# Patient Record
Sex: Male | Born: 1960
Health system: Southern US, Community
[De-identification: ages and names within clinical notes are randomized; demographics above are authoritative.]

## PROBLEM LIST (undated history)

## (undated) DIAGNOSIS — K219 Gastro-esophageal reflux disease without esophagitis: Secondary | ICD-10-CM

## (undated) DIAGNOSIS — M25512 Pain in left shoulder: Secondary | ICD-10-CM

## (undated) DIAGNOSIS — M1990 Unspecified osteoarthritis, unspecified site: Secondary | ICD-10-CM

## (undated) DIAGNOSIS — M79606 Pain in leg, unspecified: Secondary | ICD-10-CM

## (undated) DIAGNOSIS — M5416 Radiculopathy, lumbar region: Secondary | ICD-10-CM

## (undated) DIAGNOSIS — M549 Dorsalgia, unspecified: Secondary | ICD-10-CM

## (undated) DIAGNOSIS — Z789 Other specified health status: Secondary | ICD-10-CM

## (undated) DIAGNOSIS — M48061 Spinal stenosis, lumbar region without neurogenic claudication: Secondary | ICD-10-CM

## (undated) DIAGNOSIS — Z87442 Personal history of urinary calculi: Secondary | ICD-10-CM

## (undated) HISTORY — PX: TONSILLECTOMY: SUR1361

## (undated) HISTORY — PX: KNEE SURGERY: SHX244

## (undated) HISTORY — DX: Radiculopathy, lumbar region: M54.16

## (undated) HISTORY — PX: JOINT REPLACEMENT: SHX530

## (undated) HISTORY — DX: Spinal stenosis, lumbar region without neurogenic claudication: M48.061

## (undated) HISTORY — PX: OTHER SURGICAL HISTORY: SHX169

## (undated) HISTORY — DX: Pain in leg, unspecified: M79.606

## (undated) HISTORY — DX: Pain in left shoulder: M25.512

## (undated) HISTORY — PX: EYE SURGERY: SHX253

## (undated) HISTORY — DX: Dorsalgia, unspecified: M54.9

---

## 2002-07-22 ENCOUNTER — Emergency Department (HOSPITAL_COMMUNITY): Admission: EM | Admit: 2002-07-22 | Discharge: 2002-07-22 | Payer: Self-pay | Admitting: Emergency Medicine

## 2002-07-22 ENCOUNTER — Encounter: Payer: Self-pay | Admitting: Emergency Medicine

## 2004-06-19 ENCOUNTER — Ambulatory Visit (HOSPITAL_COMMUNITY): Admission: RE | Admit: 2004-06-19 | Discharge: 2004-06-19 | Payer: Self-pay | Admitting: Orthopaedic Surgery

## 2004-07-03 ENCOUNTER — Ambulatory Visit (HOSPITAL_COMMUNITY): Admission: RE | Admit: 2004-07-03 | Discharge: 2004-07-04 | Payer: Self-pay | Admitting: Orthopaedic Surgery

## 2004-11-16 ENCOUNTER — Emergency Department (HOSPITAL_COMMUNITY): Admission: AD | Admit: 2004-11-16 | Discharge: 2004-11-16 | Payer: Self-pay | Admitting: Family Medicine

## 2004-12-11 ENCOUNTER — Encounter: Admission: RE | Admit: 2004-12-11 | Discharge: 2004-12-11 | Payer: Self-pay | Admitting: Internal Medicine

## 2005-02-25 ENCOUNTER — Emergency Department (HOSPITAL_COMMUNITY): Admission: EM | Admit: 2005-02-25 | Discharge: 2005-02-25 | Payer: Self-pay | Admitting: Emergency Medicine

## 2007-05-01 ENCOUNTER — Ambulatory Visit (HOSPITAL_BASED_OUTPATIENT_CLINIC_OR_DEPARTMENT_OTHER): Admission: RE | Admit: 2007-05-01 | Discharge: 2007-05-01 | Payer: Self-pay | Admitting: Orthopedic Surgery

## 2007-06-22 HISTORY — PX: BACK SURGERY: SHX140

## 2007-09-11 ENCOUNTER — Ambulatory Visit (HOSPITAL_COMMUNITY): Admission: RE | Admit: 2007-09-11 | Discharge: 2007-09-12 | Payer: Self-pay | Admitting: Orthopedic Surgery

## 2007-09-14 ENCOUNTER — Emergency Department (HOSPITAL_COMMUNITY): Admission: EM | Admit: 2007-09-14 | Discharge: 2007-09-15 | Payer: Self-pay | Admitting: Emergency Medicine

## 2009-06-21 HISTORY — PX: BICEPS TENDON REPAIR: SHX566

## 2009-09-10 ENCOUNTER — Encounter: Admission: RE | Admit: 2009-09-10 | Discharge: 2009-09-10 | Payer: Self-pay | Admitting: Orthopaedic Surgery

## 2010-01-19 ENCOUNTER — Observation Stay (HOSPITAL_COMMUNITY): Admission: RE | Admit: 2010-01-19 | Discharge: 2010-01-20 | Payer: Self-pay | Admitting: Orthopedic Surgery

## 2010-09-04 LAB — TYPE AND SCREEN
ABO/RH(D): O NEG
Antibody Screen: NEGATIVE

## 2010-09-04 LAB — BASIC METABOLIC PANEL
GFR calc Af Amer: >60 mL/min (ref >60)
GFR calc non Af Amer: >60 mL/min (ref >60)
Glucose, Bld: 128 mg/dL — ABNORMAL HIGH (ref 70–99)
Sodium: 138 mEq/L (ref 135–145)

## 2010-09-05 LAB — BASIC METABOLIC PANEL
BUN: 16 mg/dL (ref 6–23)
Calcium: 9 mg/dL (ref 8.4–10.5)
Chloride: 108 mEq/L (ref 96–112)
Creatinine, Ser: 0.96 mg/dL (ref 0.4–1.5)
Glucose, Bld: 87 mg/dL (ref 70–99)

## 2010-09-05 LAB — DIFFERENTIAL
Basophils Relative: 0 % (ref 0–1)
Lymphocytes Relative: 24 % (ref 12–46)
Lymphs Abs: 1.5 10*3/uL (ref 0.7–4.0)
Neutro Abs: 4.2 10*3/uL (ref 1.7–7.7)
Neutrophils Relative %: 66 % (ref 43–77)

## 2010-09-05 LAB — CBC
Hemoglobin: 14.7 g/dL (ref 13.0–17.0)
Platelets: 168 10*3/uL (ref 150–400)
RBC: 4.62 MIL/uL (ref 4.22–5.81)
WBC: 6.3 10*3/uL (ref 4.0–10.5)

## 2010-09-05 LAB — SURGICAL PCR SCREEN: Staphylococcus aureus: POSITIVE — AB

## 2010-09-05 LAB — APTT: aPTT: 28 seconds (ref 24–37)

## 2010-09-05 LAB — URINALYSIS, ROUTINE W REFLEX MICROSCOPIC
Bilirubin Urine: NEGATIVE
Glucose, UA: NEGATIVE mg/dL
Hgb urine dipstick: NEGATIVE
Ketones, ur: NEGATIVE mg/dL
Protein, ur: NEGATIVE mg/dL
pH: 5.5 (ref 5.0–8.0)

## 2010-09-05 LAB — PROTIME-INR: INR: 1.07 (ref 0.00–1.49)

## 2010-11-03 NOTE — Consult Note (Signed)
NAMEKESHAWN, FIORITO NO.:  [id number]   MEDICAL RECORD NO.:  [id number]          PATIENT TYPE:  EMS   LOCATION:  ED                           FACILITY:  [facility]   PHYSICIAN:  Alvy Beal, MD    DATE OF BIRTH:  02-14-1961   DATE OF CONSULTATION:  DATE OF DISCHARGE:  09/15/2007                                 CONSULTATION   ORTHOPEDIC SURGEON OF RECORD:  Dr. Lajoyce Corners.   HISTORY:  This is a 50 year old gentleman who contacted me since I was  on-call for the group because of redness and pain in his unilateral  compartmental left knee replacement.   The patient states that 3 days ago he underwent a partial knee  replacement.  He was discharged yesterday and started doing PT today.  He states that later on the course of the evening, he started noticing  some redness in the anterior aspect of the calf and increased pain.  There is no drainage, fevers, or chills.  He contacted me by phone and I  indicated, unfortunately, since I was on call, the only thing I could  recommend if he was truly concerned, was to be seen at the emergency  room or he could contact Dr. Charlann Boxer in the morning.  Because of  progression of his symptoms, he elected to go to the emergency room.  The patient was evaluated in the [facility] ER.   PAST MEDICAL SURGICAL FAMILY SOCIAL HISTORY:  He is a nonsmoker,  nondrinker.  No known drug allergies.  Taking Lovenox, Celebrex,  Hydrocodone, Robaxin, oral iron.  He is otherwise healthy.  No  significant medical problems.   CLINICAL EXAM:  Pleasant gentleman appears stated age in no acute  distress.  He is alert and oriented x3 neurovascularly intact.  No focal  motor or sensory deficits in the lower extremity.  No shortness of breath, chest pain.  ABDOMEN:  Soft and nontender.  He has no hip or ankle pain with isolated joint range of motion.  The  knee wound is clean, dry, and intact.  There is no drainage.  No foul  odor and no significant  effusion.  There is no erythema.  He does have  some tenderness which is to be expected 3 days out from a knee surgery,  but it is not severe debilitating pain with passive or active range of  motion.   At this point in time I do not see any active orthopedic issues.  There  is no evidence of any wound infection or superficial cellulitis.  I will  allow him to return home and he will call Dr. Charlann Boxer in the morning to  discuss further plans.      Alvy Beal, MD  Electronically Signed    DDB/MEDQ  D:  09/15/2007  T:  09/15/2007  Job:  [id number]   cc:   Madlyn Frankel Charlann Boxer, M.D.  Fax: [phone]

## 2010-11-03 NOTE — Op Note (Signed)
NAMEJAMESMICHAEL, SHADD                 ACCOUNT NO.:  [id number]   MEDICAL RECORD NO.:  [id number]          PATIENT TYPE:  AMB   LOCATION:  [facility]                         FACILITY:  [facility]   PHYSICIAN:  Ollen Gross, M.D.    DATE OF BIRTH:  01-Jan-1961   DATE OF PROCEDURE:  05/01/2007  DATE OF DISCHARGE:                               OPERATIVE REPORT   PREOPERATIVE DIAGNOSIS:  Left knee medial meniscal tear and chondral  defect.   POSTOPERATIVE DIAGNOSIS.:  Left knee medial meniscal tear and chondral  defect.   PROCEDURE:  Left knee arthroscopy with meniscal debridement and  chondroplasty.   SURGEON:  Ollen Gross, M.D., no assistant.   ANESTHESIA:  General.   BLOOD LOSS:  Minimal.   DRAIN:  None.   COMPLICATIONS:  None.   CONDITION:  Stable to recovery.   CLINICAL NOTE:  Harry Cobb is a 50 year old male who has had multiple problems  with regards to his left knee.  He had had arthroscopies in the past,  the most recent being in January 2008 with Dr. Ophelia Charter.  He required  injections postoperatively and now has had progressively worsening pain  and dysfunction.  We got an MRI showing recurrent meniscal tear and  chondral irregularity in the medial compartment.  He presents now for  arthroscopy and debridement.   PROCEDURE IN DETAIL:  After successful administration of general  anesthetic, a tourniquet was placed high on the left thigh and left  lower extremity prepped and draped in the usual sterile fashion.  Standard superomedial and inferolateral incisions were made and a flow  cannula passed superomedial and camera passed inferolateral.  Arthroscopic visualization proceeds.  Undersurface of the patella looks  fairly normal.  The trochlea had some grade 2 change centrally, 1 focal  area of grade 3.  There was no unstable cartilage on the trochlea.  Medial and lateral gutters were visualized, and there appears to be a  loose body present tethered to soft tissue in the lateral  gutter.  Flexion and valgus forces then applied to the knee and the medial  compartment centered.  He has evidence of a tear in the body and  posterior horn of the medial meniscus as well as the area of exposed  bone at the medial most margin on the tibia to about a 1 x 1-cm area and  an active chondral delamination from the medial femoral condyle.  A  spinal needle is used to localize the inferomedial portal, small  incision made, dilator placed and then we debride the meniscus back to a  stable base with baskets and a 4.2-mm shaver and sealed it off with the  ArthroCare.  On the tibial surface, there was no evidence of any  unstable cartilage, just that area of exposed bone, most medial at the  joint.  The femoral side, he had about a 1 x 2-cm area where the  cartilage was coming off of bone.  I debrided this back to stable bony  base with stable cartilaginous edges and abraded the bone for hopes of  getting some fibrocartilage to form.  The compartment was again  inspected.  No other tears or loose bodies noted.  Intercondylar notch  was visualized.  The ACL looks normal.  Lateral compartment centered,  and it looks normal.  Went back up into the lateral gutter and then  identified that area again.  We then switched the camera into the medial  compartment and made the working portal lateral.  I found that loose  piece, and we were able to debride the tissue around it, and the suction  removed that piece.  ArthroCare was then used to seal off the area.  Again  inspected the undersurface of the patella and trochlear.  There  was no unstable cartilage.  The joint was again inspected, and there  were no loose bodies.  Arthroscopic equipment was then removed from the  inferior portals which were closed with interrupted 4-0 nylon.  Twenty  mL of 0.25% Marcaine with epinephrine were injected through the inflow  cannula, and then that was removed and that portal closed with nylon.  A  bulky  sterile dressing was applied, and he was awakened and transported  to recovery in stable condition.      Ollen Gross, M.D.  Electronically Signed     FA/MEDQ  D:  05/01/2007  T:  05/02/2007  Job:  [id number]

## 2010-11-03 NOTE — H&P (Signed)
NAME:  PRECILIANO, CASTELL NO.:  [id number]   MEDICAL RECORD NO.:  [id number]          PATIENT TYPE:  INP   LOCATION:                               FACILITY:  [facility]   PHYSICIAN:  Madlyn Frankel. Charlann Boxer, M.D.  DATE OF BIRTH:  Aug 06, 1960   DATE OF ADMISSION:  09/11/2007  DATE OF DISCHARGE:                              HISTORY & PHYSICAL   PROCEDURE:  Left unicondylar knee replacement.   CHIEF COMPLAINT:  Left knee pain.   HISTORY OF PRESENT ILLNESS:  A 50 year old male with a history of left  medial knee pain secondary to medial compartment osteoarthritis.  It has  been refractory to conservative treatment with a history of multiple  arthroscopic surgeries, oral anti-inflammatories, cortisone injections,  and supplementation.  He has been pre-surgically cleared for partial  knee replacement.   PAST MEDICAL HISTORY:  Significant for osteoarthritis.   PAST SURGICAL HISTORIES:  Multiple left knee scopes.  Back surgery and  sinus surgery.  ACL reconstruction right knee.   FAMILY HISTORY:  Noncontributory.   SOCIAL HISTORY:  Married.  Primary caregiver will be his spouse after  surgery.   DRUG ALLERGIES:  No known drug allergies.   MEDICATIONS:  Celebrex 200 mg 1 p.o. b.i.d. for 2 weeks after surgery.   REVIEW OF SYSTEMS:  None other than HPI.   PHYSICAL EXAMINATION:  Pulse 72, respirations 18, blood pressure 130/80.  GENERAL:  Awake, alert and oriented, well-developed, well-nourished in  no acute distress.  NECK:  Supple.  No carotid bruits.  CHEST/LUNGS:  Clear to auscultation bilaterally.  BREASTS:  Deferred.  HEART:  Regular rate and rhythm.  S1-S2 distinct.  ABDOMEN:  Soft, nontender, nondistended.  Bowel sounds present.  GENITOURINARY:  Deferred.  EXTREMITIES:  Dorsalis pedis pulse positive.  Left lower extremity with  medial sided tenderness.  SKIN:  No cellulitis.  NEUROLOGIC:  Intact distal sensibilities.   LABS:  EKG, chest x-ray all pending presurgical  testing.   IMPRESSION:  Left medial knee osteoarthritis.   PLAN OF ACTION:  Left unicondylar knee replacement by surgeon Dr.  Durene Romans at [facility] September 11, 2007.  Risks and  complications were discussed.   Postoperative medications including Lovenox Robaxin iron, aspirin,  Colace, MiraLax provided.  Celebrex presurgical treatment provided as  well as continued use postoperatively.  Pain medicines be provided at  the time of surgery.     ______________________________  Yetta Glassman [address], [place]      Madlyn Frankel. Charlann Boxer, M.D.  Electronically Signed    BLM/MEDQ  D:  09/04/2007  T:  09/04/2007  Job:  [id number]

## 2010-11-03 NOTE — Op Note (Signed)
Harry Cobb, Harry Cobb                 ACCOUNT NO.:  [id number]   MEDICAL RECORD NO.:  [id number]          PATIENT TYPE:  INP   LOCATION:  0003                         FACILITY:  [facility]   PHYSICIAN:  Madlyn Frankel. Charlann Boxer, M.D.  DATE OF BIRTH:  06/06/1961   DATE OF PROCEDURE:  09/11/2007  DATE OF DISCHARGE:                               OPERATIVE REPORT   PREOPERATIVE DIAGNOSIS:  Left knee medial compartment osteoarthritis.   POSTOPERATIVE DIAGNOSIS:  Left knee medial compartment osteoarthritis.  There was noted to be some grade II to III changes in the trochlear  surface.  Patella appeared to be intact.   PROCEDURE:  Left knee partial knee replacement, Biomet Oxford knee size  medium femur, B medial left tibia and a size 4 insert.   SURGEON:  Madlyn Frankel. Charlann Boxer, M.D.   ASSISTANT:  Yetta Glassman. Mann, PA.   ANESTHESIA:  Duramorph spinal.   BLOOD LOSS:  Minimal.   TOURNIQUET TIME:  60 minutes at 250 mmHg.   DRAINS:  One.   COMPLICATIONS:  None.   INDICATIONS FOR PROCEDURE:  Tayson is a 50 year old male who had a history  of left knee problems including previous two arthroscopies with medial  meniscectomy, persistent problems with findings consistent with  subchondral edematous reaction with collapse and progressive arthritic  problems.  Radiographs revealed that he was a candidate for partial knee  replacement with predominant medial compartment arthritis, his age,  activity level and desired to have a normal feeling knee.  We discussed  the risks and benefits of knee replacement surgery including potential  progression of arthritis, the need for revision to a total knee  replacement some time down the road.  Given the desired activity level,  partial knee replacement with maintenance of his ligaments offered him  the best opportunity to remain active, but eliminating the primary  source of the discomfort.  Risks and benefits were discussed and consent  obtained.   PROCEDURE IN DETAIL:   The patient was brought to the operative theater.  Once adequate anesthesia was established and Foley placed, the left leg  was placed into a proximal thigh tourniquet.  The leg holder was then  positioned and landmarks identified.  The left lower extremity was pre  scrubbed, prepped and draped in a sterile fashion.  Leg was  exsanguinated and tourniquet elevated 250 mmHg.  Para midline incision  was made from the patella to the tubercle.  Sharp dissection was carried  down to the extensor mechanism and skin flaps created.  Partial  arthrotomy was created and debridement carried out including partial  medial meniscectomy.  Retractors were placed at this point and I was  able to debride osteophytes off the medial femur as well as in the  notch.  Also evaluated the knee cap area, debrided a little bit of  medial osteophytes off the patella, recognized some grade II to III  changes probably trochlear and patella was relatively intact.   At this point the extramedullary guide was then placed and pinned into  position.  Using the reciprocating saw into the notch, aimed  towards the  hip center.  Then used an oscillating saw to remove the bone cut.  The  bone wear pattern was noted to be consistent with anterior medial wear  pattern consistent with that with an intact ACL.  There was exposed  bone.   I then checked with a size B tibial tray which fit nicely on the cut  surface and the size 4 insert passed with the appropriate amount of  tension with the knee in 90 degrees of flexion without retractors in  place.  At this point I placed the intramedullary rod using the starting  awl to create a hole a centimeter above the PCL insertion medially.  I  then passed the intramedullary rod.  I then placed the B tray with the 3  insert and cut in the guide for the posterior cutting jig.  Once this  was parallel in all planes, I drilled it into position.  The posterior  cutting block was then placed  and the posterior cut was made off the  posterior aspect of the femur.   At this point we put the 0 spigot on and milled down the femur.  Trial  was carried out with a size medium femur, B tibia and a 4 insert.  The  knee felt good at 90 degrees of flexion without retractors.  Had a hard  time getting the 1 into place with the knee at 20 degrees of extension.  Based on this, I chose to use a 4 spigot.  The trials were removed and  the 4 spigot placed and I milled the femur.  After removing osteophytes  and remaining bone off the medial and lateral aspects of the medial  femoral condyle, I placed the trial medium femur, B tibia and the 4  insert.  At this point the ligaments appeared to be very balanced from  extension to flexion.   At this point I removed the bone off the anterior aspect of the medial  femoral condyle just above the milling area to allow for the movement of  the polyethylene.   At this point the final preparation of the tibia was carried out holding  the tibial tray into position.  I pinned it in place.  I then used the  reciprocating saw to create the trough.  I then used the gouge to clean  out the trough and inflate the keeled trial component.  With the trial  that mimicked the final component, there was a couple millimeters of  play at 90 degrees of flexion and 20 degrees of extension indicating  good balance of the knee.  At this point all trial components were  removed.  I debrided remaining bone as necessary, drilled the distal  femur for sclerotic bone present.  Cement was mixed in two batches.  The  tibial component was cemented in first followed by the femur in two  separate technique.  After each technique, I was able to remove the  remaining cement off the posterior aspect of the knee with good  visualization.  Once the cementing technique was carried out again with  two cement batches, I retrialed with the 4 and was happy with it.  The  final 4 insert was  then placed.  I opened it in place.  It was a 4  medial left insert.  It was placed with good pressure.   I reirrigated the knee out at this point.  I did inject the synovial  capsule layer  with 50 mL of 0.25% Marcaine with epinephrine and 1 mL of  Toradol.  A medium Hemovac drain was placed deep.  I irrigated the knee  out and further debridement was carried out as necessary.  At this point  the extensor mechanism was reapproximated with #1 Vicryl.  The remaining  wound was closed with 2-0 Vicryl and running 4-0 Monocryl.  The knee was  cleaned, dried, and dressed sterilely with Steri-Strips and a sterile  bulky wrap.  He was brought to the recovery room in stable condition.  He tolerated the procedure well.      Madlyn Frankel Charlann Boxer, M.D.  Electronically Signed    MDO/MEDQ  D:  09/11/2007  T:  09/11/2007  Job:  [id number]

## 2010-11-06 NOTE — Op Note (Signed)
NAMEASCENCION, COYE                 ACCOUNT NO.:  [id number]   MEDICAL RECORD NO.:  [id number]          PATIENT TYPE:  OIB   LOCATION:  NA                           FACILITY:  [facility]   PHYSICIAN:  Mark C. Ophelia Charter, M.D.    DATE OF BIRTH:  07/01/1960   DATE OF PROCEDURE:  07/03/2004  DATE OF DISCHARGE:                                 OPERATIVE REPORT   PREOPERATIVE DIAGNOSIS:  Left L5-S1 herniated nucleus pulposus.   POSTOPERATIVE DIAGNOSIS:  Left L5-S1 herniated nucleus pulposus.   PROCEDURE:  Left L5-S1 microdiskectomy.   SURGEON:  Mark C. Ophelia Charter, M.D.   ASSISTANT:  Worthy Flank, RNFA   ANESTHESIA:  GOT.   ESTIMATED BLOOD LOSS:  Less than 100 mL.   DRAINS:  None.   DESCRIPTION OF PROCEDURE:  After induction of general anesthesia the patient  was placed on the Andrews frame with preoperative Ancef, kneeling position,  careful padding, cross-table lateral x-ray after DuraPrep. Area was isolated  with towels, Betadine and Vi-Drape and laminectomy sheet was applied.  X-ray  showed that the needle was just barely below the space. Incision was started  at the needle on the left and extended proximally.   Subperiosteal dissection was performed.  A laminotomy was performed at L5.  There was some slight facet overhang, and a small amount of the lamina of S1  was removed.  There was some hypertrophic ligament in the gutter and nerve  root was immediately identified.  It was dorsally displaced and there was a  large disk herniation which was retained by the ligament with the large  bulge.  Annulus was incised with a scalpel on the posterior longitudinal  ligament and immediately giant chunks of herniated nucleus pulposus was seen  with the D'Errico used to retract the nerve root.  Big chunks of disk were  removed.  There was some endplate spurring, more prominent laterally, and  these ridges had to be removed with Kerrison as well as a straight-biting  pituitary.  An Gwyneth Sprout was used in  the midline and also laterally to help  push some degenerative material in.  The disk space had collapsed  considerably and only the micropituitary would fit in the disk space.  There  was mild ridging at the midline and significant ridging was on the left  lateral side which were removed until the spurs were gone and the disk space  and vertebral body above and below were flat for a floor of the head and no  ridging and spurring.  This gave good decompression of the nerve root.  Palpation out the foramina and in the midline was performed.  Other than  some mild ridging of the endplate at the midline, there were no remaining  areas of concern.  Some epidural veins were  coagulated.  The disk space was irrigated, and deep fascia closed with #0  Vicryl, 2-0 Vicryl in the subcutaneous tissue; subcuticular skin closure,  tincture of Benzoin, and Steri-Strips.  Postop dressing applied and the  patient was transferred to the recovery room.  Instrument count and needle  count were correct.       MCY/MEDQ  D:  07/03/2004  T:  07/03/2004  Job:  [id number]

## 2010-12-24 ENCOUNTER — Encounter (INDEPENDENT_AMBULATORY_CARE_PROVIDER_SITE_OTHER): Payer: Self-pay | Admitting: General Surgery

## 2010-12-24 ENCOUNTER — Ambulatory Visit (INDEPENDENT_AMBULATORY_CARE_PROVIDER_SITE_OTHER): Payer: PRIVATE HEALTH INSURANCE | Admitting: General Surgery

## 2010-12-24 VITALS — BP 114/62 | HR 79 | Temp 98.6°F | Ht 71.0 in | Wt 211.0 lb

## 2010-12-24 DIAGNOSIS — K429 Umbilical hernia without obstruction or gangrene: Secondary | ICD-10-CM

## 2010-12-24 NOTE — Progress Notes (Signed)
Addended byEmelia Loron on: 12/24/2010 12:19 PM   Modules accepted: Level of Service

## 2010-12-24 NOTE — Progress Notes (Signed)
Subjective:     Patient ID: Stylianos Stradling, male   DOB: 09/19/1960, 50 y.o.   MRN: [id number]    BP 114/62  Pulse 79  Temp(Src) 98.6 F (37 C) (Temporal)  Ht 5\' 11"  (1.803 m)  Wt 211 lb (95.709 kg)  BMI 29.43 kg/m2    HPI This is a 50 year old healthy male who's had numerous orthopedic surgeries. He presents with about a 4-5 year history of an umbilical hernia. This is remained the same size over that time period he has begun to bother him and cause some pain when he is doing activity. He is an avid golfer and this is when it mostly is affecting him. There's no other real relieving factors except for rest. He comes in today to have this evaluated due to the fact that he 7 increasing symptoms from this. He also has a right shoulder mass that has been there for several years. He says is somewhat more noticeable. There is no pain or any symptoms related to this. He has no history of any infection. No real relieving or aggravating factors exertional symptoms associated with it. He also asked about having this area removed as well.  History reviewed. No pertinent past medical history.  Past Surgical History  Procedure Date  . Knee surgery 2009/2011    Rt and Lt knee replacment  . Back surgery 2009  . Biceps tendon repair 2011    No current outpatient prescriptions on file.  No Known Allergies   Review of Systems  Constitutional: Negative.   HENT: Negative.   Respiratory: Negative.   Cardiovascular: Negative.   Gastrointestinal: Negative.   Genitourinary: Negative.   Musculoskeletal: Positive for arthralgias.  Neurological: Negative.   Hematological: Negative.   Psychiatric/Behavioral: Negative.        Objective:   Physical Exam  Constitutional: He appears well-developed and well-nourished.  Cardiovascular: Normal rate, regular rhythm and normal heart sounds.   Pulmonary/Chest: Effort normal. He has no wheezes. He has no rales.       Right posterior shoulder 1x1.5 cm  soft, mobile mass consistent with lipoma  Abdominal: Soft. Bowel sounds are normal. He exhibits no mass. There is no tenderness. A hernia (mildly tender nonreducible umbilical hernia) is present.       Assessment:     Umbilical hernia Right shoulder lipoma    Plan:        First we discussed the right shoulder lipoma. A series not bothering him. I told him that it would be fine just delete this and monitor this over time he was very happy with that.  We also discussed a symptomatic umbilical hernia. I discussed an open umbilical hernia repair likely with mesh. We discussed the risk being but not limited to bleeding, infection, recurrence and seroma. I discussed with him being 3 weeks without any golf or strenuous activity. He would like to do this sometime after he has a couple of gallstones to play later this summer or fall. I think that will be fine given the chronicity of this hernia. He did say he would call me sooner if he developed any more symptoms or any problems.

## 2010-12-24 NOTE — Patient Instructions (Signed)
Call to schedule surgery.  Call sooner if any problems or additional questions.

## 2011-02-25 ENCOUNTER — Telehealth (INDEPENDENT_AMBULATORY_CARE_PROVIDER_SITE_OTHER): Payer: Self-pay | Admitting: General Surgery

## 2011-02-26 ENCOUNTER — Telehealth (INDEPENDENT_AMBULATORY_CARE_PROVIDER_SITE_OTHER): Payer: Self-pay | Admitting: General Surgery

## 2011-02-26 NOTE — Telephone Encounter (Signed)
Patient's wife called re: recovery time after umbilical hernia repair and how early in the week to schedule surgery to be able to travel on Saturday to a football game. I advised the wife that everyone heals differently and there is no guarantee that he will feel like traveling right after surgery. I advised if he did, to schedule surgery for as early in the week as possible and to make sure and get out and walk around half way through the trip. She was grateful for the advise and she will call Amy to schedule surgery when they are ready.

## 2011-03-15 LAB — DIFFERENTIAL
Basophils Absolute: 0
Basophils Relative: 1
Basophils Relative: 1
Eosinophils Absolute: 0.2
Eosinophils Relative: 6 — ABNORMAL HIGH
Lymphocytes Relative: 37
Lymphocytes Relative: 38
Lymphs Abs: 2
Monocytes Relative: 7
Neutrophils Relative %: 50

## 2011-03-15 LAB — URINALYSIS, ROUTINE W REFLEX MICROSCOPIC
Glucose, UA: NEGATIVE
Hgb urine dipstick: NEGATIVE
Ketones, ur: NEGATIVE
Protein, ur: NEGATIVE
Specific Gravity, Urine: 1.024

## 2011-03-15 LAB — CBC
HCT: 38.6 — ABNORMAL LOW
MCHC: 35.4
MCV: 87.9
Platelets: 130 — ABNORMAL LOW
Platelets: 154
Platelets: 174
RDW: 12.4
RDW: 13
RDW: 13.2
WBC: 5
WBC: 5.3

## 2011-03-15 LAB — BASIC METABOLIC PANEL
BUN: 13
BUN: 15
BUN: 16
CO2: 28
Calcium: 7.8 — ABNORMAL LOW
Calcium: 8.9
Chloride: 104
Creatinine, Ser: 0.94
GFR calc Af Amer: >60
GFR calc Af Amer: >60
GFR calc non Af Amer: >60
GFR calc non Af Amer: >60
Glucose, Bld: 122 — ABNORMAL HIGH
Glucose, Bld: 137 — ABNORMAL HIGH
Potassium: 3.6
Potassium: 4
Sodium: 137

## 2011-03-15 LAB — TYPE AND SCREEN
ABO/RH(D): O NEG
Antibody Screen: NEGATIVE

## 2011-03-15 LAB — APTT: aPTT: 27

## 2011-03-30 LAB — POCT HEMOGLOBIN-HEMACUE: Operator id: 268271

## 2011-04-22 HISTORY — PX: KNEE ARTHROSCOPY: SUR90

## 2011-04-28 ENCOUNTER — Other Ambulatory Visit: Payer: Self-pay | Admitting: Rheumatology

## 2011-04-28 ENCOUNTER — Ambulatory Visit
Admission: RE | Admit: 2011-04-28 | Discharge: 2011-04-28 | Disposition: A | Discharge status: Home / Self Care | Payer: PRIVATE HEALTH INSURANCE | Source: Ambulatory Visit | Attending: Rheumatology | Admitting: Rheumatology

## 2011-04-28 DIAGNOSIS — M2550 Pain in unspecified joint: Secondary | ICD-10-CM

## 2011-06-01 ENCOUNTER — Encounter (HOSPITAL_BASED_OUTPATIENT_CLINIC_OR_DEPARTMENT_OTHER): Payer: Self-pay | Admitting: *Deleted

## 2011-06-01 ENCOUNTER — Other Ambulatory Visit (INDEPENDENT_AMBULATORY_CARE_PROVIDER_SITE_OTHER): Payer: Self-pay | Admitting: General Surgery

## 2011-06-01 NOTE — Progress Notes (Signed)
No labs per anesth needed-waiting to see if dr Dwain Sarna wants labs

## 2011-06-03 ENCOUNTER — Encounter (HOSPITAL_BASED_OUTPATIENT_CLINIC_OR_DEPARTMENT_OTHER)
Admission: RE | Admit: 2011-06-03 | Discharge: 2011-06-03 | Disposition: A | Discharge status: Home / Self Care | Payer: PRIVATE HEALTH INSURANCE | Source: Ambulatory Visit | Attending: General Surgery | Admitting: General Surgery

## 2011-06-03 LAB — BASIC METABOLIC PANEL
BUN: 25 mg/dL — ABNORMAL HIGH (ref 6–23)
Calcium: 9 mg/dL (ref 8.4–10.5)
Creatinine, Ser: 0.82 mg/dL (ref 0.50–1.35)
GFR calc Af Amer: >90 mL/min (ref >90)
GFR calc non Af Amer: >90 mL/min (ref >90)
Glucose, Bld: 96 mg/dL (ref 70–99)
Potassium: 4.4 mEq/L (ref 3.5–5.1)

## 2011-06-03 LAB — CBC
Hemoglobin: 14.3 g/dL (ref 13.0–17.0)
MCH: 31.2 pg (ref 26.0–34.0)
MCHC: 35.3 g/dL (ref 30.0–36.0)
Platelets: 140 10*3/uL — ABNORMAL LOW (ref 150–400)
RDW: 12.7 % (ref 11.5–15.5)

## 2011-06-04 ENCOUNTER — Ambulatory Visit (HOSPITAL_BASED_OUTPATIENT_CLINIC_OR_DEPARTMENT_OTHER)
Admission: RE | Admit: 2011-06-04 | Discharge: 2011-06-04 | Disposition: A | Discharge status: Home / Self Care | Payer: PRIVATE HEALTH INSURANCE | Source: Ambulatory Visit | Attending: General Surgery | Admitting: General Surgery

## 2011-06-04 ENCOUNTER — Encounter (HOSPITAL_BASED_OUTPATIENT_CLINIC_OR_DEPARTMENT_OTHER): Payer: Self-pay | Admitting: Certified Registered Nurse Anesthetist

## 2011-06-04 ENCOUNTER — Ambulatory Visit (HOSPITAL_BASED_OUTPATIENT_CLINIC_OR_DEPARTMENT_OTHER): Payer: PRIVATE HEALTH INSURANCE | Admitting: Certified Registered Nurse Anesthetist

## 2011-06-04 ENCOUNTER — Other Ambulatory Visit (INDEPENDENT_AMBULATORY_CARE_PROVIDER_SITE_OTHER): Payer: Self-pay | Admitting: General Surgery

## 2011-06-04 ENCOUNTER — Encounter (HOSPITAL_BASED_OUTPATIENT_CLINIC_OR_DEPARTMENT_OTHER)
Admission: RE | Disposition: A | Discharge status: Home / Self Care | Payer: Self-pay | Source: Ambulatory Visit | Attending: General Surgery

## 2011-06-04 ENCOUNTER — Other Ambulatory Visit: Payer: Self-pay

## 2011-06-04 ENCOUNTER — Encounter (HOSPITAL_BASED_OUTPATIENT_CLINIC_OR_DEPARTMENT_OTHER): Payer: Self-pay | Admitting: *Deleted

## 2011-06-04 DIAGNOSIS — D1739 Benign lipomatous neoplasm of skin and subcutaneous tissue of other sites: Secondary | ICD-10-CM | POA: Insufficient documentation

## 2011-06-04 DIAGNOSIS — Z01812 Encounter for preprocedural laboratory examination: Secondary | ICD-10-CM | POA: Insufficient documentation

## 2011-06-04 DIAGNOSIS — K420 Umbilical hernia with obstruction, without gangrene: Secondary | ICD-10-CM | POA: Insufficient documentation

## 2011-06-04 DIAGNOSIS — K429 Umbilical hernia without obstruction or gangrene: Secondary | ICD-10-CM

## 2011-06-04 HISTORY — DX: Other specified health status: Z78.9

## 2011-06-04 HISTORY — PX: OTHER SURGICAL HISTORY: SHX169

## 2011-06-04 HISTORY — PX: UMBILICAL HERNIA REPAIR: SHX196

## 2011-06-04 HISTORY — DX: Unspecified osteoarthritis, unspecified site: M19.90

## 2011-06-04 SURGERY — REPAIR, HERNIA, UMBILICAL, ADULT
Anesthesia: General | Site: Abdomen | Wound class: Clean

## 2011-06-04 MED ORDER — HYDROMORPHONE HCL PF 1 MG/ML IJ SOLN
0.2500 mg | INTRAMUSCULAR | Status: DC | PRN
  Administered 2011-06-04: 0.25 mg via INTRAVENOUS
  Administered 2011-06-04: 0.5 mg via INTRAVENOUS
  Administered 2011-06-04: 0.25 mg via INTRAVENOUS

## 2011-06-04 MED ORDER — LIDOCAINE HCL (CARDIAC) 20 MG/ML IV SOLN
INTRAVENOUS | Status: DC | PRN
  Administered 2011-06-04: 60 mg via INTRAVENOUS

## 2011-06-04 MED ORDER — CEFAZOLIN SODIUM-DEXTROSE 2-3 GM-% IV SOLR
2.0000 g | INTRAVENOUS | Status: AC
  Administered 2011-06-04: 2 g via INTRAVENOUS

## 2011-06-04 MED ORDER — OXYCODONE-ACETAMINOPHEN 5-325 MG PO TABS
2.0000 | ORAL_TABLET | Freq: Once | ORAL | Status: AC
  Administered 2011-06-04: 2 via ORAL

## 2011-06-04 MED ORDER — FENTANYL CITRATE 0.05 MG/ML IJ SOLN
INTRAMUSCULAR | Status: DC | PRN
  Administered 2011-06-04: 100 ug via INTRAVENOUS
  Administered 2011-06-04: 50 ug via INTRAVENOUS

## 2011-06-04 MED ORDER — MIDAZOLAM HCL 5 MG/5ML IJ SOLN
INTRAMUSCULAR | Status: DC | PRN
  Administered 2011-06-04: 2 mg via INTRAVENOUS

## 2011-06-04 MED ORDER — CEFAZOLIN SODIUM 1-5 GM-% IV SOLN: 1.0000 g | INTRAVENOUS | Status: DC

## 2011-06-04 MED ORDER — OXYCODONE-ACETAMINOPHEN 10-325 MG PO TABS: 1.0000 | ORAL_TABLET | Freq: Four times a day (QID) | ORAL | Status: AC | PRN

## 2011-06-04 MED ORDER — ONDANSETRON HCL 4 MG/2ML IJ SOLN: 4.0000 mg | Freq: Once | INTRAMUSCULAR | Status: DC | PRN

## 2011-06-04 MED ORDER — DEXAMETHASONE SODIUM PHOSPHATE 4 MG/ML IJ SOLN
INTRAMUSCULAR | Status: DC | PRN
  Administered 2011-06-04: 10 mg via INTRAVENOUS

## 2011-06-04 MED ORDER — LACTATED RINGERS IV SOLN
INTRAVENOUS | Status: DC
  Administered 2011-06-04 (×2): via INTRAVENOUS

## 2011-06-04 MED ORDER — MEPERIDINE HCL 25 MG/ML IJ SOLN: 6.2500 mg | INTRAMUSCULAR | Status: DC | PRN

## 2011-06-04 MED ORDER — ONDANSETRON HCL 4 MG/2ML IJ SOLN
INTRAMUSCULAR | Status: DC | PRN
  Administered 2011-06-04: 4 mg via INTRAVENOUS

## 2011-06-04 MED ORDER — BUPIVACAINE HCL (PF) 0.25 % IJ SOLN
INTRAMUSCULAR | Status: DC | PRN
  Administered 2011-06-04: 10 mL

## 2011-06-04 SURGICAL SUPPLY — 44 items
BENZOIN TINCTURE PRP APPL 2/3 (GAUZE/BANDAGES/DRESSINGS) ×2 IMPLANT
BLADE SURG 15 STRL LF DISP TIS (BLADE) ×1 IMPLANT
BLADE SURG 15 STRL SS (BLADE) ×1
BLADE SURG ROTATE 9660 (MISCELLANEOUS) ×2 IMPLANT
CHLORAPREP W/TINT 26ML (MISCELLANEOUS) ×2 IMPLANT
CLOTH BEACON ORANGE TIMEOUT ST (SAFETY) ×2 IMPLANT
COVER MAYO STAND STRL (DRAPES) ×2 IMPLANT
COVER TABLE BACK 60X90 (DRAPES) ×2 IMPLANT
DECANTER SPIKE VIAL GLASS SM (MISCELLANEOUS) IMPLANT
DERMABOND ADVANCED (GAUZE/BANDAGES/DRESSINGS) ×1
DERMABOND ADVANCED .7 DNX12 (GAUZE/BANDAGES/DRESSINGS) ×1 IMPLANT
DRAPE PED LAPAROTOMY (DRAPES) ×2 IMPLANT
DRSG TEGADERM 4X4.75 (GAUZE/BANDAGES/DRESSINGS) ×2 IMPLANT
ELECT COATED BLADE 2.86 ST (ELECTRODE) ×2 IMPLANT
ELECT REM PT RETURN 9FT ADLT (ELECTROSURGICAL) ×2
ELECTRODE REM PT RTRN 9FT ADLT (ELECTROSURGICAL) ×1 IMPLANT
GLOVE BIO SURGEON STRL SZ 6.5 (GLOVE) ×2 IMPLANT
GLOVE BIO SURGEON STRL SZ7 (GLOVE) ×2 IMPLANT
GLOVE BIOGEL PI IND STRL 7.0 (GLOVE) ×1 IMPLANT
GLOVE BIOGEL PI IND STRL 7.5 (GLOVE) ×1 IMPLANT
GLOVE BIOGEL PI INDICATOR 7.0 (GLOVE) ×1
GLOVE BIOGEL PI INDICATOR 7.5 (GLOVE) ×1
GOWN PREVENTION PLUS XLARGE (GOWN DISPOSABLE) ×4 IMPLANT
NEEDLE HYPO 22GX1.5 SAFETY (NEEDLE) ×2 IMPLANT
NS IRRIG 1000ML POUR BTL (IV SOLUTION) ×2 IMPLANT
PACK BASIN DAY SURGERY FS (CUSTOM PROCEDURE TRAY) ×2 IMPLANT
PATCH VENTRAL SMALL 4.3 (Mesh Specialty) ×2 IMPLANT
PENCIL BUTTON HOLSTER BLD 10FT (ELECTRODE) ×2 IMPLANT
SLEEVE SCD COMPRESS KNEE MED (MISCELLANEOUS) ×2 IMPLANT
SPONGE GAUZE 2X2 8PLY STRL LF (GAUZE/BANDAGES/DRESSINGS) ×2 IMPLANT
SPONGE LAP 4X18 X RAY DECT (DISPOSABLE) ×2 IMPLANT
STRIP CLOSURE SKIN 1/2X4 (GAUZE/BANDAGES/DRESSINGS) ×2 IMPLANT
SUT ETHIBOND 0 MO6 C/R (SUTURE) ×2 IMPLANT
SUT MNCRL AB 4-0 PS2 18 (SUTURE) ×2 IMPLANT
SUT VIC AB 0 SH 27 (SUTURE) IMPLANT
SUT VIC AB 2-0 SH 27 (SUTURE)
SUT VIC AB 2-0 SH 27XBRD (SUTURE) IMPLANT
SUT VIC AB 3-0 SH 27 (SUTURE) ×1
SUT VIC AB 3-0 SH 27X BRD (SUTURE) ×1 IMPLANT
SUT VICRYL AB 3 0 TIES (SUTURE) IMPLANT
SYR CONTROL 10ML LL (SYRINGE) ×2 IMPLANT
TOWEL OR 17X24 6PK STRL BLUE (TOWEL DISPOSABLE) ×2 IMPLANT
TOWEL OR NON WOVEN STRL DISP B (DISPOSABLE) ×2 IMPLANT
WATER STERILE IRR 1000ML POUR (IV SOLUTION) IMPLANT

## 2011-06-04 NOTE — Anesthesia Procedure Notes (Signed)
Procedure Name: LMA Insertion Date/Time: 06/04/2011 7:48 AM Performed by: Jonan Seufert D Pre-anesthesia Checklist: Patient identified, Emergency Drugs available, Suction available and Patient being monitored Patient Re-evaluated:Patient Re-evaluated prior to inductionOxygen Delivery Method: Circle System Utilized Preoxygenation: Pre-oxygenation with 100% oxygen Intubation Type: IV induction Ventilation: Mask ventilation without difficulty LMA: LMA with gastric port inserted LMA Size: 4.0 Number of attempts: 1 Placement Confirmation: positive ETCO2 Tube secured with: Tape Dental Injury: Teeth and Oropharynx as per pre-operative assessment

## 2011-06-04 NOTE — Transfer of Care (Signed)
Immediate Anesthesia Transfer of Care Note  Patient: Harry Cobb  Procedure(s) Performed:  HERNIA REPAIR UMBILICAL ADULT - umbilical hernia repiar and mesh and removal of lympoma on right shoulder  Patient Location: PACU  Anesthesia Type: General  Level of Consciousness: awake, alert , oriented and patient cooperative  Airway & Oxygen Therapy: Patient Spontanous Breathing and Patient connected to face mask oxygen  Post-op Assessment: Report given to PACU RN and Post -op Vital signs reviewed and stable  Post vital signs: Reviewed and stable  Complications: No apparent anesthesia complications

## 2011-06-04 NOTE — Anesthesia Preprocedure Evaluation (Addendum)
Anesthesia Evaluation  Patient identified by MRN, date of birth, ID band Patient awake    Reviewed: Allergy & Precautions, H&P , NPO status , Patient's Chart, lab work & pertinent test results  Airway Mallampati: II TM Distance: >3 FB Neck ROM: full    Dental   Pulmonary          Cardiovascular     Neuro/Psych    GI/Hepatic   Endo/Other    Renal/GU      Musculoskeletal   Abdominal   Peds  Hematology   Anesthesia Other Findings   Reproductive/Obstetrics                          Anesthesia Physical Anesthesia Plan  ASA: II  Anesthesia Plan: General LMA   Post-op Pain Management:    Induction:   Airway Management Planned:   Additional Equipment:   Intra-op Plan:   Post-operative Plan:   Informed Consent: I have reviewed the patients History and Physical, chart, labs and discussed the procedure including the risks, benefits and alternatives for the proposed anesthesia with the patient or authorized representative who has indicated his/her understanding and acceptance.     Plan Discussed with: CRNA and Surgeon  Anesthesia Plan Comments:         Anesthesia Quick Evaluation  

## 2011-06-04 NOTE — Op Note (Signed)
Preoperative diagnosis: #1 symptomatic umbilical hernia repair  #2 a 2 x 2 centimeters subcutaneous right shoulder lipoma Postoperative diagnosis: Same as above  Procedure: #1 umbilical hernia repair with proceed ventral patch, 4.4 cm #2 excision of right shoulder lipoma  Surgeon: Dr. Harden Mo Anesthesia: Gen. With LMA Estimated blood loss: Minimal Consultations: None Specimens: Right shoulder lipoma to pathology Position of patient to recovery room in stable condition Sponge needle counts were correct x2 at end of operation  Indications: This is a 50 year old male who is at a symptomatic enlarging umbilical hernia. We discussed repair with mesh. He also has we could excise the right shoulder lipoma at the same time. This area has been enlarging to plan on doing both at the same time.   procedure: After informed consent was obtained the patient was taken to the operating room.He had 2 g of intravenous cefazolin administered.sequential compression devices were placed on his legs prior to induction of anesthesia. He was administered general anesthesia with an LMA. His abdomen was then prepped and draped in standard sterile surgical fashion. A surgical timeout was performed.  I then made a curvilinear incision below his umbilicus and carried this down to the umbilical stalk. This does have some preperitoneal fat incarcerated in it. This was then divided with cautery. I then reduced the hernia in its entirety. This was about a 1.2 cm defect. Due to his body habitus I elected to place a piece of mesh for repair. I used the procedure ventral patch in place this into the preperitoneal space and laid the bottom portion of the mesh flat. This was then secured with 0 Ethibond suture. I then closed the defect overlying this with 0 Ethibond sutures well. This completely obliterated the defect. I then tacked his umbilicus now with undyed 2-0 Vicryl. I then closes with 3-0 Vicryl and 4 Monocryl.  Steri-Strips and sterile dressing were placed. I infiltrated a total of 10 cc of quarter percent Marcaine throughout this incision.  We then broke to sit down and reprepped and draped his right shoulder. I made a incision overlying the right shoulder lipoma. Cautery was then used to remove this mass in its entirety. This was then passed off the table as a specimen. Hemostasis was observed. I infiltrated again at 10 cc of quarter percent Marcaine. This was closed with 3-0 Vicryl 4 Monocryl and Dermabond. He was awakened in the operating room and transferred to recovery in stable condition.

## 2011-06-04 NOTE — H&P (Signed)
Harry Cobb is an 50 y.o. male.   Chief Complaint: Umbilical hernia, right shoulder lipoma HPI:  44 yom who has symptomatic umbilical bulge and an enlarging right shoulder lipoma.  Both of these are enlarging and beginning to bother him.  Past Medical History  Diagnosis Date  . No pertinent past medical history   . Arthritis     Past Surgical History  Procedure Date  . Knee surgery 2009/2011    Rt and Lt knee replacment  . Back surgery 2009  . Biceps tendon repair 2011  . Knee arthroscopy 11/12    lt  . Joint replacement     both partial knee replacements    History reviewed. No pertinent family history. Social History:  reports that he has never smoked. He has never used smokeless tobacco. He reports that he drinks alcohol. His drug history not on file.  Allergies: No Known Allergies  Medications Prior to Admission  Medication Dose Route Frequency Provider Last Rate Last Dose  . ceFAZolin (ANCEF) IVPB 2 g/50 mL premix  2 g Intravenous 60 min Pre-Op Emelia Loron, MD      . lactated ringers infusion   Intravenous Continuous Constance Goltz, MD 10 mL/hr at 06/04/11 [phone]    . DISCONTD: ceFAZolin (ANCEF) IVPB 1 g/50 mL premix  1 g Intravenous 60 min Pre-Op Emelia Loron, MD       No current outpatient prescriptions on file as of 06/04/2011.    Results for orders placed during the hospital encounter of 06/04/11 (from the past 48 hour(s))  BASIC METABOLIC PANEL     Status: Abnormal   Collection Time   06/03/11 11:00 AM      Component Value Range Comment   Sodium 139  135 - 145 (mEq/L)    Potassium 4.4  3.5 - 5.1 (mEq/L)    Chloride 106  96 - 112 (mEq/L)    CO2 25  19 - 32 (mEq/L)    Glucose, Bld 96  70 - 99 (mg/dL)    BUN 25 (*) 6 - 23 (mg/dL)    Creatinine, Ser 8.29  0.50 - 1.35 (mg/dL)    Calcium 9.0  8.4 - 10.5 (mg/dL)    GFR calc non Af Amer >90  >90 (mL/min)    GFR calc Af Amer >90  >90 (mL/min)   CBC     Status: Abnormal   Collection Time   06/03/11 11:00 AM      Component Value Range Comment   WBC 3.9 (*) 4.0 - 10.5 (K/uL)    RBC 4.59  4.22 - 5.81 (MIL/uL)    Hemoglobin 14.3  13.0 - 17.0 (g/dL)    HCT 56.2  13.0 - 86.5 (%)    MCV 88.2  78.0 - 100.0 (fL)    MCH 31.2  26.0 - 34.0 (pg)    MCHC 35.3  30.0 - 36.0 (g/dL)    RDW 78.4  69.6 - 29.5 (%)    Platelets 140 (*) 150 - 400 (K/uL)    No results found.  ROS  Blood pressure 122/84, pulse 57, temperature 97.7 F (36.5 C), temperature source Oral, resp. rate 16, height 6' (1.829 m), weight 215 lb (97.523 kg), SpO2 96.00%. Physical Exam  Constitutional: He appears well-developed and well-nourished.  Cardiovascular: Normal rate, regular rhythm and normal heart sounds.   Respiratory: Effort normal and breath sounds normal. He has no wheezes. He has no rales.  GI: Soft. A hernia (reducible umbilical) is present.  Musculoskeletal:  Back:     Assessment/Plan Umbilical hernia Right shoulder lipoma  Plan repair with mesh and excision of lipoma. Risks and benefits discussed.  Harry Cobb 06/04/2011, 7:34 AM

## 2011-06-04 NOTE — Anesthesia Postprocedure Evaluation (Signed)
  Anesthesia Post-op Note  Patient: Harry Cobb  Procedure(s) Performed:  HERNIA REPAIR UMBILICAL ADULT - umbilical hernia repiar and mesh and removal of lympoma on right shoulder  Patient Location: PACU  Anesthesia Type: General  Level of Consciousness: alert   Airway and Oxygen Therapy: Patient Spontanous Breathing  Post-op Pain: mild  Post-op Assessment: Post-op Vital signs reviewed  Post-op Vital Signs: stable  Complications: No apparent anesthesia complications

## 2011-06-08 ENCOUNTER — Telehealth (INDEPENDENT_AMBULATORY_CARE_PROVIDER_SITE_OTHER): Payer: Self-pay

## 2011-06-08 NOTE — Telephone Encounter (Signed)
Called pt notified him of his path report to be benign per Dr Dwain Sarna. Pt made f/u appt./ [facility]

## 2011-06-11 ENCOUNTER — Encounter (HOSPITAL_BASED_OUTPATIENT_CLINIC_OR_DEPARTMENT_OTHER): Payer: Self-pay | Admitting: General Surgery

## 2011-06-25 ENCOUNTER — Encounter (INDEPENDENT_AMBULATORY_CARE_PROVIDER_SITE_OTHER): Payer: Self-pay | Admitting: General Surgery

## 2011-06-25 ENCOUNTER — Ambulatory Visit (INDEPENDENT_AMBULATORY_CARE_PROVIDER_SITE_OTHER): Payer: PRIVATE HEALTH INSURANCE | Admitting: General Surgery

## 2011-06-25 VITALS — BP 130/84 | HR 51 | Temp 98.5°F | Ht 72.0 in | Wt 224.6 lb

## 2011-06-25 DIAGNOSIS — Z09 Encounter for follow-up examination after completed treatment for conditions other than malignant neoplasm: Secondary | ICD-10-CM

## 2011-06-25 NOTE — Progress Notes (Signed)
Subjective:     Patient ID: Harry Cobb, male   DOB: 1960-09-21, 51 y.o.   MRN: [id number]  HPI This is a 51 year old male who underwent an umbilical hernia repair with mesh as well as an excision of a right shoulder lipoma about 3 weeks ago now. He has done well and he reports no complaints. He does state that his umbilical incision opened superficially about couple weeks ago and that has been healing slowly increased has no problem with that now. He has no fevers.  Review of Systems     Objective:   Physical Exam    right shoulder wound clean without infection or seroma Umbilical wound with small area of superficial separation, no infection Assessment:     S/p UH repair S/p lipoma removal    Plan:     I released him to full activity. He is going to continue applying topical abx to wound and this should heal soon. I asked him to call if gets worse or does not heal in next 3-4 weeks.

## 2011-06-25 NOTE — Patient Instructions (Signed)
If any problems with umbilical wound in 3-4 weeks call me

## 2012-11-03 ENCOUNTER — Encounter (HOSPITAL_COMMUNITY): Payer: Self-pay | Admitting: Pharmacy Technician

## 2012-11-07 NOTE — Patient Instructions (Addendum)
20 Harry Cobb  11/07/2012   Your procedure is scheduled on:  11/20/12  MONDAY  Report to [facility] at  0730     AM.  Call this number if you have problems the morning of surgery: [phone]       Remember:   Do not eat food  Or drink :After Midnight. Sunday NIGHT   Take these medicines the morning of surgery with A SIP OF WATER: NONE   .  Contacts, dentures or partial plates can not be worn to surgery  Leave suitcase in the car. After surgery it may be brought to your room.  For patients admitted to the hospital, checkout time is 11:00 AM day of  discharge.             SPECIAL INSTRUCTIONS- SEE [place] PREPARING FOR SURGERY INSTRUCTION SHEET-     DO NOT WEAR JEWELRY, LOTIONS, POWDERS, OR PERFUMES.  WOMEN-- DO NOT SHAVE LEGS OR UNDERARMS FOR 12 HOURS BEFORE SHOWERS. MEN MAY SHAVE FACE.  Patients discharged the day of surgery will not be allowed to drive home. IF going home the day of surgery, you must have a driver and someone to stay with you for the first 24 hours  Name and phone number of your driver:   admission                                                                     Please read over the following fact sheets that you were given: MRSA Information, Incentive Spirometry Sheet, Blood Transfusion Sheet  Information                                                                                   Lonna Rabold  PST [phone]  [phone]                 FAILURE TO FOLLOW THESE INSTRUCTIONS MAY RESULT IN  CANCELLATION   OF YOUR SURGERY                                                  Patient Signature _____________________________

## 2012-11-08 ENCOUNTER — Encounter (HOSPITAL_COMMUNITY): Payer: Self-pay

## 2012-11-08 ENCOUNTER — Encounter (HOSPITAL_COMMUNITY)
Admission: RE | Admit: 2012-11-08 | Discharge: 2012-11-08 | Disposition: A | Discharge status: Home / Self Care | Payer: BC Managed Care – PPO | Source: Ambulatory Visit | Attending: Orthopedic Surgery | Admitting: Orthopedic Surgery

## 2012-11-08 DIAGNOSIS — Z01812 Encounter for preprocedural laboratory examination: Secondary | ICD-10-CM | POA: Insufficient documentation

## 2012-11-08 DIAGNOSIS — Z96659 Presence of unspecified artificial knee joint: Secondary | ICD-10-CM | POA: Insufficient documentation

## 2012-11-08 DIAGNOSIS — Y835 Amputation of limb(s) as the cause of abnormal reaction of the patient, or of later complication, without mention of misadventure at the time of the procedure: Secondary | ICD-10-CM | POA: Insufficient documentation

## 2012-11-08 DIAGNOSIS — T84099A Other mechanical complication of unspecified internal joint prosthesis, initial encounter: Secondary | ICD-10-CM | POA: Insufficient documentation

## 2012-11-08 LAB — BASIC METABOLIC PANEL
BUN: 16 mg/dL (ref 6–23)
Chloride: 102 mEq/L (ref 96–112)
GFR calc Af Amer: >90 mL/min (ref >90)
Glucose, Bld: 101 mg/dL — ABNORMAL HIGH (ref 70–99)
Potassium: 3.9 mEq/L (ref 3.5–5.1)

## 2012-11-08 LAB — URINALYSIS, ROUTINE W REFLEX MICROSCOPIC
Bilirubin Urine: NEGATIVE
Leukocytes, UA: NEGATIVE
Nitrite: NEGATIVE
Specific Gravity, Urine: 1.022 (ref 1.005–1.030)
pH: 5 (ref 5.0–8.0)

## 2012-11-08 LAB — SURGICAL PCR SCREEN
MRSA, PCR: NEGATIVE
Staphylococcus aureus: POSITIVE — AB

## 2012-11-08 LAB — CBC
HCT: 43.6 % (ref 39.0–52.0)
Hemoglobin: 15.4 g/dL (ref 13.0–17.0)
MCHC: 35.3 g/dL (ref 30.0–36.0)

## 2012-11-08 LAB — PROTIME-INR: INR: 1 (ref 0.00–1.49)

## 2012-11-19 NOTE — H&P (Signed)
TOTAL KNEE REVISION ADMISSION H&P  Patient is being admitted for left revision total knee arthroplasty.  Subjective:  Chief Complaint:    Left knee pain S/P unicompartmental knee arthroplasty.  HPI: Harry Cobb, 52 y.o. male, has a history of pain and functional disability in the left knee(s) due to failed previous arthroplasty and patient has failed non-surgical conservative treatments for greater than 12 weeks to include NSAID's and/or analgesics, corticosteriod injections and activity modification. The indications for the revision of the total knee arthroplasty are pain. Onset of symptoms was gradual starting 5+ years ago with gradually worsening course since that time.  Prior procedures on the left knee(s) include unicompartmental arthroplasty.  Patient currently rates pain in the left knee(s) at 10 out of 10 with activity. There is worsening of pain with activity and weight bearing, pain that interferes with activities of daily living, pain with passive range of motion and joint swelling.  Patient has evidence of previous UKR by imaging studies. This condition presents safety issues increasing the risk of falls. This patient has had failure of unicompartmental arthroplasty.  There is no current active infection.   Risks, benefits and expectations were discussed with the patient. Patient understand the risks, benefits and expectations and wishes to proceed with surgery.   D/C Plans:   Home with HHPT  Post-op Meds:   Rx given for ASA, Zanaflex, Celebrex, Iron, Colace and MiraLax  Tranexamic Acid:   To be given  Decadron:    To be given  FYI:    ASA post-op   Patient Active Problem List   Diagnosis Date Noted  . Umbilical hernia 12/24/2010   Past Medical History  Diagnosis Date  . No pertinent past medical history   . Arthritis     Past Surgical History  Procedure Laterality Date  . Knee surgery  2009/2011    Rt and Lt knee replacment  . Back surgery  2009  . Biceps tendon  repair  2011  . Knee arthroscopy  11/12    lt  . Joint replacement      both partial knee replacements  . Umbilical hernia repair  06/04/2011    Procedure: HERNIA REPAIR UMBILICAL ADULT;  Surgeon: Emelia Loron, MD;  Location: [facility];  Service: General;  Laterality: N/A;  umbilical hernia repiar and mesh and removal of lympoma on right shoulder  . Shoulder cyst  06/04/11    right cyst removed  . Tonsillectomy      No Known Allergies   History  Substance Use Topics  . Smoking status: Never Smoker   . Smokeless tobacco: Never Used  . Alcohol Use: Yes     Comment: occassionally    No family history on file.    Review of Systems  Constitutional: Negative.   HENT: Negative.   Eyes: Negative.   Respiratory: Negative.   Cardiovascular: Negative.   Gastrointestinal: Negative.   Genitourinary: Negative.   Musculoskeletal: Positive for back pain and joint pain.  Skin: Negative.   Neurological: Negative.   Endo/Heme/Allergies: Negative.   Psychiatric/Behavioral: Negative.      Objective:  Physical Exam  Constitutional: He is oriented to person, place, and time. He appears well-developed and well-nourished.  HENT:  Head: Normocephalic and atraumatic.  Mouth/Throat: Oropharynx is clear and moist.  Eyes: Pupils are equal, round, and reactive to light.  Neck: Neck supple. No JVD present. No tracheal deviation present. No thyromegaly present.  Cardiovascular: Normal rate, regular rhythm, normal heart sounds and intact  distal pulses.   Respiratory: Effort normal and breath sounds normal. No respiratory distress. He has no wheezes.  GI: Soft. There is no tenderness. There is no guarding.  Musculoskeletal:       Left knee: He exhibits decreased range of motion, swelling, effusion and laceration (healed from previous surgery). He exhibits no ecchymosis, no deformity and no erythema. Tenderness found.  Lymphadenopathy:    He has no cervical adenopathy.   Neurological: He is alert and oriented to person, place, and time.  Skin: Skin is warm and dry.  Psychiatric: He has a normal mood and affect.    Labs:  Estimated body mass index is 30.45 kg/(m^2) as calculated from the following:   Height as of 06/25/11: 6' (1.829 m).   Weight as of 06/25/11: 101.878 kg (224 lb 9.6 oz).  Imaging Review Plain radiographs demonstrate previous unicompartmental knee arthroplasty of the left knee(s). The overall alignment is neutral. The bone quality appears to be good for age and reported activity level.   Assessment/Plan:  Left knee(s) with failed previous arthroplasty.   The patient history, physical examination, clinical judgment of the provider and imaging studies are consistent with end stage degenerative joint disease of the left knee(s), previous total knee arthroplasty. Revision total knee arthroplasty is deemed medically necessary. The treatment options including medical management, injection therapy, arthroscopy and revision arthroplasty were discussed at length. The risks and benefits of revision total knee arthroplasty were presented and reviewed. The risks due to aseptic loosening, infection, stiffness, patella tracking problems, thromboembolic complications and other imponderables were discussed. The patient acknowledged the explanation, agreed to proceed with the plan and consent was signed. Patient is being admitted for inpatient treatment for surgery, pain control, PT, OT, prophylactic antibiotics, VTE prophylaxis, progressive ambulation and ADL's and discharge planning.The patient is planning to be discharged home with home health services.    Anastasio Auerbach Adley Mazurowski   PAC  11/19/2012, 5:27 PM

## 2012-11-20 ENCOUNTER — Encounter (HOSPITAL_COMMUNITY): Payer: Self-pay | Admitting: *Deleted

## 2012-11-20 ENCOUNTER — Encounter (HOSPITAL_COMMUNITY)
Admission: RE | Disposition: A | Discharge status: Home Health | Payer: Self-pay | Source: Ambulatory Visit | Attending: Orthopedic Surgery

## 2012-11-20 ENCOUNTER — Inpatient Hospital Stay (HOSPITAL_COMMUNITY): Payer: BC Managed Care – PPO | Admitting: Anesthesiology

## 2012-11-20 ENCOUNTER — Encounter (HOSPITAL_COMMUNITY): Payer: Self-pay | Admitting: Anesthesiology

## 2012-11-20 DIAGNOSIS — D62 Acute posthemorrhagic anemia: Secondary | ICD-10-CM | POA: Diagnosis not present

## 2012-11-20 DIAGNOSIS — Z96659 Presence of unspecified artificial knee joint: Secondary | ICD-10-CM

## 2012-11-20 DIAGNOSIS — T84099A Other mechanical complication of unspecified internal joint prosthesis, initial encounter: Principal | ICD-10-CM | POA: Diagnosis present

## 2012-11-20 DIAGNOSIS — E663 Overweight: Secondary | ICD-10-CM

## 2012-11-20 DIAGNOSIS — Y831 Surgical operation with implant of artificial internal device as the cause of abnormal reaction of the patient, or of later complication, without mention of misadventure at the time of the procedure: Secondary | ICD-10-CM | POA: Diagnosis present

## 2012-11-20 DIAGNOSIS — Z6829 Body mass index (BMI) 29.0-29.9, adult: Secondary | ICD-10-CM

## 2012-11-20 DIAGNOSIS — IMO0002 Reserved for concepts with insufficient information to code with codable children: Secondary | ICD-10-CM

## 2012-11-20 DIAGNOSIS — Z96652 Presence of left artificial knee joint: Secondary | ICD-10-CM

## 2012-11-20 HISTORY — PX: TOTAL KNEE ARTHROPLASTY: SHX125

## 2012-11-20 LAB — TYPE AND SCREEN

## 2012-11-20 SURGERY — ARTHROPLASTY, KNEE, TOTAL
Anesthesia: Spinal | Site: Knee | Laterality: Left | Wound class: Clean

## 2012-11-20 MED ORDER — LACTATED RINGERS IV SOLN
INTRAVENOUS | Status: DC
  Administered 2012-11-20: 11:00:00 via INTRAVENOUS
  Administered 2012-11-20: 1000 mL via INTRAVENOUS

## 2012-11-20 MED ORDER — DEXAMETHASONE SODIUM PHOSPHATE 10 MG/ML IJ SOLN
10.0000 mg | Freq: Once | INTRAMUSCULAR | Status: AC
  Administered 2012-11-20: 10 mg via INTRAVENOUS

## 2012-11-20 MED ORDER — ZOLPIDEM TARTRATE 5 MG PO TABS: 5.0000 mg | ORAL_TABLET | Freq: Every evening | ORAL | Status: DC | PRN

## 2012-11-20 MED ORDER — METHOCARBAMOL 100 MG/ML IJ SOLN: 500.0000 mg | Freq: Four times a day (QID) | INTRAVENOUS | Status: DC | PRN

## 2012-11-20 MED ORDER — KETOROLAC TROMETHAMINE 30 MG/ML IJ SOLN
INTRAMUSCULAR | Status: DC | PRN
  Administered 2012-11-20: 30 mg

## 2012-11-20 MED ORDER — ONDANSETRON HCL 4 MG/2ML IJ SOLN
INTRAMUSCULAR | Status: DC | PRN
  Administered 2012-11-20: 4 mg via INTRAVENOUS

## 2012-11-20 MED ORDER — DOCUSATE SODIUM 100 MG PO CAPS
100.0000 mg | ORAL_CAPSULE | Freq: Two times a day (BID) | ORAL | Status: DC
  Administered 2012-11-20 – 2012-11-21 (×2): 100 mg via ORAL

## 2012-11-20 MED ORDER — ONDANSETRON HCL 4 MG/2ML IJ SOLN: 4.0000 mg | Freq: Four times a day (QID) | INTRAMUSCULAR | Status: DC | PRN

## 2012-11-20 MED ORDER — DEXAMETHASONE SODIUM PHOSPHATE 10 MG/ML IJ SOLN
10.0000 mg | Freq: Once | INTRAMUSCULAR | Status: AC
  Administered 2012-11-21: 10 mg via INTRAVENOUS
  Filled 2012-11-20: qty 1

## 2012-11-20 MED ORDER — TRANEXAMIC ACID 100 MG/ML IV SOLN
1000.0000 mg | Freq: Once | INTRAVENOUS | Status: AC
  Administered 2012-11-20: 1000 mg via INTRAVENOUS
  Filled 2012-11-20: qty 10

## 2012-11-20 MED ORDER — FERROUS SULFATE 325 (65 FE) MG PO TABS
325.0000 mg | ORAL_TABLET | Freq: Three times a day (TID) | ORAL | Status: DC
  Administered 2012-11-20 – 2012-11-21 (×2): 325 mg via ORAL
  Filled 2012-11-20 (×5): qty 1

## 2012-11-20 MED ORDER — PROPOFOL INFUSION 10 MG/ML OPTIME
INTRAVENOUS | Status: DC | PRN
  Administered 2012-11-20: 140 ug/kg/min via INTRAVENOUS

## 2012-11-20 MED ORDER — MENTHOL 3 MG MT LOZG: 1.0000 | LOZENGE | OROMUCOSAL | Status: DC | PRN

## 2012-11-20 MED ORDER — METHOCARBAMOL 500 MG PO TABS
500.0000 mg | ORAL_TABLET | Freq: Four times a day (QID) | ORAL | Status: DC | PRN
  Administered 2012-11-20 (×2): 500 mg via ORAL
  Filled 2012-11-20 (×2): qty 1

## 2012-11-20 MED ORDER — BUPIVACAINE HCL (PF) 0.75 % IJ SOLN
INTRAMUSCULAR | Status: DC | PRN
  Administered 2012-11-20: 15 mg via INTRATHECAL

## 2012-11-20 MED ORDER — PROPOFOL 10 MG/ML IV BOLUS
INTRAVENOUS | Status: DC | PRN
  Administered 2012-11-20: 10 mg via INTRAVENOUS
  Administered 2012-11-20: 30 mg via INTRAVENOUS
  Administered 2012-11-20: 20 mg via INTRAVENOUS

## 2012-11-20 MED ORDER — MIDAZOLAM HCL 5 MG/5ML IJ SOLN
INTRAMUSCULAR | Status: DC | PRN
  Administered 2012-11-20: 2 mg via INTRAVENOUS

## 2012-11-20 MED ORDER — SODIUM CHLORIDE 0.9 % IV SOLN
INTRAVENOUS | Status: DC
  Administered 2012-11-20: 14:00:00 via INTRAVENOUS
  Filled 2012-11-20 (×4): qty 1000

## 2012-11-20 MED ORDER — DIPHENHYDRAMINE HCL 25 MG PO CAPS: 25.0000 mg | ORAL_CAPSULE | Freq: Four times a day (QID) | ORAL | Status: DC | PRN

## 2012-11-20 MED ORDER — BISACODYL 10 MG RE SUPP: 10.0000 mg | Freq: Every day | RECTAL | Status: DC | PRN

## 2012-11-20 MED ORDER — FENTANYL CITRATE 0.05 MG/ML IJ SOLN
INTRAMUSCULAR | Status: DC | PRN
  Administered 2012-11-20 (×2): 50 ug via INTRAVENOUS

## 2012-11-20 MED ORDER — LACTATED RINGERS IV SOLN: INTRAVENOUS | Status: DC

## 2012-11-20 MED ORDER — ONDANSETRON HCL 4 MG PO TABS: 4.0000 mg | ORAL_TABLET | Freq: Four times a day (QID) | ORAL | Status: DC | PRN

## 2012-11-20 MED ORDER — SODIUM CHLORIDE 0.9 % IJ SOLN
INTRAMUSCULAR | Status: DC | PRN
  Administered 2012-11-20: 11:00:00

## 2012-11-20 MED ORDER — METOCLOPRAMIDE HCL 5 MG/ML IJ SOLN: 5.0000 mg | Freq: Three times a day (TID) | INTRAMUSCULAR | Status: DC | PRN

## 2012-11-20 MED ORDER — METOCLOPRAMIDE HCL 10 MG PO TABS: 5.0000 mg | ORAL_TABLET | Freq: Three times a day (TID) | ORAL | Status: DC | PRN

## 2012-11-20 MED ORDER — CEFAZOLIN SODIUM-DEXTROSE 2-3 GM-% IV SOLR
2.0000 g | INTRAVENOUS | Status: AC
  Administered 2012-11-20: 2 g via INTRAVENOUS

## 2012-11-20 MED ORDER — CEFAZOLIN SODIUM-DEXTROSE 2-3 GM-% IV SOLR
2.0000 g | Freq: Four times a day (QID) | INTRAVENOUS | Status: AC
  Administered 2012-11-20 (×2): 2 g via INTRAVENOUS
  Filled 2012-11-20 (×2): qty 50

## 2012-11-20 MED ORDER — ALUM & MAG HYDROXIDE-SIMETH 200-200-20 MG/5ML PO SUSP: 30.0000 mL | ORAL | Status: DC | PRN

## 2012-11-20 MED ORDER — HYDROMORPHONE HCL PF 1 MG/ML IJ SOLN: 0.2500 mg | INTRAMUSCULAR | Status: DC | PRN

## 2012-11-20 MED ORDER — HYDROCODONE-ACETAMINOPHEN 7.5-325 MG PO TABS
1.0000 | ORAL_TABLET | ORAL | Status: DC
  Administered 2012-11-20 (×2): 1 via ORAL
  Administered 2012-11-20 (×2): 2 via ORAL
  Administered 2012-11-21: 1 via ORAL
  Administered 2012-11-21: 2 via ORAL
  Filled 2012-11-20: qty 1
  Filled 2012-11-20: qty 2
  Filled 2012-11-20 (×3): qty 1
  Filled 2012-11-20: qty 2
  Filled 2012-11-20: qty 1

## 2012-11-20 MED ORDER — CELECOXIB 200 MG PO CAPS
200.0000 mg | ORAL_CAPSULE | Freq: Two times a day (BID) | ORAL | Status: DC
  Administered 2012-11-20 – 2012-11-21 (×3): 200 mg via ORAL
  Filled 2012-11-20 (×4): qty 1

## 2012-11-20 MED ORDER — BUPIVACAINE LIPOSOME 1.3 % IJ SUSP
20.0000 mL | Freq: Once | INTRAMUSCULAR | Status: DC
  Filled 2012-11-20: qty 20

## 2012-11-20 MED ORDER — BUPIVACAINE-EPINEPHRINE PF 0.25-1:200000 % IJ SOLN
INTRAMUSCULAR | Status: DC | PRN
  Administered 2012-11-20: 25 mL

## 2012-11-20 MED ORDER — ASPIRIN EC 325 MG PO TBEC
325.0000 mg | DELAYED_RELEASE_TABLET | Freq: Two times a day (BID) | ORAL | Status: DC
  Administered 2012-11-21: 325 mg via ORAL
  Filled 2012-11-20 (×3): qty 1

## 2012-11-20 MED ORDER — FLEET ENEMA 7-19 GM/118ML RE ENEM: 1.0000 | ENEMA | Freq: Once | RECTAL | Status: AC | PRN

## 2012-11-20 MED ORDER — POLYETHYLENE GLYCOL 3350 17 G PO PACK
17.0000 g | PACK | Freq: Two times a day (BID) | ORAL | Status: DC
  Administered 2012-11-20 – 2012-11-21 (×2): 17 g via ORAL

## 2012-11-20 MED ORDER — HYDROMORPHONE HCL PF 1 MG/ML IJ SOLN
0.5000 mg | INTRAMUSCULAR | Status: DC | PRN
  Administered 2012-11-20 (×2): 1 mg via INTRAVENOUS
  Administered 2012-11-20 (×2): 0.5 mg via INTRAVENOUS
  Administered 2012-11-21: 1 mg via INTRAVENOUS
  Filled 2012-11-20 (×4): qty 1

## 2012-11-20 MED ORDER — PHENOL 1.4 % MT LIQD: 1.0000 | OROMUCOSAL | Status: DC | PRN

## 2012-11-20 SURGICAL SUPPLY — 54 items
BAG ZIPLOCK 12X15 (MISCELLANEOUS) ×2 IMPLANT
BANDAGE ELASTIC 6 VELCRO ST LF (GAUZE/BANDAGES/DRESSINGS) ×2 IMPLANT
BANDAGE ESMARK 6X9 LF (GAUZE/BANDAGES/DRESSINGS) ×1 IMPLANT
BLADE SAW SGTL 13.0X1.19X90.0M (BLADE) ×2 IMPLANT
BNDG ESMARK 6X9 LF (GAUZE/BANDAGES/DRESSINGS) ×2
BOWL SMART MIX CTS (DISPOSABLE) ×2 IMPLANT
CEMENT HV SMART SET (Cement) ×2 IMPLANT
CLOTH BEACON ORANGE TIMEOUT ST (SAFETY) ×2 IMPLANT
CUFF TOURN SGL QUICK 34 (TOURNIQUET CUFF) ×1
CUFF TRNQT CYL 34X4X40X1 (TOURNIQUET CUFF) ×1 IMPLANT
DECANTER SPIKE VIAL GLASS SM (MISCELLANEOUS) ×2 IMPLANT
DERMABOND ADVANCED (GAUZE/BANDAGES/DRESSINGS) ×1
DERMABOND ADVANCED .7 DNX12 (GAUZE/BANDAGES/DRESSINGS) ×1 IMPLANT
DRAPE EXTREMITY T 121X128X90 (DRAPE) ×2 IMPLANT
DRAPE POUCH INSTRU U-SHP 10X18 (DRAPES) ×2 IMPLANT
DRAPE U-SHAPE 47X51 STRL (DRAPES) ×2 IMPLANT
DRSG AQUACEL AG ADV 3.5X10 (GAUZE/BANDAGES/DRESSINGS) ×2 IMPLANT
DRSG TEGADERM 4X4.75 (GAUZE/BANDAGES/DRESSINGS) ×2 IMPLANT
DURAPREP 26ML APPLICATOR (WOUND CARE) ×2 IMPLANT
ELECT REM PT RETURN 9FT ADLT (ELECTROSURGICAL) ×2
ELECTRODE REM PT RTRN 9FT ADLT (ELECTROSURGICAL) ×1 IMPLANT
EVACUATOR 1/8 PVC DRAIN (DRAIN) ×2 IMPLANT
FACESHIELD LNG OPTICON STERILE (SAFETY) ×10 IMPLANT
GAUZE SPONGE 2X2 8PLY STRL LF (GAUZE/BANDAGES/DRESSINGS) ×1 IMPLANT
GLOVE BIOGEL PI IND STRL 7.5 (GLOVE) ×1 IMPLANT
GLOVE BIOGEL PI IND STRL 8 (GLOVE) ×1 IMPLANT
GLOVE BIOGEL PI INDICATOR 7.5 (GLOVE) ×1
GLOVE BIOGEL PI INDICATOR 8 (GLOVE) ×1
GLOVE ECLIPSE 8.0 STRL XLNG CF (GLOVE) ×2 IMPLANT
GLOVE ORTHO TXT STRL SZ7.5 (GLOVE) ×4 IMPLANT
GOWN BRE IMP PREV XXLGXLNG (GOWN DISPOSABLE) ×2 IMPLANT
GOWN STRL NON-REIN LRG LVL3 (GOWN DISPOSABLE) ×2 IMPLANT
HANDPIECE INTERPULSE COAX TIP (DISPOSABLE) ×1
KIT BASIN OR (CUSTOM PROCEDURE TRAY) ×2 IMPLANT
MANIFOLD NEPTUNE II (INSTRUMENTS) ×2 IMPLANT
NDL SAFETY ECLIPSE 18X1.5 (NEEDLE) ×1 IMPLANT
NEEDLE HYPO 18GX1.5 SHARP (NEEDLE) ×1
NS IRRIG 1000ML POUR BTL (IV SOLUTION) ×4 IMPLANT
PACK TOTAL JOINT (CUSTOM PROCEDURE TRAY) ×2 IMPLANT
POSITIONER SURGICAL ARM (MISCELLANEOUS) ×2 IMPLANT
SET HNDPC FAN SPRY TIP SCT (DISPOSABLE) ×1 IMPLANT
SET PAD KNEE POSITIONER (MISCELLANEOUS) ×2 IMPLANT
SPONGE GAUZE 2X2 STER 10/PKG (GAUZE/BANDAGES/DRESSINGS) ×1
SUCTION FRAZIER 12FR DISP (SUCTIONS) ×2 IMPLANT
SUT MNCRL AB 4-0 PS2 18 (SUTURE) ×2 IMPLANT
SUT VIC AB 1 CT1 36 (SUTURE) ×2 IMPLANT
SUT VIC AB 2-0 CT1 27 (SUTURE) ×3
SUT VIC AB 2-0 CT1 TAPERPNT 27 (SUTURE) ×3 IMPLANT
SUT VLOC 180 0 24IN GS25 (SUTURE) ×2 IMPLANT
SYR 50ML LL SCALE MARK (SYRINGE) ×2 IMPLANT
TOWEL OR 17X26 10 PK STRL BLUE (TOWEL DISPOSABLE) ×4 IMPLANT
TRAY FOLEY CATH 14FRSI W/METER (CATHETERS) ×2 IMPLANT
WATER STERILE IRR 1500ML POUR (IV SOLUTION) ×2 IMPLANT
WRAP KNEE MAXI GEL POST OP (GAUZE/BANDAGES/DRESSINGS) ×2 IMPLANT

## 2012-11-20 NOTE — Anesthesia Preprocedure Evaluation (Signed)
Anesthesia Evaluation  Patient identified by MRN, date of birth, ID band Patient awake    Reviewed: Allergy & Precautions, H&P , NPO status , Patient's Chart, lab work & pertinent test results  Airway Mallampati: II TM Distance: >3 FB Neck ROM: full    Dental no notable dental hx. (+) Teeth Intact and Dental Advisory Given   Pulmonary neg pulmonary ROS,  breath sounds clear to auscultation  Pulmonary exam normal       Cardiovascular Exercise Tolerance: Good negative cardio ROS  Rhythm:regular Rate:Normal     Neuro/Psych Back surgery negative neurological ROS  negative psych ROS   GI/Hepatic negative GI ROS, Neg liver ROS,   Endo/Other  negative endocrine ROS  Renal/GU negative Renal ROS  negative genitourinary   Musculoskeletal   Abdominal   Peds  Hematology negative hematology ROS (+)   Anesthesia Other Findings   Reproductive/Obstetrics negative OB ROS                           Anesthesia Physical Anesthesia Plan  ASA: I  Anesthesia Plan: Spinal   Post-op Pain Management:    Induction:   Airway Management Planned: Simple Face Mask  Additional Equipment:   Intra-op Plan:   Post-operative Plan:   Informed Consent: I have reviewed the patients History and Physical, chart, labs and discussed the procedure including the risks, benefits and alternatives for the proposed anesthesia with the patient or authorized representative who has indicated his/her understanding and acceptance.   Dental Advisory Given  Plan Discussed with: CRNA and Surgeon  Anesthesia Plan Comments:         Anesthesia Quick Evaluation

## 2012-11-20 NOTE — Plan of Care (Signed)
Problem: Consults Goal: Diagnosis- Total Joint Replacement Primary Total Knee     

## 2012-11-20 NOTE — Interval H&P Note (Signed)
History and Physical Interval Note:  11/20/2012 9:00 AM  Harry Cobb  has presented today for surgery, with the diagnosis of LEFT UNI KNEE ARTHROPLASTY FAILED   The various methods of treatment have been discussed with the patient and family. After consideration of risks, benefits and other options for treatment, the patient has consented to  Procedure(s): CONVERT LEFT UNI KNEE ARTHROPLASTY TO LEFT TOTAL KNEE ARTHROPLASTY (Left) as a surgical intervention .  The patient's history has been reviewed, patient examined, no change in status, stable for surgery.  I have reviewed the patient's chart and labs.  Questions were answered to the patient's satisfaction.     Shelda Pal

## 2012-11-20 NOTE — Anesthesia Postprocedure Evaluation (Signed)
  Anesthesia Post-op Note  Patient: Harry Cobb  Procedure(s) Performed: Procedure(s) (LRB): CONVERT LEFT UNI KNEE ARTHROPLASTY TO LEFT TOTAL KNEE ARTHROPLASTY (Left)  Patient Location: PACU  Anesthesia Type: Spinal  Level of Consciousness: awake and alert   Airway and Oxygen Therapy: Patient Spontanous Breathing  Post-op Pain: mild  Post-op Assessment: Post-op Vital signs reviewed, Patient's Cardiovascular Status Stable, Respiratory Function Stable, Patent Airway and No signs of Nausea or vomiting  Last Vitals:  Filed Vitals:   11/20/12 1615  BP: 99/69  Pulse: 53  Temp: 36.4 C  Resp: 14    Post-op Vital Signs: stable   Complications: No apparent anesthesia complications

## 2012-11-20 NOTE — Transfer of Care (Signed)
Immediate Anesthesia Transfer of Care Note  Patient: Harry Cobb  Procedure(s) Performed: Procedure(s): CONVERT LEFT UNI KNEE ARTHROPLASTY TO LEFT TOTAL KNEE ARTHROPLASTY (Left)  Patient Location: PACU  Anesthesia Type:Spinal  Level of Consciousness: awake, alert  and oriented  Airway & Oxygen Therapy: Patient Spontanous Breathing  Post-op Assessment: Report given to PACU RN and Post -op Vital signs reviewed and stable  Post vital signs: Reviewed and stable  Complications: No apparent anesthesia complications

## 2012-11-20 NOTE — Brief Op Note (Signed)
11/20/2012  12:00 PM  PATIENT:  Harry Cobb  52 y.o. male  PRE-OPERATIVE DIAGNOSIS:  Failed LEFT UNIcompartmental KNEE ARTHROPLASTY  POST-OPERATIVE DIAGNOSIS: Failed LEFT UNIcompartmental KNEE ARTHROPLASTY   PROCEDURE:  Procedure(s): Revision/CONVERTion of  LEFT UNI KNEE ARTHROPLASTY TO LEFT TOTAL KNEE ARTHROPLASTY (Left)  SURGEON:  Surgeon(s) and Role:    * Shelda Pal, MD - Primary  PHYSICIAN ASSISTANT: Lanney Gins, PA-C  ANESTHESIA:   spinal  EBL:  Total I/O In: 1000 [I.V.:1000] Out: 475 [Urine:425; Blood:50]  BLOOD ADMINISTERED:none  DRAINS: (1 medium) Hemovact drain(s) in the left knee with  Suction Open   LOCAL MEDICATIONS USED:  OTHER Exparel  SPECIMEN:  No Specimen  DISPOSITION OF SPECIMEN:  N/A  COUNTS:  YES  TOURNIQUET:   Total Tourniquet Time Documented: Thigh (Left) - 64 minutes Total: Thigh (Left) - 64 minutes   DICTATION: .Other Dictation: Dictation Number [id number]  PLAN OF CARE: Admit to inpatient   PATIENT DISPOSITION:  PACU - hemodynamically stable.   Delay start of Pharmacological VTE agent (>24hrs) due to surgical blood loss or risk of bleeding: no

## 2012-11-20 NOTE — Anesthesia Procedure Notes (Signed)
Spinal  Patient location during procedure: OR Start time: 11/20/2012 9:50 AM End time: 11/20/2012 10:00 AM Staffing Anesthesiologist: Ronelle Nigh L Performed by: anesthesiologist  Preanesthetic Checklist Completed: patient identified, site marked, surgical consent, pre-op evaluation, timeout performed, IV checked, risks and benefits discussed and monitors and equipment checked Spinal Block Patient position: sitting Prep: Betadine Patient monitoring: heart rate, continuous pulse ox and blood pressure Approach: midline Location: L2-3 Injection technique: single-shot Needle Needle type: Whitacre  Needle gauge: 25 G Needle length: 9 cm Assessment Sensory level: T6 Additional Notes Expiration date of kit checked and confirmed. Patient tolerated procedure well, without complications.

## 2012-11-20 NOTE — Preoperative (Signed)
Beta Blockers   Reason not to administer Beta Blockers:Not Applicable 

## 2012-11-21 ENCOUNTER — Encounter (HOSPITAL_COMMUNITY): Payer: Self-pay | Admitting: Orthopedic Surgery

## 2012-11-21 DIAGNOSIS — E663 Overweight: Secondary | ICD-10-CM

## 2012-11-21 DIAGNOSIS — IMO0002 Reserved for concepts with insufficient information to code with codable children: Secondary | ICD-10-CM

## 2012-11-21 LAB — BASIC METABOLIC PANEL
BUN: 16 mg/dL (ref 6–23)
Chloride: 101 mEq/L (ref 96–112)
Creatinine, Ser: 0.86 mg/dL (ref 0.50–1.35)
GFR calc Af Amer: >90 mL/min (ref >90)
Glucose, Bld: 128 mg/dL — ABNORMAL HIGH (ref 70–99)

## 2012-11-21 LAB — CBC
HCT: 36.2 % — ABNORMAL LOW (ref 39.0–52.0)
Hemoglobin: 12.8 g/dL — ABNORMAL LOW (ref 13.0–17.0)
MCV: 87.4 fL (ref 78.0–100.0)
RDW: 12.5 % (ref 11.5–15.5)
WBC: 10 10*3/uL (ref 4.0–10.5)

## 2012-11-21 MED ORDER — FERROUS SULFATE 325 (65 FE) MG PO TABS: 325.0000 mg | ORAL_TABLET | Freq: Three times a day (TID) | ORAL | Status: DC

## 2012-11-21 MED ORDER — HYDROCODONE-ACETAMINOPHEN 7.5-325 MG PO TABS: 1.0000 | ORAL_TABLET | ORAL | Status: DC | PRN

## 2012-11-21 MED ORDER — ASPIRIN 325 MG PO TBEC: 325.0000 mg | DELAYED_RELEASE_TABLET | Freq: Two times a day (BID) | ORAL | Status: DC

## 2012-11-21 MED ORDER — POLYETHYLENE GLYCOL 3350 17 G PO PACK: 17.0000 g | PACK | Freq: Two times a day (BID) | ORAL | Status: DC

## 2012-11-21 MED ORDER — TIZANIDINE HCL 4 MG PO CAPS: 4.0000 mg | ORAL_CAPSULE | Freq: Three times a day (TID) | ORAL | Status: DC

## 2012-11-21 MED ORDER — DSS 100 MG PO CAPS: 100.0000 mg | ORAL_CAPSULE | Freq: Two times a day (BID) | ORAL | Status: DC

## 2012-11-21 MED ORDER — CELECOXIB 200 MG PO CAPS: 200.0000 mg | ORAL_CAPSULE | Freq: Two times a day (BID) | ORAL | Status: DC

## 2012-11-21 NOTE — Plan of Care (Signed)
Problem: Consults Goal: Diagnosis- Total Joint Replacement Outcome: Completed/Met Date Met:  11/21/12 Primary Total Knee LEFT

## 2012-11-21 NOTE — Progress Notes (Signed)
OT Cancellation Note  Patient Details Name: Harry Cobb MRN: [id number] DOB: 1960-08-22   Cancelled Treatment:     Pt screened for OT.  He has had previous knee surgeries and doesn't feel he will need DME.  Plans OP PT.  Twilla Khouri 11/21/2012, 10:03 AM Marica Otter, OTR/L [phone] 11/21/2012

## 2012-11-21 NOTE — Progress Notes (Signed)
   Subjective: 1 Day Post-Op Procedure(s) (LRB): CONVERT LEFT UNI KNEE ARTHROPLASTY TO LEFT TOTAL KNEE ARTHROPLASTY (Left)   Patient reports pain as mild, pain well controlled. No events throughout the night. Ready to be discharged home if he does well with PT and pain stays well controlled.   Objective:   VITALS:   Filed Vitals:   11/21/12 1005  BP: 111/73  Pulse: 69  Temp: 97.7 F (36.5 C)  Resp: 16    Neurovascular intact Dorsiflexion/Plantar flexion intact Incision: dressing C/D/I No cellulitis present Compartment soft  LABS  Recent Labs  11/21/12 0415  HGB 12.8*  HCT 36.2*  WBC 10.0  PLT 174     Recent Labs  11/21/12 0415  NA 135  K 4.2  BUN 16  CREATININE 0.86  GLUCOSE 128*     Assessment/Plan: 1 Day Post-Op Procedure(s) (LRB): CONVERT LEFT UNI KNEE ARTHROPLASTY TO LEFT TOTAL KNEE ARTHROPLASTY (Left) HV drain d/c'ed Foley cath d/c'ed Advance diet Up with therapy D/C IV fluids Discharge home with home health Follow up in 2 weeks at [facility]. Follow up with OLIN,Dorance Spink D in 2 weeks.  Contact information:  [facility] [address] [place] [address] [phone]    Expected ABLA  Treated with iron and will observe  Overweight (BMI 25-29.9) Estimated body mass index is 29.3 kg/(m^2) as calculated from the following:   Height as of this encounter: 5\' 11"  (1.803 m).   Weight as of this encounter: 95.255 kg (210 lb). Patient also counseled that weight may inhibit the healing process Patient counseled that losing weight will help with future health issues        Anastasio Auerbach. Chloeanne Poteet   PAC  11/21/2012, 11:10 AM

## 2012-11-21 NOTE — Op Note (Signed)
NAMEANTOINNE, SPADACCINI NO.:  [id number]  MEDICAL RECORD NO.:  [id number]  LOCATION:  1613                         FACILITY:  [facility]  PHYSICIAN:  Madlyn Frankel. Charlann Boxer, M.D.  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  PROCEDURE:  Conversion of left partial medial compartment arthroplasty to the left total knee replacement.  COMPONENTS USED:  DePuy rotating platform, posterior stabilized knee system, size 4 narrow femur, 3 tibia, 12.5 posterior stabilized insert, 41 patellar button.  SURGEON:  Madlyn Frankel. Charlann Boxer, M.D.  ASSISTANT:  Lanney Gins, PA-C.  Note that Mr. Carmon Sails was about was present for the entire case.  Present from preoperative position, perioperative management, operative extremity, general facilitation of the case, primary wound closure.  ANESTHESIA:  Spinal.  SPECIMENS:  None.  DRAINS:  One medium Hemovac.  ESTIMATED BLOOD LOSS:  Less than 100 mL.  TOURNIQUET TIME:  63 minutes at 250 mmHg.  INDICATION FOR PROCEDURE:  Mr. Dolata is a 52 year old gentleman who had a partial knee replacement about 4 years ago.  He had done very well with this, not returning to his normal activity including competitive golf.  He has recently had some progressive degenerative changes increasing discomfort.  We have tried to salvage the joint with arthroscopic surgery managing lateral meniscal pathology, but he has had persistent recurring problems loading this knee as aggressively as he would like.  Given his reduction in quality of life and his response to a right total knee replacement which was done  in the interim.  He, at this point, is ready to proceed with more definitive measures of conversion to total knee replacement.  Reviewed the risks of infection, DVT, component failure, need for future revision, as well as persistent discomfort with certain activities.  Despite this, and then again based on the development of his right total knee replacement, he is ready to proceed with the surgery.  Consent was obtained for benefit of pain relief.  DESCRIPTION OF PROCEDURE:  The patient was brought to operative theater. Once adequate anesthesia, preoperative antibiotics, Ancef administered. He was positioned supine with left thigh tourniquet placed.  The left lower extremity was then prepped and draped in sterile fashion.  Time- out was performed identifying the patient, planned procedure, and extremity.  Leg was exsanguinated, tourniquet elevated to 250 mmHg.  The patient's old incision was utilized and extended proximal distal for exposure for total knee arthroplasty.  Soft tissue planes created. Median arthrotomy was made encountering clear synovial fluid.  Following initial exposure and debridement scar, recreating medial and lateral gutters including lateral subluxation patella, attention was first directed to the patella precut measurement was noted to be 26 mm.  I have resected down to about 14-15 mm and used a 41 patellar button to restore height.  Lug holes were drilled in and metal Shim was placed on the patella to protect it from  retractors and saw blades.  At this point, the knee was flexed.  Following removal of the cruciate ligaments, remaining meniscus, the starting drill for the femoral canal was used to open up the femoral canal.  I irrigated this to try to prevent fat emboli.  The intramedullary rod was then passed in 5 degrees of valgus.  I resected 10 mm of bone off the distal femur.  At this point, just proximal to the previously placed femoral component of  the partial knee replacement.  The partial knee replacement component was then removed without difficulty with minimal to no bone loss other than that from the cut.  At this point, the tibia was subluxated anteriorly and using the extramedullary guide, I initially resected what was felt to be a 10-mm off proximal tibia.  With this, I was just at the level of the previous medial component which was noted to be a little bit of varus radiographically as well as intraoperatively.  I removed the tibial component only loosing bone in the areas where the cemented component keel had been.  At this point, when I tried to place the extension block, I recognized that the gap in extension was going to be tight.  I thus removed approximately 3-4 mm more of bone off the proximal tibia.  At this point, this was pretty much at the level of the previously placed tibial component.  No need for any augments, wedges, or screw placement.  At this point, I sized the femur to be a size 4 and most likely using narrow component based on the medial and lateral dimensions.  He had a size 4 component on the other knee.  A size 4 block was then pinned in position.  The rotation was set using the C-clamp and anterior-posterior and chamfer cuts were then made.  Final box cut was made off the lateral aspect of distal femur.  Given all these findings, and then the tibia was subluxated again and based on the tibial cut removing medial osteophytes, a size 3 tibial tray was pinned in position, drilled, and keel punched.  This matched the contralateral knee.  At this point, we did a trial reduction with a 4 narrow femur, 3 tibia, and a 12.5 insert which provided the best stability from extension to flexion with the patella tracking through the trochlea without application of pressure. At this point, all trial components were removed.  We drilled holes into the sclerotic bone, and then irrigated the knee with normal  saline solution and pulse lavaged.  There is a bit of anterior notching which I feathered using the saw blade. Final components were opened.  The synovial capsule junction in the knee was injected with 60 mL of a combination of Exparel, 25 mL of Marcaine, 1 mL of Toradol, and saline.  Once the cement was prepared, the final components were cemented into position.  A trial 12.5 insert was placed and the knee was brought into extension until the cement fully cured. Once the cement had cured, excessive cement was removed throughout the knee.  The final 12.5 insert to match the 4 femur was placed.  At this point, the tourniquet had been let down after 63 minutes.  We irrigated the knee again and reapproximated extensor mechanism with #1 Vicryl and 0 V-Loc over a Hemovac drain.  The remaining wound was closed with 2-0 Vicryl and running 4-0 Monocryl.  The knee was cleaned, dried, dressed sterilely with Dermabond and Aquacel dressing.  Drain site  dressed separately.  He was then brought to recovery room in stable condition, tolerating the procedure well.  He will be weightbearing as tolerated, work with Physical therapy to maximize interval strength, to try to return to functional activities as soon as possible.     Madlyn Frankel Charlann Boxer, M.D.     MDO/MEDQ  D:  11/20/2012  T:  11/21/2012  Job:  [id number]

## 2012-11-21 NOTE — Progress Notes (Signed)
Pt to d/c home. Pain and outpatient PT script given to pt. No DME needs. AVS reviewed and "My Chart" discussed with pt. Pt capable of verbalizing medications and follow-up appointments. Remains hemodynamically stable. No signs and symptoms of distress. Educated pt to return to ER in the case of SOB, dizziness, or chest pain.

## 2012-11-21 NOTE — Care Management Note (Signed)
    Page 1 of 1   11/21/2012     1:57:49 PM   CARE MANAGEMENT NOTE 11/21/2012  Patient:  Harry Cobb, Harry Cobb   Account Number:  [id number]  Date Initiated:  11/21/2012  Documentation initiated by:  Colleen Can  Subjective/Objective Assessment:   DX LEFT PARTIAL KNEE REPLACEMNT     Action/Plan:   CM spoke with patient. Plans are for patient to return to his home where spouse will be caregiver. He already has crutches. No recommendations fro [facility] services.   Anticipated DC Date:  11/21/2012   Anticipated DC Plan:  HOME/SELF CARE      DC Planning Services  CM consult      Choice offered to / List presented to:             Status of service:  Completed, signed off Medicare Important Message given?   (If response is "NO", the following Medicare IM given date fields will be blank) Date Medicare IM given:   Date Additional Medicare IM given:    Discharge Disposition:  HOME/SELF CARE  Per UR Regulation:  Reviewed for med. necessity/level of care/duration of stay  If discussed at Long Length of Stay Meetings, dates discussed:    Comments:

## 2012-11-21 NOTE — Evaluation (Addendum)
Physical Therapy Evaluation Patient Details Name: Harry Cobb MRN: [id number] DOB: Nov 12, 1960 Today's Date: 11/21/2012 Time: 0921-0951 PT Time Calculation (min): 30 min  PT Assessment / Plan / Recommendation Clinical Impression  s/p UKA conversion to L TKA;     PT Assessment  All further PT needs can be met in the next venue of care    Follow Up Recommendations  Outpatient PT    Does the patient have the potential to tolerate intense rehabilitation      Barriers to Discharge        Equipment Recommendations  None recommended by PT    Recommendations for Other Services     Frequency      Precautions / Restrictions Precautions Precautions: Knee Restrictions Weight Bearing Restrictions: No LLE Weight Bearing: Weight bearing as tolerated   Pertinent Vitals/Pain Pain 4/10 left knee; "very tolerable"      Mobility  Bed Mobility Bed Mobility: Supine to Sit Supine to Sit: 7: Independent Transfers Transfers: Sit to Stand;Stand to Sit Sit to Stand: 7: Independent Stand to Sit: 7: Independent Ambulation/Gait Ambulation/Gait Assistance: 6: Modified independent (Device/Increase time) Ambulation Distance (Feet): 220 Feet Assistive device: Crutches Gait Pattern: Step-to pattern;Step-through pattern    Exercises Total Joint Exercises Ankle Circles/Pumps: AROM;Both;10 reps Quad Sets: Strengthening;Left;20 reps Heel Slides: AROM;Left;10 reps Straight Leg Raises: AROM;Left;20 reps Knee Flexion: AROM;AAROM;Left;10 reps Goniometric ROM: 5-86* flexion Up/ down 6 steps with crutches and supervision    PT Diagnosis:    PT Problem List:   PT Treatment Interventions:     PT Goals    Visit Information  Last PT Received On: 11/21/12 Assistance Needed: +1    Subjective Data  Subjective: last night wasn't bad Patient Stated Goal: home   Prior Functioning  Home Living Lives With: Spouse Type of Home: House Home Access: Stairs to enter Secretary/administrator of  Steps: 2 small Home Layout: Two level;Bed/bath upstairs Home Adaptive Equipment: Crutches Prior Function Level of Independence: Independent Able to Take Stairs?: Yes Driving: Yes Communication Communication: No difficulties    Cognition  Cognition Arousal/Alertness: Awake/alert Behavior During Therapy: WFL for tasks assessed/performed Overall Cognitive Status: Within Functional Limits for tasks assessed    Extremity/Trunk Assessment Right Upper Extremity Assessment RUE ROM/Strength/Tone: [facility] for tasks assessed Left Upper Extremity Assessment LUE ROM/Strength/Tone: WFL for tasks assessed Right Lower Extremity Assessment RLE ROM/Strength/Tone: WFL for tasks assessed Left Lower Extremity Assessment LLE ROM/Strength/Tone: Deficits LLE ROM/Strength/Tone Deficits: at least 3/5   Balance Static Standing Balance Static Standing - Balance Support: No upper extremity supported Static Standing - Level of Assistance: 6: Modified independent (Device/Increase time);5: Stand by assistance  End of Session PT - End of Session Activity Tolerance: Patient tolerated treatment well Patient left: in chair;with call bell/phone within reach  GP     [facility] 11/21/2012, 9:56 AM

## 2012-11-24 NOTE — Discharge Summary (Signed)
Physician Discharge Summary  Patient ID: Harry Cobb MRN: [id number] DOB/AGE: 1961/06/10 52 y.o.  Admit date: 11/20/2012 Discharge date: 11/21/2012   Procedures:  Procedure(s) (LRB): CONVERT LEFT UNI KNEE ARTHROPLASTY TO LEFT TOTAL KNEE ARTHROPLASTY (Left)  Attending Physician:  Dr. Durene Romans   Admission Diagnoses:   Left knee pain S/P unicompartmental knee arthroplasty.  Discharge Diagnoses:  Principal Problem:   S/P left UKA to TKA Active Problems:   Expected blood loss anemia, postoperative   Overweight (BMI 25.0-29.9)  Past Medical History  Diagnosis Date  . No pertinent past medical history   . Arthritis     HPI:    Harry Cobb, 52 y.o. male, has a history of pain and functional disability in the left knee(s) due to failed previous arthroplasty and patient has failed non-surgical conservative treatments for greater than 12 weeks to include NSAID's and/or analgesics, corticosteriod injections and activity modification. The indications for the revision of the total knee arthroplasty are pain. Onset of symptoms was gradual starting 5+ years ago with gradually worsening course since that time. Prior procedures on the left knee(s) include unicompartmental arthroplasty. Patient currently rates pain in the left knee(s) at 10 out of 10 with activity. There is worsening of pain with activity and weight bearing, pain that interferes with activities of daily living, pain with passive range of motion and joint swelling. Patient has evidence of previous UKR by imaging studies. This condition presents safety issues increasing the risk of falls. This patient has had failure of unicompartmental arthroplasty. There is no current active infection. Risks, benefits and expectations were discussed with the patient. Patient understand the risks, benefits and expectations and wishes to proceed with surgery.   PCP: Cain Saupe, MD   Discharged Condition: good  Hospital Course:  Patient  underwent the above stated procedure on 11/20/2012. Patient tolerated the procedure well and brought to the recovery room in good condition and subsequently to the floor.  POD #1 BP: 111/73 ; Pulse: 69 ; Temp: 97.7 F (36.5 C) ; Resp: 16  Pt's foley was removed, as well as the hemovac drain removed. IV was changed to a saline lock. Patient reports pain as mild, pain well controlled. No events throughout the night. Ready to be discharged home if he does well with PT and pain stays well controlled. Neurovascular intact, dorsiflexion/plantar flexion intact, incision: dressing C/D/I, no cellulitis present and compartment soft.   LABS  Basename  11/21/12    0415   HGB  12.8  HCT  36.2    Discharge Exam: General appearance: alert, cooperative and no distress Extremities: Homans sign is negative, no sign of DVT, no edema, redness or tenderness in the calves or thighs and no ulcers, gangrene or trophic changes  Disposition:   Home-Health Care Svc with follow up in 2 weeks   Follow-up Information   Follow up with Shelda Pal, MD. Schedule an appointment as soon as possible for a visit in 2 weeks.   Contact information:   [address] [place] [address] [phone]       Discharge Orders   Future Orders Complete By Expires     Call MD / Call 911  As directed     Comments:      If you experience chest pain or shortness of breath, CALL 911 and be transported to the hospital emergency room.  If you develope a fever above 101 F, pus (white drainage) or increased drainage or redness at the  wound, or calf pain, call your surgeon's office.    Change dressing  As directed     Comments:      Maintain surgical dressing for 10-14 days, then change the dressing daily with sterile 4 x 4 inch gauze dressing and tape. Keep the area dry and clean.    Constipation Prevention  As directed     Comments:      Drink plenty of fluids.  Prune juice may be helpful.  You may use a stool  softener, such as Colace (over the counter) 100 mg twice a day.  Use MiraLax (over the counter) for constipation as needed.    Diet - low sodium heart healthy  As directed     Discharge instructions  As directed     Comments:      Maintain surgical dressing for 10-14 days, then replace with gauze and tape. Keep the area dry and clean until follow up. Follow up in 2 weeks at [facility]. Call with any questions or concerns.    Increase activity slowly as tolerated  As directed     TED hose  As directed     Comments:      Use stockings (TED hose) for 2 weeks on both leg(s).  You may remove them at night for sleeping.    Weight bearing as tolerated  As directed          Medication List    STOP taking these medications       diclofenac 50 MG EC tablet  Commonly known as:  VOLTAREN      TAKE these medications       aspirin 325 MG EC tablet  Take 1 tablet (325 mg total) by mouth 2 (two) times daily.     celecoxib 200 MG capsule  Commonly known as:  CELEBREX  Take 1 capsule (200 mg total) by mouth every 12 (twelve) hours.     DSS 100 MG Caps  Take 100 mg by mouth 2 (two) times daily.     ferrous sulfate 325 (65 FE) MG tablet  Take 1 tablet (325 mg total) by mouth 3 (three) times daily after meals.     HYDROcodone-acetaminophen 7.5-325 MG per tablet  Commonly known as:  NORCO  Take 1-2 tablets by mouth every 4 (four) hours as needed for pain.     polyethylene glycol packet  Commonly known as:  MIRALAX / GLYCOLAX  Take 17 g by mouth 2 (two) times daily.     tiZANidine 4 MG capsule  Commonly known as:  ZANAFLEX  Take 1 capsule (4 mg total) by mouth 3 (three) times daily. Muscle spasms         Signed: Anastasio Auerbach. Imojean Yoshino   PAC  11/24/2012, 9:20 AM

## 2013-04-26 ENCOUNTER — Other Ambulatory Visit: Payer: Self-pay

## 2013-08-21 IMAGING — CR DG FOOT COMPLETE 3+V*L*
3 series · 3 of 3 positions shown · non-contrast
Comparison: None

CLINICAL DATA: Left foot pain

LEFT FOOT - COMPLETE 3+ VIEW

[t foot ap left]
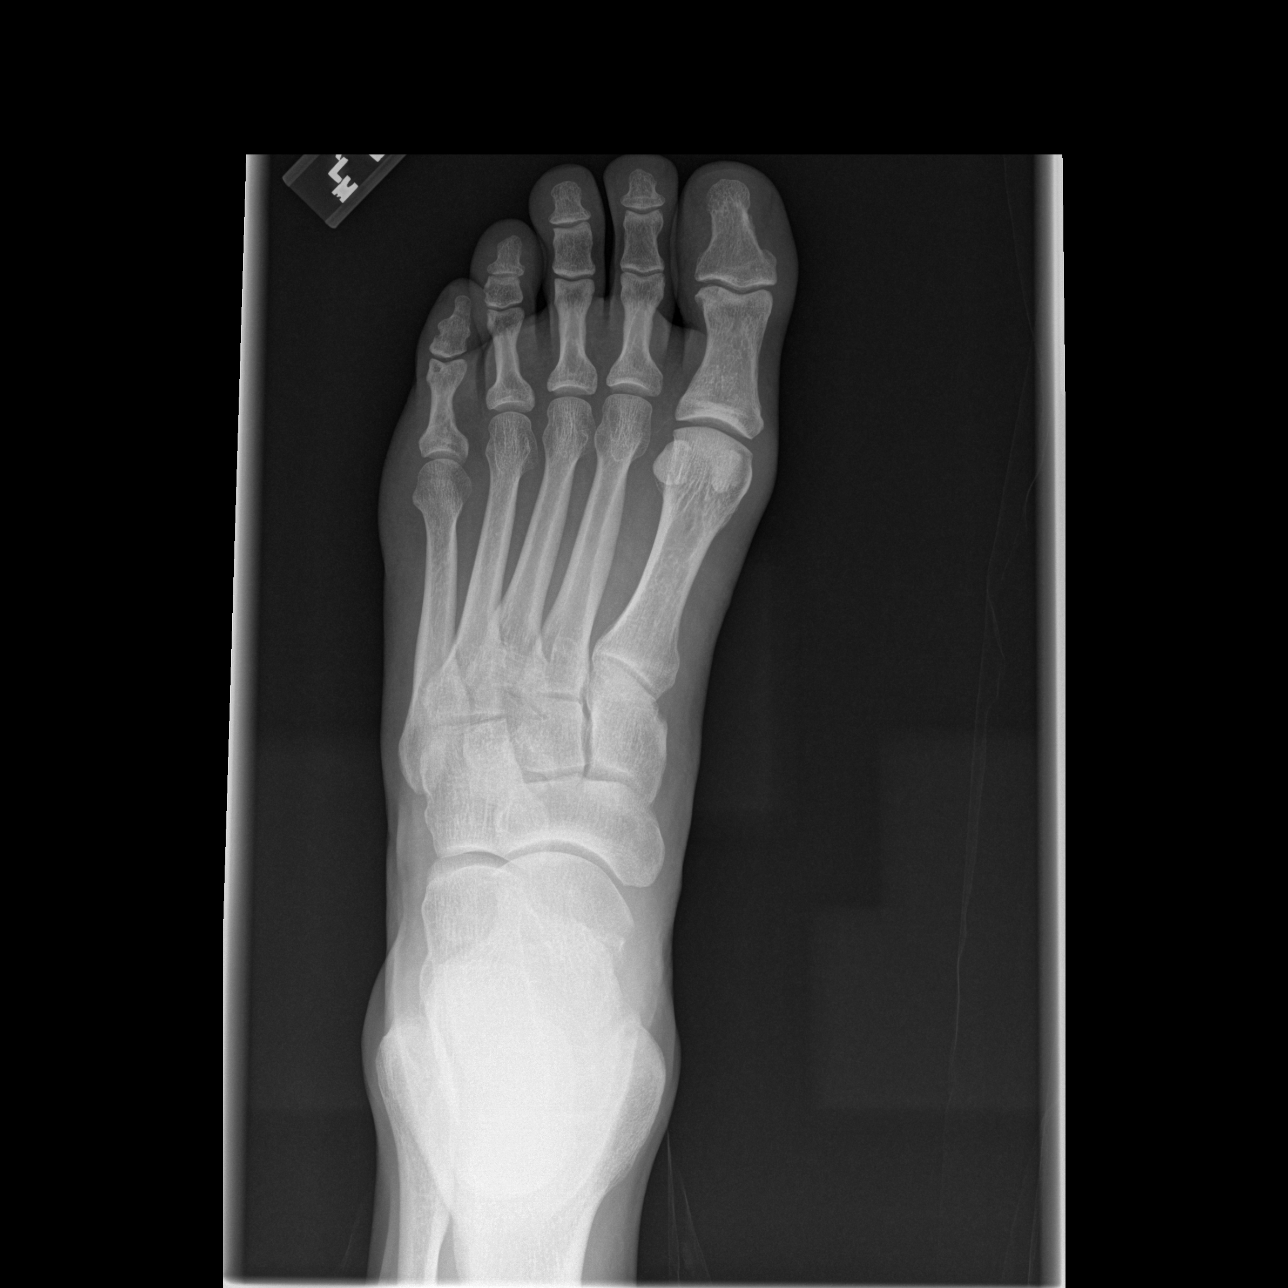

[t foot oblique left]
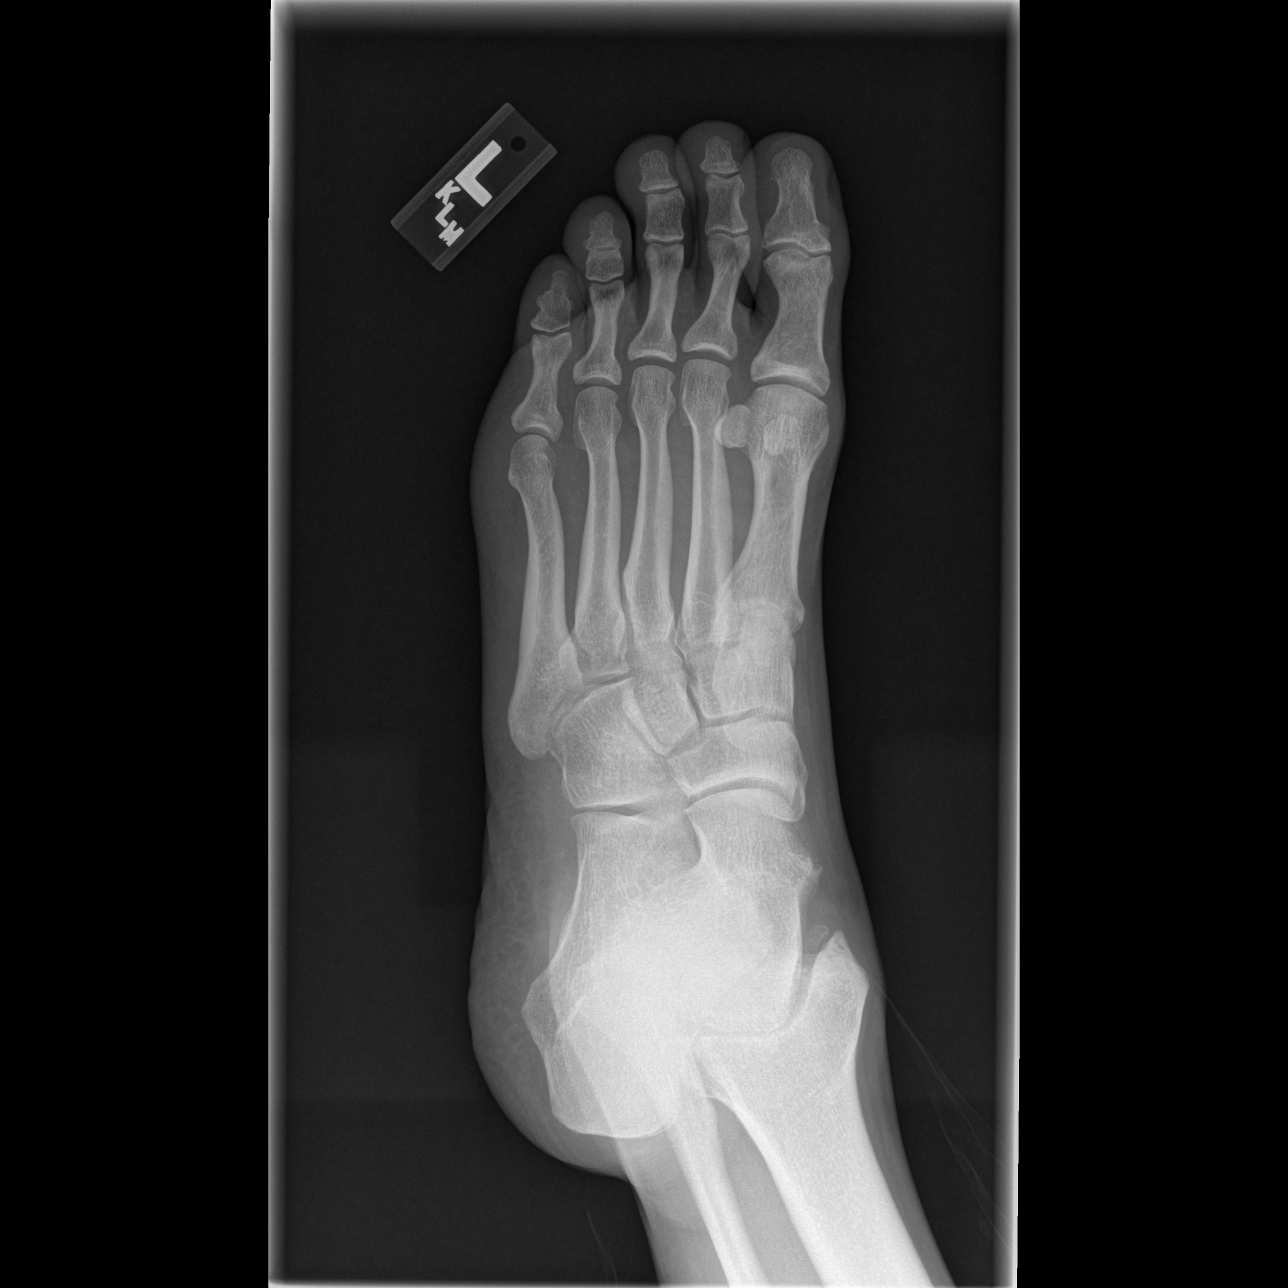

[t foot lat left]
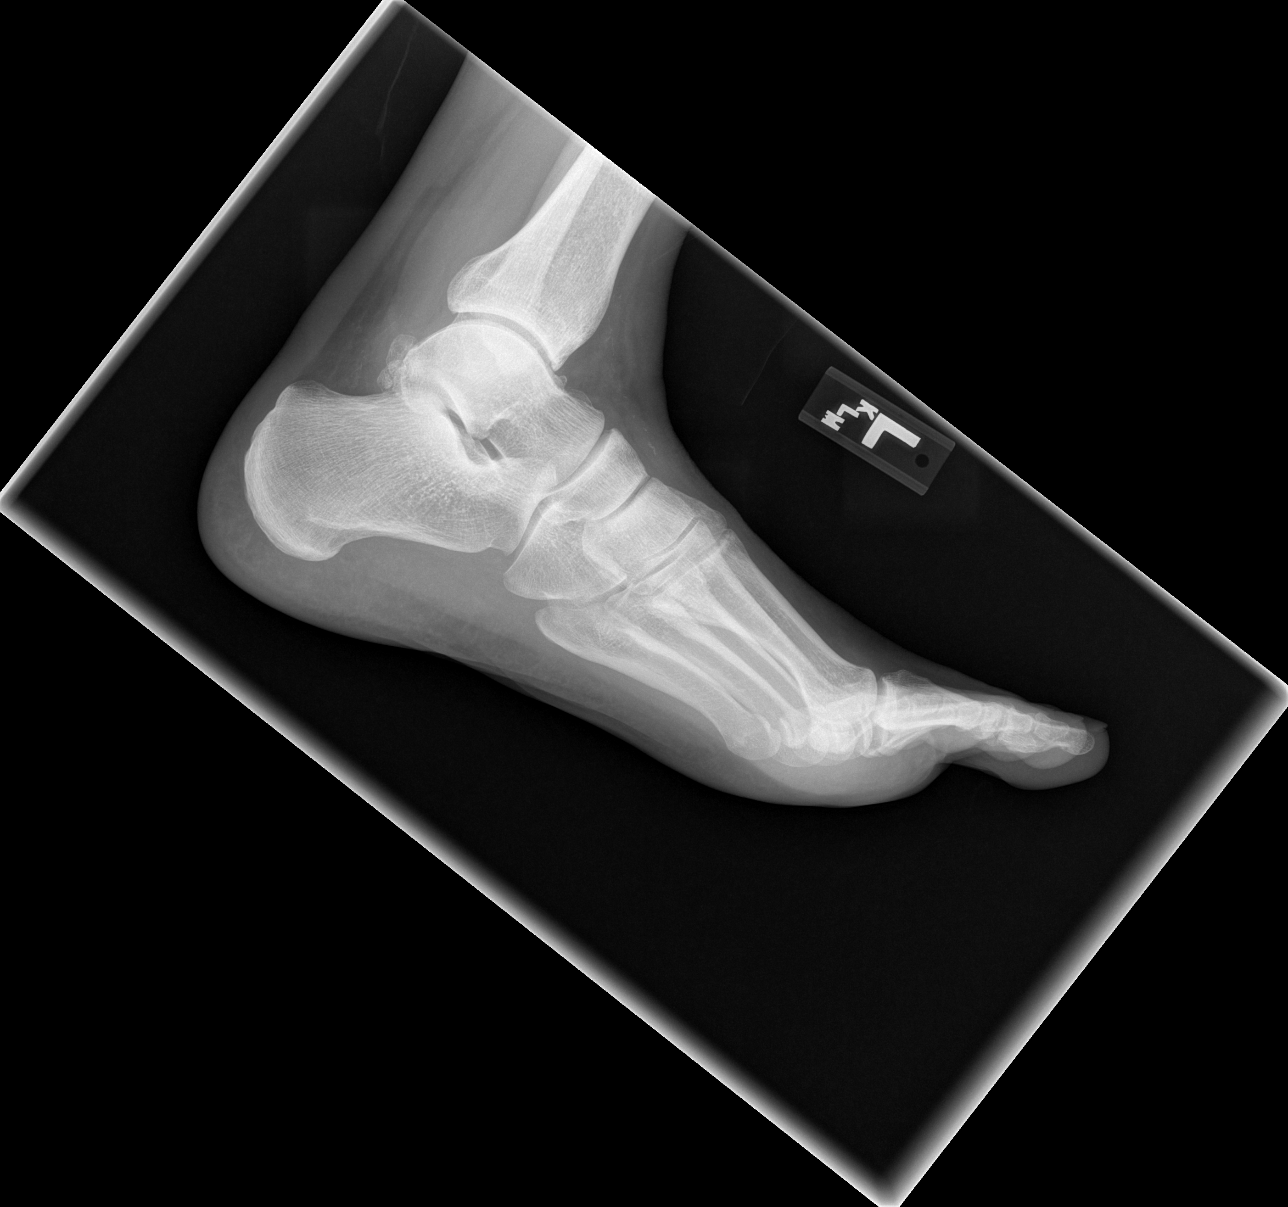

[3 of 3 positions shown; findings below may reference images not displayed]

FINDINGS: Osseous mineralization normal.
Joint spaces preserved.
No acute fracture, dislocation or bone destruction.
Few scattered accessory ossicles.
No significant bony abnormalities identified.
IMPRESSION: No acute bony abnormalities.

## 2013-08-21 IMAGING — CR DG LUMBAR SPINE 2-3V
3 series · 3 of 3 positions shown · non-contrast
Comparison: None

CLINICAL DATA: Low back pain, prior surgery

LUMBAR SPINE - 2-3 VIEW

[t l-spine a.p.]
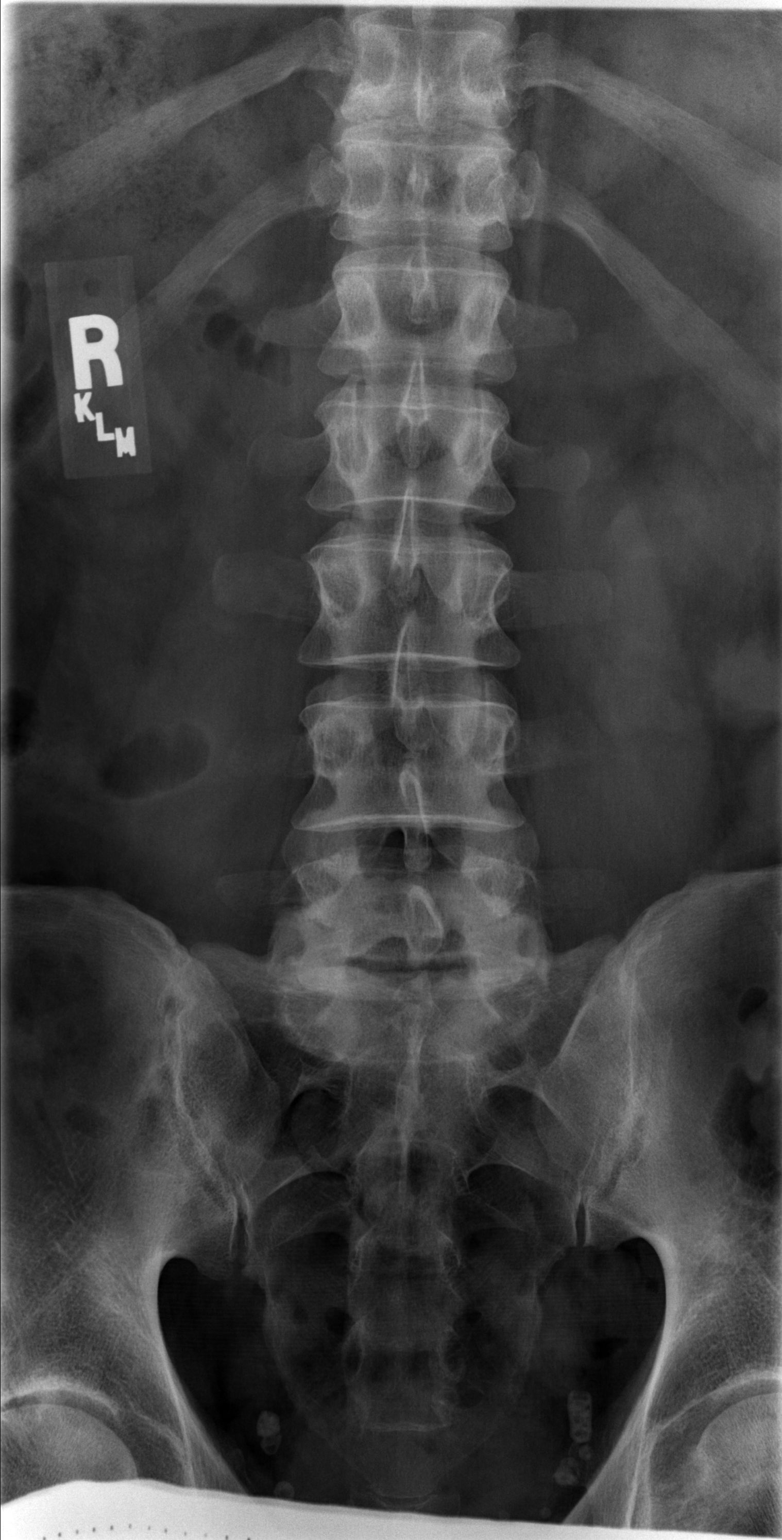

[t l-spine lat]
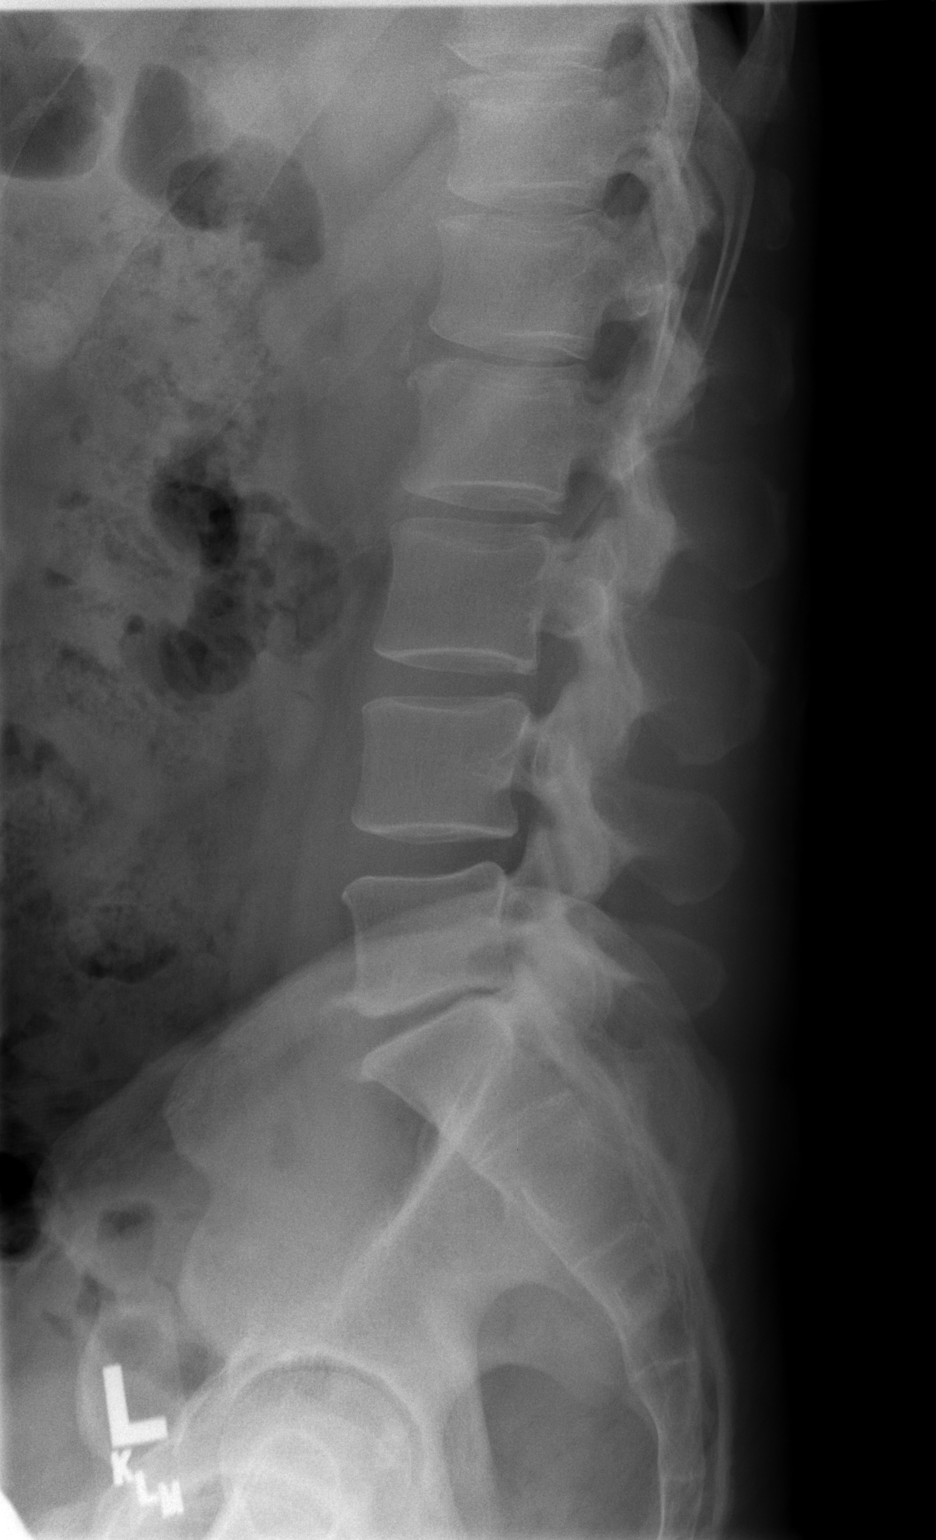

[t l-spine l5-s1 spot]
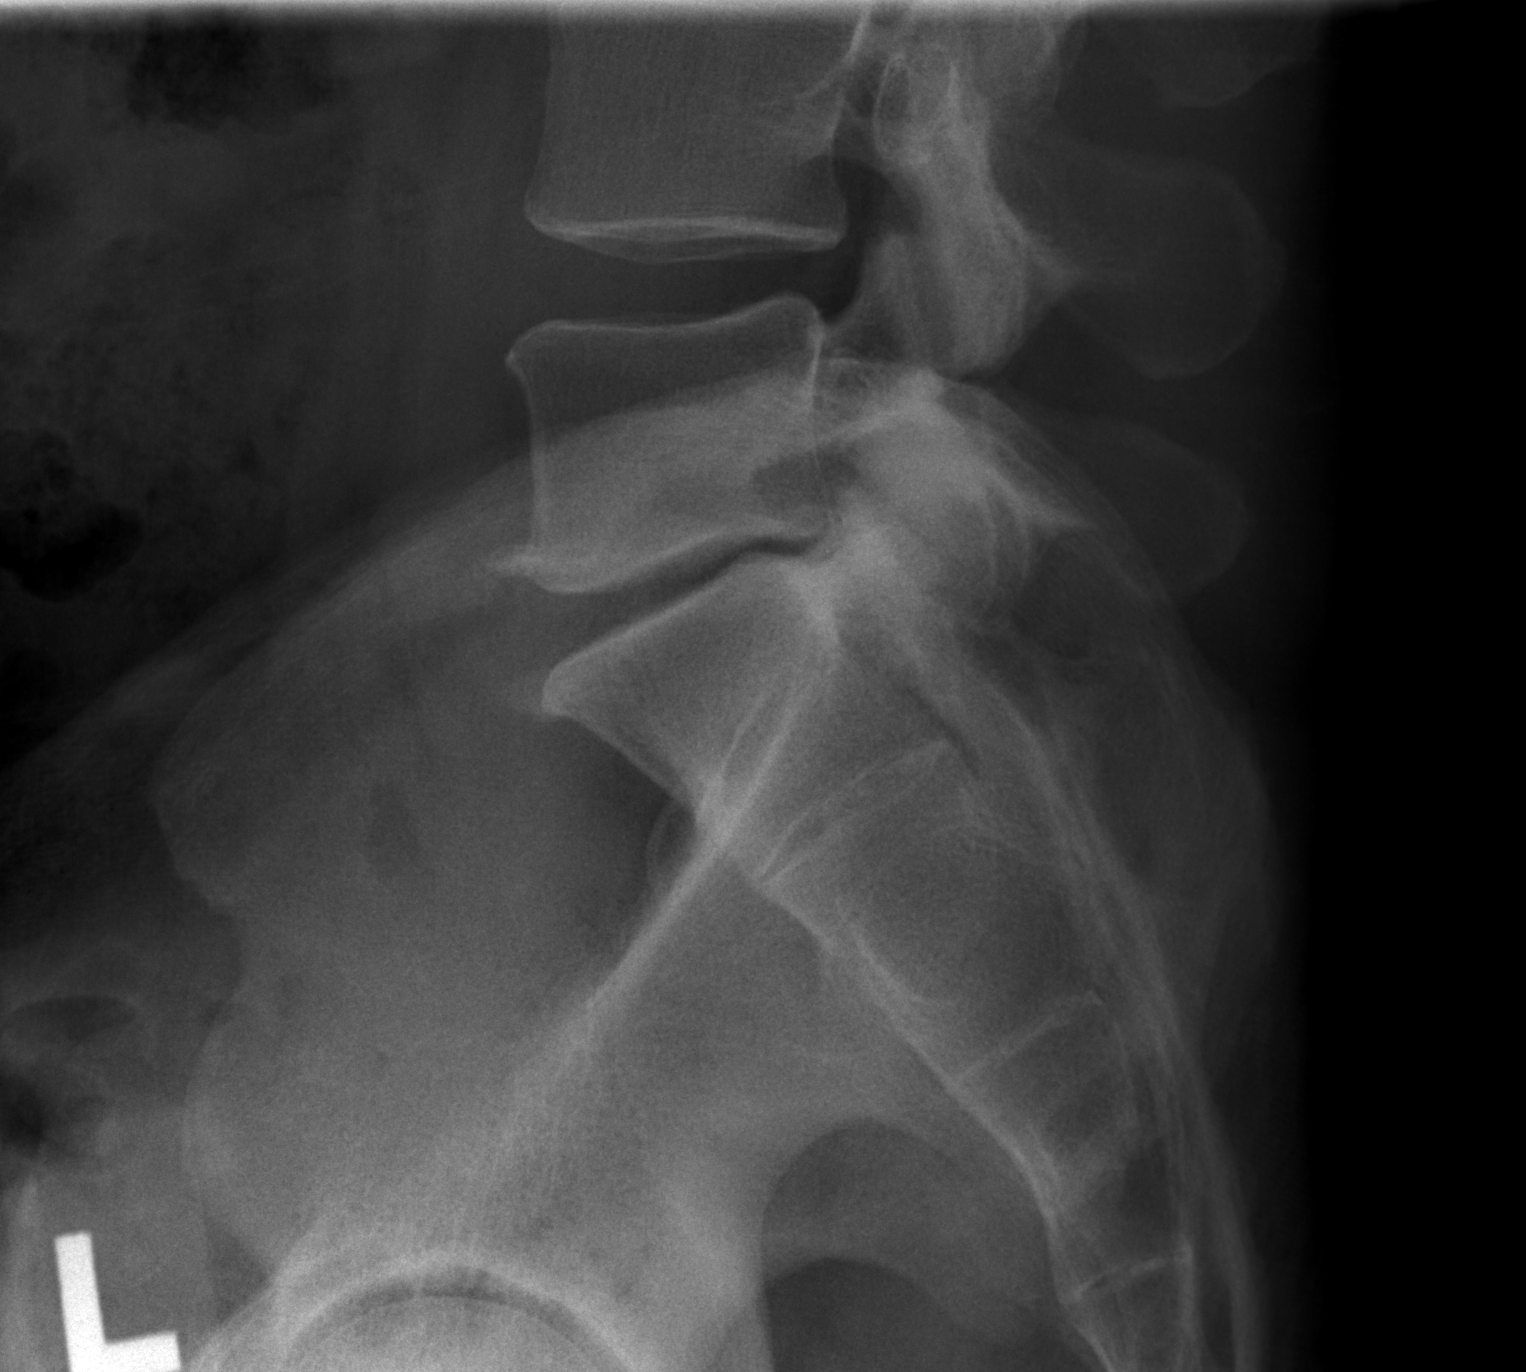

[3 of 3 positions shown; findings below may reference images not displayed]

FINDINGS: Five non-rib bearing lumbar vertebrae.
Disc space narrowing with minimal end plate spur formation and
retrolisthesis L5-S1.
Remaining vertebral body and disc space heights maintained.
No acute fracture, additional subluxation, or bone destruction.
No gross evidence of spondylolysis on AP/lateral exam.
SI joints symmetric.
IMPRESSION: Degenerative disc disease changes L5-S1 with joint space narrowing,
endplate spur formation and retrolisthesis.
No additional focal bony abnormalities identified.

## 2015-07-31 NOTE — H&P (Signed)
TOTAL HIP ADMISSION H&P  Patient is admitted for left total hip arthroplasty, anterior approach.  Subjective:  Chief Complaint:    Left hip primary OA / pain  HPI: Harry Cobb, 55 y.o. male, has a history of pain and functional disability in the left hip(s) due to arthritis and patient has failed non-surgical conservative treatments for greater than 12 weeks to include NSAID's and/or analgesics, corticosteriod injections and activity modification.  Onset of symptoms was abrupt starting 2-3 years ago with rapidlly worsening course since that time.The patient noted no past surgery on the left hip(s).  Patient currently rates pain in the left hip at 10 out of 10 with activity. Patient has night pain, worsening of pain with activity and weight bearing, trendelenberg gait, pain that interfers with activities of daily living and pain with passive range of motion. Patient has evidence of periarticular osteophytes and joint space narrowing by imaging studies. This condition presents safety issues increasing the risk of falls.  There is no current active infection.   Risks, benefits and expectations were discussed with the patient.  Risks including but not limited to the risk of anesthesia, blood clots, nerve damage, blood vessel damage, failure of the prosthesis, infection and up to and including death.  Patient understand the risks, benefits and expectations and wishes to proceed with surgery.    PCP: Antony Blackbird, MD  D/C Plans:      Home  Post-op Meds:       No Rx given  Tranexamic Acid:      To be given - IV   Decadron:      Is to be given  FYI:     ASA  Norco    Patient Active Problem List   Diagnosis Date Noted  . Expected blood loss anemia, postoperative 11/21/2012  . Overweight (BMI 25.0-29.9) 11/21/2012  . S/P left UKA to TKA 11/20/2012  . Umbilical hernia [id number]   Past Medical History  Diagnosis Date  . No pertinent past medical history   . Arthritis     Past Surgical  History  Procedure Laterality Date  . Knee surgery  2009/2011    Rt and Lt knee replacment  . Back surgery  2009  . Biceps tendon repair  2011  . Knee arthroscopy  11/12    lt  . Joint replacement      both partial knee replacements  . Umbilical hernia repair  06/04/2011    Procedure: HERNIA REPAIR UMBILICAL ADULT;  Surgeon: Rolm Bookbinder, MD;  Location: [facility];  Service: General;  Laterality: N/A;  umbilical hernia repiar and mesh and removal of lympoma on right shoulder  . Shoulder cyst  06/04/11    right cyst removed  . Tonsillectomy    . Total knee arthroplasty Left 11/20/2012    Procedure: CONVERT LEFT UNI KNEE ARTHROPLASTY TO LEFT TOTAL KNEE ARTHROPLASTY;  Surgeon: Mauri Pole, MD;  Location: [facility];  Service: Orthopedics;  Laterality: Left;    No prescriptions prior to admission   No Known Allergies   Social History  Substance Use Topics  . Smoking status: Never Smoker   . Smokeless tobacco: Never Used  . Alcohol Use: Yes     Comment: occassionally       Review of Systems  Constitutional: Negative.   HENT: Negative.   Eyes: Negative.   Respiratory: Negative.   Cardiovascular: Negative.   Gastrointestinal: Positive for constipation (occasionally).  Genitourinary: Negative.   Musculoskeletal: Positive for joint pain.  Skin: Negative.   Neurological: Negative.   Endo/Heme/Allergies: Negative.   Psychiatric/Behavioral: Negative.     Objective:  Physical Exam  Constitutional: He is oriented to person, place, and time. He appears well-developed.  HENT:  Head: Normocephalic.  Eyes: Pupils are equal, round, and reactive to light.  Neck: Neck supple. No JVD present. No tracheal deviation present. No thyromegaly present.  Cardiovascular: Normal rate, regular rhythm, normal heart sounds and intact distal pulses.   Respiratory: Effort normal and breath sounds normal. No stridor. No respiratory distress. He has no wheezes.  GI: Soft. There is  no tenderness. There is no guarding.  Musculoskeletal:       Left hip: He exhibits decreased range of motion, decreased strength, tenderness and bony tenderness. He exhibits no swelling, no deformity and no laceration.  Lymphadenopathy:    He has no cervical adenopathy.  Neurological: He is alert and oriented to person, place, and time.  Skin: Skin is warm and dry.  Psychiatric: He has a normal mood and affect.      Labs:  Estimated body mass index is 29.30 kg/(m^2) as calculated from the following:   Height as of 11/20/12: 5\' 11"  (1.803 m).   Weight as of 11/08/12: 95.255 kg (210 lb).   Imaging Review Plain radiographs demonstrate severe degenerative joint disease of the left hip(s). The bone quality appears to be good for age and reported activity level.  Assessment/Plan:  End stage arthritis, left hip(s)  The patient history, physical examination, clinical judgement of the provider and imaging studies are consistent with end stage degenerative joint disease of the left hip(s) and total hip arthroplasty is deemed medically necessary. The treatment options including medical management, injection therapy, arthroscopy and arthroplasty were discussed at length. The risks and benefits of total hip arthroplasty were presented and reviewed. The risks due to aseptic loosening, infection, stiffness, dislocation/subluxation,  thromboembolic complications and other imponderables were discussed.  The patient acknowledged the explanation, agreed to proceed with the plan and consent was signed. Patient is being admitted for inpatient treatment for surgery, pain control, PT, OT, prophylactic antibiotics, VTE prophylaxis, progressive ambulation and ADL's and discharge planning.The patient is planning to be discharged home.     Harry Pugh Carry Weesner   PA-C  07/31/2015, 3:26 PM

## 2015-08-04 ENCOUNTER — Encounter (HOSPITAL_COMMUNITY): Admission: RE | Admit: 2015-08-04 | Payer: PRIVATE HEALTH INSURANCE | Source: Ambulatory Visit

## 2015-08-05 ENCOUNTER — Encounter (HOSPITAL_COMMUNITY): Payer: Self-pay

## 2015-08-05 ENCOUNTER — Encounter (HOSPITAL_COMMUNITY)
Admission: RE | Admit: 2015-08-05 | Discharge: 2015-08-05 | Disposition: A | Discharge status: Home / Self Care | Payer: BLUE CROSS/BLUE SHIELD | Source: Ambulatory Visit | Attending: Orthopedic Surgery | Admitting: Orthopedic Surgery

## 2015-08-05 DIAGNOSIS — Z0183 Encounter for blood typing: Secondary | ICD-10-CM | POA: Insufficient documentation

## 2015-08-05 DIAGNOSIS — Z01812 Encounter for preprocedural laboratory examination: Secondary | ICD-10-CM | POA: Diagnosis present

## 2015-08-05 DIAGNOSIS — M1612 Unilateral primary osteoarthritis, left hip: Secondary | ICD-10-CM | POA: Diagnosis not present

## 2015-08-05 LAB — URINALYSIS, ROUTINE W REFLEX MICROSCOPIC
BILIRUBIN URINE: NEGATIVE
Glucose, UA: NEGATIVE mg/dL
HGB URINE DIPSTICK: NEGATIVE
KETONES UR: NEGATIVE mg/dL
Leukocytes, UA: NEGATIVE
NITRITE: NEGATIVE
PH: 5 (ref 5.0–8.0)
Protein, ur: NEGATIVE mg/dL
Specific Gravity, Urine: 1.018 (ref 1.005–1.030)

## 2015-08-05 LAB — SURGICAL PCR SCREEN
MRSA, PCR: NEGATIVE
STAPHYLOCOCCUS AUREUS: NEGATIVE

## 2015-08-05 LAB — PROTIME-INR
INR: 1.05 (ref 0.00–1.49)
PROTHROMBIN TIME: 13.9 s (ref 11.6–15.2)

## 2015-08-05 LAB — APTT: aPTT: 30 seconds (ref 24–37)

## 2015-08-05 LAB — TYPE AND SCREEN
ABO/RH(D): O NEG
ANTIBODY SCREEN: NEGATIVE

## 2015-08-05 NOTE — Patient Instructions (Signed)
Harry Cobb  08/05/2015   Your procedure is scheduled on:   Report to [facility] Main  Entrance take [facility]  elevators to 3rd floor to  [place] at AM.  Call this number if you have problems the morning of surgery [phone]   Remember: ONLY 1 PERSON MAY GO WITH YOU TO SHORT STAY TO GET  READY MORNING OF [place].  Do not eat food or drink liquids :After Midnight.     Take these medicines the morning of surgery with A SIP OF WATER:  DO NOT TAKE ANY DIABETIC MEDICATIONS DAY OF YOUR SURGERY                               You may not have any metal on your body including hair pins and              piercings  Do not wear jewelry, make-up, lotions, powders or perfumes, deodorant             Do not wear nail polish.  Do not shave  48 hours prior to surgery.              Men may shave face and neck.   Do not bring valuables to the hospital. [place].  Contacts, dentures or bridgework may not be worn into surgery.  Leave suitcase in the car. After surgery it may be brought to your room.     Patients discharged the day of surgery will not be allowed to drive home.  Name and phone number of your driver:  Special Instructions: N/A              Please read over the following fact sheets you were given: _____________________________________________________________________                            Harry Cobb  08/05/2015   Your procedure is scheduled on: 08/12/2015    Report to [facility] Main  Entrance take [facility]  elevators to 3rd floor to  [place] at   [place] AM.  Call this number if you have problems the morning of surgery [phone]   Remember: ONLY 1 PERSON MAY GO WITH YOU TO SHORT STAY TO GET  READY MORNING OF [place].  Do not eat food or drink liquids :After Midnight.     Take these medicines the morning of surgery with A SIP OF WATER: none                                  You may not have any metal on your body including hair pins and              piercings  Do not wear jewelry, , lotions, powders or perfumes, deodorant                      Men may shave face and neck.   Do not bring valuables to the hospital. [place]  VALUABLES.  Contacts, dentures or bridgework may not be worn into surgery.  Leave suitcase in the car. After surgery it may be brought to your room.     Special Instructions: coughing and deep breathing exercises, leg exercises                Please read over the following fact sheets you were given: _____________________________________________________________________             [facility] - Preparing for Surgery Before surgery, you can play an important role.  Because skin is not sterile, your skin needs to be as free of germs as possible.  You can reduce the number of germs on your skin by washing with CHG (chlorahexidine gluconate) soap before surgery.  CHG is an antiseptic cleaner which kills germs and bonds with the skin to continue killing germs even after washing. Please DO NOT use if you have an allergy to CHG or antibacterial soaps.  If your skin becomes reddened/irritated stop using the CHG and inform your nurse when you arrive at Short Stay. Do not shave (including legs and underarms) for at least 48 hours prior to the first CHG shower.  You may shave your face/neck. Please follow these instructions carefully:  1.  Shower with CHG Soap the night before surgery and the  morning of Surgery.  2.  If you choose to wash your hair, wash your hair first as usual with your  normal  shampoo.  3.  After you shampoo, rinse your hair and body thoroughly to remove the  shampoo.                           4.  Use CHG as you would any other liquid soap.  You can apply chg directly  to the skin and wash                       Gently with a scrungie or clean washcloth.  5.  Apply the CHG Soap  to your body ONLY FROM THE NECK DOWN.   Do not use on face/ open                           Wound or open sores. Avoid contact with eyes, ears mouth and genitals (private parts).                       Wash face,  Genitals (private parts) with your normal soap.             6.  Wash thoroughly, paying special attention to the area where your surgery  will be performed.  7.  Thoroughly rinse your body with warm water from the neck down.  8.  DO NOT shower/wash with your normal soap after using and rinsing off  the CHG Soap.                9.  Pat yourself dry with a clean towel.            10.  Wear clean pajamas.            11.  Place clean sheets on your bed the night of your first shower and do not  sleep with pets. Day of Surgery : Do not apply any lotions/deodorants the morning of surgery.  Please wear clean clothes to the hospital/surgery center.  FAILURE TO FOLLOW THESE INSTRUCTIONS MAY RESULT IN THE CANCELLATION OF YOUR SURGERY PATIENT SIGNATURE_________________________________  NURSE SIGNATURE__________________________________  ________________________________________________________________________ [facility] - Preparing for Surgery Before surgery, you can play an important role.  Because skin is not sterile, your skin needs to be as free of germs as possible.  You can reduce the number of germs on your skin by washing with CHG (chlorahexidine gluconate) soap before surgery.  CHG is an antiseptic cleaner which kills germs and bonds with the skin to continue killing germs even after washing. Please DO NOT use if you have an allergy to CHG or antibacterial soaps.  If your skin becomes reddened/irritated stop using the CHG and inform your nurse when you arrive at Short Stay. Do not shave (including legs and underarms) for at least 48 hours prior to the first CHG shower.  You may shave your face/neck. Please follow these instructions carefully:  1.  Shower with CHG Soap the night before  surgery and the  morning of Surgery.  2.  If you choose to wash your hair, wash your hair first as usual with your  normal  shampoo.  3.  After you shampoo, rinse your hair and body thoroughly to remove the  shampoo.                           4.  Use CHG as you would any other liquid soap.  You can apply chg directly  to the skin and wash                       Gently with a scrungie or clean washcloth.  5.  Apply the CHG Soap to your body ONLY FROM THE NECK DOWN.   Do not use on face/ open                           Wound or open sores. Avoid contact with eyes, ears mouth and genitals (private parts).                       Wash face,  Genitals (private parts) with your normal soap.             6.  Wash thoroughly, paying special attention to the area where your surgery  will be performed.  7.  Thoroughly rinse your body with warm water from the neck down.  8.  DO NOT shower/wash with your normal soap after using and rinsing off  the CHG Soap.                9.  Pat yourself dry with a clean towel.            10.  Wear clean pajamas.            11.  Place clean sheets on your bed the night of your first shower and do not  sleep with pets. Day of Surgery : Do not apply any lotions/deodorants the morning of surgery.  Please wear clean clothes to the hospital/surgery center.  FAILURE TO FOLLOW THESE INSTRUCTIONS MAY RESULT IN THE CANCELLATION OF YOUR SURGERY PATIENT SIGNATURE_________________________________  NURSE SIGNATURE__________________________________  ________________________________________________________________________   Harry Cobb  An incentive spirometer is a tool that can help keep your lungs clear and active. This tool measures how well you are filling your lungs with each breath. Taking long deep breaths may  help reverse or decrease the chance of developing breathing (pulmonary) problems (especially infection) following:  A long period of time when you are unable to  move or be active. BEFORE THE PROCEDURE   If the spirometer includes an indicator to show your best effort, your nurse or respiratory therapist will set it to a desired goal.  If possible, sit up straight or lean slightly forward. Try not to slouch.  Hold the incentive spirometer in an upright position. INSTRUCTIONS FOR USE   Sit on the edge of your bed if possible, or sit up as far as you can in bed or on a chair.  Hold the incentive spirometer in an upright position.  Breathe out normally.  Place the mouthpiece in your mouth and seal your lips tightly around it.  Breathe in slowly and as deeply as possible, raising the piston or the ball toward the top of the column.  Hold your breath for 3-5 seconds or for as long as possible. Allow the piston or ball to fall to the bottom of the column.  Remove the mouthpiece from your mouth and breathe out normally.  Rest for a few seconds and repeat Steps 1 through 7 at least 10 times every 1-2 hours when you are awake. Take your time and take a few normal breaths between deep breaths.  The spirometer may include an indicator to show your best effort. Use the indicator as a goal to work toward during each repetition.  After each set of 10 deep breaths, practice coughing to be sure your lungs are clear. If you have an incision (the cut made at the time of surgery), support your incision when coughing by placing a pillow or rolled up towels firmly against it. Once you are able to get out of bed, walk around indoors and cough well. You may stop using the incentive spirometer when instructed by your caregiver.  RISKS AND COMPLICATIONS  Take your time so you do not get dizzy or light-headed.  If you are in pain, you may need to take or ask for pain medication before doing incentive spirometry. It is harder to take a deep breath if you are having pain. AFTER USE  Rest and breathe slowly and easily.  It can be helpful to keep track of a log of  your progress. Your caregiver can provide you with a simple table to help with this. If you are using the spirometer at home, follow these instructions: [place] IF:   You are having difficultly using the spirometer.  You have trouble using the spirometer as often as instructed.  Your pain medication is not giving enough relief while using the spirometer.  You develop fever of 100.5 F (38.1 C) or higher. SEEK IMMEDIATE MEDICAL CARE IF:   You cough up bloody sputum that had not been present before.  You develop fever of 102 F (38.9 C) or greater.  You develop worsening pain at or near the incision site. MAKE SURE YOU:   Understand these instructions.  Will watch your condition.  Will get help right away if you are not doing well or get worse. Document Released: 10/18/2006 Document Revised: 08/30/2011 Document Reviewed: 12/19/2006 Exit[employer] Patient Information 2014 Exit[employer], [place].   ________________________________________________________________________  WHAT IS A BLOOD TRANSFUSION? Blood Transfusion Information  A transfusion is the replacement of blood or some of its parts. Blood is made up of multiple cells which provide different functions.  Red blood cells carry oxygen and are used for  blood loss replacement.  White blood cells fight against infection.  Platelets control bleeding.  Plasma helps clot blood.  Other blood products are available for specialized needs, such as hemophilia or other clotting disorders. BEFORE THE TRANSFUSION  Who gives blood for transfusions?   Healthy volunteers who are fully evaluated to make sure their blood is safe. This is blood bank blood. Transfusion therapy is the safest it has ever been in the practice of medicine. Before blood is taken from a donor, a complete history is taken to make sure that person has no history of diseases nor engages in risky social behavior (examples are intravenous drug use or sexual activity  with multiple partners). The donor's travel history is screened to minimize risk of transmitting infections, such as malaria. The donated blood is tested for signs of infectious diseases, such as HIV and hepatitis. The blood is then tested to be sure it is compatible with you in order to minimize the chance of a transfusion reaction. If you or a relative donates blood, this is often done in anticipation of surgery and is not appropriate for emergency situations. It takes many days to process the donated blood. RISKS AND COMPLICATIONS Although transfusion therapy is very safe and saves many lives, the main dangers of transfusion include:   Getting an infectious disease.  Developing a transfusion reaction. This is an allergic reaction to something in the blood you were given. Every precaution is taken to prevent this. The decision to have a blood transfusion has been considered carefully by your caregiver before blood is given. Blood is not given unless the benefits outweigh the risks. AFTER THE TRANSFUSION  Right after receiving a blood transfusion, you will usually feel much better and more energetic. This is especially true if your red blood cells have gotten low (anemic). The transfusion raises the level of the red blood cells which carry oxygen, and this usually causes an energy increase.  The nurse administering the transfusion will monitor you carefully for complications. HOME CARE INSTRUCTIONS  No special instructions are needed after a transfusion. You may find your energy is better. Speak with your caregiver about any limitations on activity for underlying diseases you may have. SEEK MEDICAL CARE IF:   Your condition is not improving after your transfusion.  You develop redness or irritation at the intravenous (IV) site. SEEK IMMEDIATE MEDICAL CARE IF:  Any of the following symptoms occur over the next 12 hours:  Shaking chills.  You have a temperature by mouth above 102 F (38.9  C), not controlled by medicine.  Chest, back, or muscle pain.  People around you feel you are not acting correctly or are confused.  Shortness of breath or difficulty breathing.  Dizziness and fainting.  You get a rash or develop hives.  You have a decrease in urine output.  Your urine turns a dark color or changes to pink, red, or brown. Any of the following symptoms occur over the next 10 days:  You have a temperature by mouth above 102 F (38.9 C), not controlled by medicine.  Shortness of breath.  Weakness after normal activity.  The white part of the eye turns yellow (jaundice).  You have a decrease in the amount of urine or are urinating less often.  Your urine turns a dark color or changes to pink, red, or brown. Document Released: 06/04/2000 Document Revised: 08/30/2011 Document Reviewed: 01/22/2008 [facility] Patient Information 2014 [place], [place].  _______________________________________________________________________

## 2015-08-05 NOTE — Progress Notes (Signed)
CBC/DIFF and CMp done 07/14/2015 on chart.

## 2015-08-05 NOTE — Progress Notes (Signed)
CBC and CMP done 07/14/15 on chart

## 2015-08-12 ENCOUNTER — Inpatient Hospital Stay (HOSPITAL_COMMUNITY): Payer: BLUE CROSS/BLUE SHIELD | Admitting: Anesthesiology

## 2015-08-12 ENCOUNTER — Inpatient Hospital Stay (HOSPITAL_COMMUNITY): Payer: BLUE CROSS/BLUE SHIELD

## 2015-08-12 ENCOUNTER — Inpatient Hospital Stay (HOSPITAL_COMMUNITY)
Admission: RE | Admit: 2015-08-12 | Discharge: 2015-08-13 | DRG: 470 | Disposition: A | Discharge status: Home Health | Payer: BLUE CROSS/BLUE SHIELD | Source: Ambulatory Visit | Attending: Orthopedic Surgery | Admitting: Orthopedic Surgery

## 2015-08-12 ENCOUNTER — Encounter (HOSPITAL_COMMUNITY): Payer: Self-pay

## 2015-08-12 ENCOUNTER — Encounter (HOSPITAL_COMMUNITY)
Admission: RE | Disposition: A | Discharge status: Home Health | Payer: Self-pay | Source: Ambulatory Visit | Attending: Orthopedic Surgery

## 2015-08-12 DIAGNOSIS — Z96652 Presence of left artificial knee joint: Secondary | ICD-10-CM | POA: Diagnosis present

## 2015-08-12 DIAGNOSIS — M25552 Pain in left hip: Secondary | ICD-10-CM | POA: Diagnosis present

## 2015-08-12 DIAGNOSIS — M1612 Unilateral primary osteoarthritis, left hip: Secondary | ICD-10-CM | POA: Diagnosis present

## 2015-08-12 DIAGNOSIS — E663 Overweight: Secondary | ICD-10-CM | POA: Diagnosis present

## 2015-08-12 DIAGNOSIS — Z6828 Body mass index (BMI) 28.0-28.9, adult: Secondary | ICD-10-CM

## 2015-08-12 DIAGNOSIS — Z01812 Encounter for preprocedural laboratory examination: Secondary | ICD-10-CM | POA: Diagnosis not present

## 2015-08-12 DIAGNOSIS — Z96649 Presence of unspecified artificial hip joint: Secondary | ICD-10-CM

## 2015-08-12 HISTORY — PX: TOTAL HIP ARTHROPLASTY: SHX124

## 2015-08-12 SURGERY — ARTHROPLASTY, HIP, TOTAL, ANTERIOR APPROACH
Anesthesia: Spinal | Site: Hip | Laterality: Left

## 2015-08-12 MED ORDER — BISACODYL 10 MG RE SUPP: 10.0000 mg | Freq: Every day | RECTAL | Status: DC | PRN

## 2015-08-12 MED ORDER — ALBUMIN HUMAN 5 % IV SOLN
INTRAVENOUS | Status: AC
  Filled 2015-08-12: qty 250

## 2015-08-12 MED ORDER — ONDANSETRON HCL 4 MG PO TABS: 4.0000 mg | ORAL_TABLET | Freq: Four times a day (QID) | ORAL | Status: DC | PRN

## 2015-08-12 MED ORDER — CEFAZOLIN SODIUM-DEXTROSE 2-3 GM-% IV SOLR
2.0000 g | Freq: Four times a day (QID) | INTRAVENOUS | Status: AC
  Administered 2015-08-12 (×2): 2 g via INTRAVENOUS
  Filled 2015-08-12 (×2): qty 50

## 2015-08-12 MED ORDER — PHENOL 1.4 % MT LIQD: 1.0000 | OROMUCOSAL | Status: DC | PRN

## 2015-08-12 MED ORDER — DEXAMETHASONE SODIUM PHOSPHATE 10 MG/ML IJ SOLN
INTRAMUSCULAR | Status: AC
  Filled 2015-08-12: qty 1

## 2015-08-12 MED ORDER — PROPOFOL 10 MG/ML IV BOLUS
INTRAVENOUS | Status: AC
  Filled 2015-08-12: qty 20

## 2015-08-12 MED ORDER — CHLORHEXIDINE GLUCONATE 4 % EX LIQD: 60.0000 mL | Freq: Once | CUTANEOUS | Status: DC

## 2015-08-12 MED ORDER — GABAPENTIN 300 MG PO CAPS
300.0000 mg | ORAL_CAPSULE | Freq: Two times a day (BID) | ORAL | Status: DC
Start: 2015-08-12 — End: 2015-08-13
  Administered 2015-08-12 – 2015-08-13 (×2): 300 mg via ORAL
  Filled 2015-08-12 (×3): qty 1

## 2015-08-12 MED ORDER — BUPIVACAINE-EPINEPHRINE (PF) 0.25% -1:200000 IJ SOLN
INTRAMUSCULAR | Status: AC
  Filled 2015-08-12: qty 30

## 2015-08-12 MED ORDER — HYDROMORPHONE HCL 1 MG/ML IJ SOLN
0.2500 mg | INTRAMUSCULAR | Status: DC | PRN
  Administered 2015-08-12 (×4): 0.5 mg via INTRAVENOUS

## 2015-08-12 MED ORDER — HYDROCODONE-ACETAMINOPHEN 7.5-325 MG PO TABS
1.0000 | ORAL_TABLET | ORAL | Status: DC
  Administered 2015-08-12 – 2015-08-13 (×6): 2 via ORAL
  Filled 2015-08-12 (×6): qty 2

## 2015-08-12 MED ORDER — ONDANSETRON HCL 4 MG/2ML IJ SOLN: 4.0000 mg | Freq: Four times a day (QID) | INTRAMUSCULAR | Status: DC | PRN

## 2015-08-12 MED ORDER — MIDAZOLAM HCL 5 MG/5ML IJ SOLN
INTRAMUSCULAR | Status: DC | PRN
  Administered 2015-08-12: 2 mg via INTRAVENOUS

## 2015-08-12 MED ORDER — TRANEXAMIC ACID 1000 MG/10ML IV SOLN
1000.0000 mg | Freq: Once | INTRAVENOUS | Status: AC
  Administered 2015-08-12: 1000 mg via INTRAVENOUS
  Filled 2015-08-12: qty 10

## 2015-08-12 MED ORDER — SODIUM CHLORIDE 0.9 % IV SOLN
100.0000 mL/h | INTRAVENOUS | Status: DC
  Administered 2015-08-12: 100 mL/h via INTRAVENOUS
  Filled 2015-08-12 (×3): qty 1000

## 2015-08-12 MED ORDER — HYDROMORPHONE HCL 1 MG/ML IJ SOLN
INTRAMUSCULAR | Status: AC
  Filled 2015-08-12: qty 1

## 2015-08-12 MED ORDER — ASPIRIN EC 325 MG PO TBEC
325.0000 mg | DELAYED_RELEASE_TABLET | Freq: Two times a day (BID) | ORAL | Status: DC
  Administered 2015-08-13: 325 mg via ORAL
  Filled 2015-08-12 (×3): qty 1

## 2015-08-12 MED ORDER — FENTANYL CITRATE (PF) 100 MCG/2ML IJ SOLN
INTRAMUSCULAR | Status: DC | PRN
  Administered 2015-08-12: 50 ug via INTRAVENOUS

## 2015-08-12 MED ORDER — SODIUM CHLORIDE 0.9 % IJ SOLN
INTRAMUSCULAR | Status: AC
  Filled 2015-08-12: qty 50

## 2015-08-12 MED ORDER — MIDAZOLAM HCL 2 MG/2ML IJ SOLN
INTRAMUSCULAR | Status: AC
  Filled 2015-08-12: qty 2

## 2015-08-12 MED ORDER — BUPIVACAINE HCL (PF) 0.5 % IJ SOLN
INTRAMUSCULAR | Status: AC
  Filled 2015-08-12: qty 30

## 2015-08-12 MED ORDER — HYDROMORPHONE HCL 1 MG/ML IJ SOLN
0.5000 mg | INTRAMUSCULAR | Status: DC | PRN
  Administered 2015-08-12 – 2015-08-13 (×5): 1 mg via INTRAVENOUS
  Filled 2015-08-12 (×5): qty 1

## 2015-08-12 MED ORDER — CELECOXIB 200 MG PO CAPS
200.0000 mg | ORAL_CAPSULE | Freq: Two times a day (BID) | ORAL | Status: DC
  Administered 2015-08-13: 200 mg via ORAL
  Filled 2015-08-12 (×3): qty 1

## 2015-08-12 MED ORDER — FENTANYL CITRATE (PF) 100 MCG/2ML IJ SOLN
INTRAMUSCULAR | Status: AC
  Filled 2015-08-12: qty 2

## 2015-08-12 MED ORDER — CEFAZOLIN SODIUM-DEXTROSE 2-3 GM-% IV SOLR
INTRAVENOUS | Status: AC
  Filled 2015-08-12: qty 50

## 2015-08-12 MED ORDER — DEXAMETHASONE SODIUM PHOSPHATE 10 MG/ML IJ SOLN
10.0000 mg | Freq: Once | INTRAMUSCULAR | Status: AC
  Administered 2015-08-13: 10 mg via INTRAVENOUS
  Filled 2015-08-12: qty 1

## 2015-08-12 MED ORDER — FERROUS SULFATE 325 (65 FE) MG PO TABS
325.0000 mg | ORAL_TABLET | Freq: Three times a day (TID) | ORAL | Status: DC
  Administered 2015-08-13: 325 mg via ORAL
  Filled 2015-08-12 (×5): qty 1

## 2015-08-12 MED ORDER — PROPOFOL 10 MG/ML IV BOLUS
INTRAVENOUS | Status: AC
  Filled 2015-08-12: qty 60

## 2015-08-12 MED ORDER — LACTATED RINGERS IV SOLN
INTRAVENOUS | Status: DC | PRN
  Administered 2015-08-12: 07:00:00 via INTRAVENOUS

## 2015-08-12 MED ORDER — MAGNESIUM CITRATE PO SOLN: 1.0000 | Freq: Once | ORAL | Status: DC | PRN

## 2015-08-12 MED ORDER — ALBUMIN HUMAN 25 % IV SOLN
12.5000 g | Freq: Once | INTRAVENOUS | Status: AC
  Administered 2015-08-12: 09:00:00 via INTRAVENOUS
  Filled 2015-08-12: qty 50

## 2015-08-12 MED ORDER — BUPIVACAINE HCL (PF) 0.5 % IJ SOLN
INTRAMUSCULAR | Status: DC | PRN
  Administered 2015-08-12: 15 mg

## 2015-08-12 MED ORDER — SODIUM CHLORIDE 0.9 % IR SOLN
Status: DC | PRN
  Administered 2015-08-12: 1000 mL

## 2015-08-12 MED ORDER — METOCLOPRAMIDE HCL 5 MG/ML IJ SOLN: 5.0000 mg | Freq: Three times a day (TID) | INTRAMUSCULAR | Status: DC | PRN

## 2015-08-12 MED ORDER — KETOROLAC TROMETHAMINE 30 MG/ML IJ SOLN
INTRAMUSCULAR | Status: AC
  Filled 2015-08-12: qty 1

## 2015-08-12 MED ORDER — METOCLOPRAMIDE HCL 10 MG PO TABS: 5.0000 mg | ORAL_TABLET | Freq: Three times a day (TID) | ORAL | Status: DC | PRN

## 2015-08-12 MED ORDER — CEFAZOLIN SODIUM-DEXTROSE 2-3 GM-% IV SOLR
2.0000 g | INTRAVENOUS | Status: AC
  Administered 2015-08-12: 2 g via INTRAVENOUS

## 2015-08-12 MED ORDER — EPHEDRINE SULFATE 50 MG/ML IJ SOLN
INTRAMUSCULAR | Status: DC | PRN
  Administered 2015-08-12: 10 mg via INTRAVENOUS

## 2015-08-12 MED ORDER — STERILE WATER FOR IRRIGATION IR SOLN
Status: DC | PRN
  Administered 2015-08-12: 1000 mL

## 2015-08-12 MED ORDER — MENTHOL 3 MG MT LOZG: 1.0000 | LOZENGE | OROMUCOSAL | Status: DC | PRN

## 2015-08-12 MED ORDER — METHOCARBAMOL 500 MG PO TABS
500.0000 mg | ORAL_TABLET | Freq: Four times a day (QID) | ORAL | Status: DC | PRN
  Administered 2015-08-12: 500 mg via ORAL
  Filled 2015-08-12: qty 1

## 2015-08-12 MED ORDER — METHOCARBAMOL 1000 MG/10ML IJ SOLN
500.0000 mg | Freq: Four times a day (QID) | INTRAMUSCULAR | Status: DC | PRN
  Administered 2015-08-12: 500 mg via INTRAVENOUS
  Filled 2015-08-12 (×2): qty 5

## 2015-08-12 MED ORDER — PROPOFOL 500 MG/50ML IV EMUL
INTRAVENOUS | Status: DC | PRN
  Administered 2015-08-12: 75 ug/kg/min via INTRAVENOUS

## 2015-08-12 MED ORDER — LACTATED RINGERS IV SOLN
INTRAVENOUS | Status: DC
  Administered 2015-08-12: 13:00:00 via INTRAVENOUS

## 2015-08-12 MED ORDER — ALUM & MAG HYDROXIDE-SIMETH 200-200-20 MG/5ML PO SUSP: 30.0000 mL | ORAL | Status: DC | PRN

## 2015-08-12 MED ORDER — PROPOFOL 10 MG/ML IV BOLUS
INTRAVENOUS | Status: DC | PRN
  Administered 2015-08-12: 40 mg via INTRAVENOUS

## 2015-08-12 MED ORDER — DOCUSATE SODIUM 100 MG PO CAPS
100.0000 mg | ORAL_CAPSULE | Freq: Two times a day (BID) | ORAL | Status: DC
  Administered 2015-08-13: 100 mg via ORAL

## 2015-08-12 MED ORDER — DIPHENHYDRAMINE HCL 25 MG PO CAPS: 25.0000 mg | ORAL_CAPSULE | Freq: Four times a day (QID) | ORAL | Status: DC | PRN

## 2015-08-12 MED ORDER — POLYETHYLENE GLYCOL 3350 17 G PO PACK
17.0000 g | PACK | Freq: Two times a day (BID) | ORAL | Status: DC
  Administered 2015-08-13: 17 g via ORAL

## 2015-08-12 MED ORDER — DEXAMETHASONE SODIUM PHOSPHATE 10 MG/ML IJ SOLN: 10.0000 mg | Freq: Once | INTRAMUSCULAR | Status: DC

## 2015-08-12 SURGICAL SUPPLY — 33 items
CAPT HIP TOTAL 2 ×2 IMPLANT
CLOTH BEACON ORANGE TIMEOUT ST (SAFETY) ×2 IMPLANT
COVER PERINEAL POST (MISCELLANEOUS) ×2 IMPLANT
DRAPE STERI IOBAN 125X83 (DRAPES) ×2 IMPLANT
DRAPE U-SHAPE 47X51 STRL (DRAPES) ×4 IMPLANT
DRSG AQUACEL AG ADV 3.5X10 (GAUZE/BANDAGES/DRESSINGS) ×2 IMPLANT
DURAPREP 26ML APPLICATOR (WOUND CARE) ×2 IMPLANT
ELECT REM PT RETURN 15FT ADLT (MISCELLANEOUS) IMPLANT
ELECT REM PT RETURN 9FT ADLT (ELECTROSURGICAL) ×2
ELECTRODE REM PT RTRN 9FT ADLT (ELECTROSURGICAL) ×1 IMPLANT
GLOVE BIOGEL PI IND STRL 6.5 (GLOVE) ×1 IMPLANT
GLOVE BIOGEL PI IND STRL 7.5 (GLOVE) ×2 IMPLANT
GLOVE BIOGEL PI IND STRL 8.5 (GLOVE) ×1 IMPLANT
GLOVE BIOGEL PI INDICATOR 6.5 (GLOVE) ×1
GLOVE BIOGEL PI INDICATOR 7.5 (GLOVE) ×2
GLOVE BIOGEL PI INDICATOR 8.5 (GLOVE) ×1
GLOVE ECLIPSE 8.0 STRL XLNG CF (GLOVE) ×4 IMPLANT
GLOVE ORTHO TXT STRL SZ7.5 (GLOVE) ×2 IMPLANT
GLOVE SURG SS PI 6.5 STRL IVOR (GLOVE) ×2 IMPLANT
GLOVE SURG SS PI 7.5 STRL IVOR (GLOVE) ×2 IMPLANT
GOWN STRL REUS W/TWL LRG LVL3 (GOWN DISPOSABLE) ×2 IMPLANT
GOWN STRL REUS W/TWL XL LVL3 (GOWN DISPOSABLE) ×6 IMPLANT
HOLDER FOLEY CATH W/STRAP (MISCELLANEOUS) ×2 IMPLANT
LIQUID BAND (GAUZE/BANDAGES/DRESSINGS) ×2 IMPLANT
PACK ANTERIOR HIP CUSTOM (KITS) ×2 IMPLANT
SAW OSC TIP CART 19.5X105X1.3 (SAW) ×2 IMPLANT
SUT MNCRL AB 4-0 PS2 18 (SUTURE) ×2 IMPLANT
SUT VIC AB 1 CT1 36 (SUTURE) ×6 IMPLANT
SUT VIC AB 2-0 CT1 27 (SUTURE) ×2
SUT VIC AB 2-0 CT1 TAPERPNT 27 (SUTURE) ×2 IMPLANT
SUT VLOC 180 0 24IN GS25 (SUTURE) ×2 IMPLANT
TRAY FOLEY W/METER SILVER 16FR (SET/KITS/TRAYS/PACK) ×2 IMPLANT
YANKAUER SUCT BULB TIP 10FT TU (MISCELLANEOUS) ×2 IMPLANT

## 2015-08-12 NOTE — Anesthesia Postprocedure Evaluation (Signed)
Anesthesia Post Note  Patient: Harry Cobb  Procedure(s) Performed: Procedure(s) (LRB): LEFT TOTAL HIP ARTHROPLASTY ANTERIOR APPROACH (Left)  Patient location during evaluation: PACU Anesthesia Type: Spinal Level of consciousness: awake Pain management: pain level controlled Vital Signs Assessment: post-procedure vital signs reviewed and stable Respiratory status: spontaneous breathing Cardiovascular status: stable Postop Assessment: spinal receding Anesthetic complications: no    Last Vitals:  Filed Vitals:   08/12/15 0951 08/12/15 1015  BP: 120/79 115/81  Pulse: 44 44  Temp:  36.4 C  Resp: 17 16    Last Pain:  Filed Vitals:   08/12/15 1020  PainSc: 2                  EDWARDS,Jhayla Podgorski

## 2015-08-12 NOTE — Interval H&P Note (Signed)
History and Physical Interval Note:  08/12/2015 7:10 AM  Harry Cobb  has presented today for surgery, with the diagnosis of Left Hip Osteoarthritis  The various methods of treatment have been discussed with the patient and family. After consideration of risks, benefits and other options for treatment, the patient has consented to  Procedure(s): LEFT TOTAL HIP ARTHROPLASTY ANTERIOR APPROACH (Left) as a surgical intervention .  The patient's history has been reviewed, patient examined, no change in status, stable for surgery.  I have reviewed the patient's chart and labs.  Questions were answered to the patient's satisfaction.     Mauri Pole

## 2015-08-12 NOTE — Discharge Instructions (Signed)

## 2015-08-12 NOTE — Anesthesia Procedure Notes (Signed)
Spinal Patient location during procedure: OR Start time: 08/12/2015 7:20 AM End time: 08/12/2015 7:25 AM Reason for block: at surgeon's request Staffing Resident/CRNA: Anne Fu Performed by: resident/CRNA  Preanesthetic Checklist Completed: patient identified, site marked, surgical consent, pre-op evaluation, timeout performed, IV checked, risks and benefits discussed, monitors and equipment checked and at surgeon's request Spinal Block Patient position: sitting Prep: Betadine Approach: right paramedian Location: L3-4 Injection technique: single-shot Needle Needle type: [place]  Needle gauge: 25 G Needle length: 9 cm Assessment Sensory level: T6 Additional Notes Expiration date of kit checked and confirmed. Patient tolerated procedure well, without complications. X 1 attempt with noted clear CSF return. Loss of motor and sensory on exam post injection.

## 2015-08-12 NOTE — Evaluation (Signed)
Physical Therapy Evaluation Patient Details Name: Harry Cobb MRN: [id number] DOB: 09-21-1960 Today's Date: 08/12/2015   History of Present Illness  L THR; Pt s/p Bil TKR  Clinical Impression  Pt s/p L THR presents with decreased L LE strength/ROM and post op pain limiting functional mobility.  Pt should progress well to dc home with family assist.    Follow Up Recommendations Home health PT    Equipment Recommendations  None recommended by PT    Recommendations for Other Services OT consult     Precautions / Restrictions Precautions Precautions: Knee;Fall Restrictions Weight Bearing Restrictions: No Other Position/Activity Restrictions: WBAT      Mobility  Bed Mobility Overal bed mobility: Needs Assistance Bed Mobility: Supine to Sit;Sit to Supine     Supine to sit: Min assist Sit to supine: Min assist   General bed mobility comments: Cues for sequence and use of R LE to self assist  Transfers Overall transfer level: Needs assistance Equipment used: Rolling walker (2 wheeled) Transfers: Sit to/from Stand Sit to Stand: Min guard         General transfer comment: cues for sequence and use of R LE to self assist  Ambulation/Gait Ambulation/Gait assistance: Min assist;Min guard Ambulation Distance (Feet): 100 Feet Assistive device: Rolling walker (2 wheeled) Gait Pattern/deviations: Step-to pattern;Step-through pattern;Decreased step length - right;Decreased step length - left;Shuffle Gait velocity: decr   General Gait Details: cues for posture, position from RW and initial sequence  Stairs            Wheelchair Mobility    Modified Rankin (Stroke Patients Only)       Balance                                             Pertinent Vitals/Pain Pain Assessment: 0-10 Pain Score: 6  Pain Location: L hip Pain Descriptors / Indicators: Aching;Sore Pain Intervention(s): Limited activity within patient's tolerance;Monitored  during session;Premedicated before session;Ice applied    Home Living Family/patient expects to be discharged to:: Private residence Living Arrangements: Spouse/significant other Available Help at Discharge: Family Type of Home: House Home Access: Stairs to enter Entrance Stairs-Rails: None Technical brewer of Steps: 2 Home Layout: Two level Home Equipment: Crutches      Prior Function Level of Independence: Independent               Hand Dominance        Extremity/Trunk Assessment   Upper Extremity Assessment: Overall WFL for tasks assessed           Lower Extremity Assessment: LLE deficits/detail      Cervical / Trunk Assessment: Normal  Communication   Communication: No difficulties  Cognition Arousal/Alertness: Awake/alert Behavior During Therapy: WFL for tasks assessed/performed Overall Cognitive Status: Within Functional Limits for tasks assessed                      General Comments      Exercises        Assessment/Plan    PT Assessment Patient needs continued PT services  PT Diagnosis Difficulty walking   PT Problem List Decreased strength;Decreased range of motion;Decreased activity tolerance;Decreased mobility;Decreased knowledge of use of DME;Pain  PT Treatment Interventions DME instruction;Gait training;Stair training;Functional mobility training;Therapeutic activities;Therapeutic exercise;Patient/family education   PT Goals (Current goals can be found in the Care Plan section) Acute Rehab  PT Goals Patient Stated Goal: Resume previous lifestyle with decreased pain PT Goal Formulation: With patient Time For Goal Achievement: 08/15/15 Potential to Achieve Goals: Good    Frequency 7X/week   Barriers to discharge        Co-evaluation               End of Session Equipment Utilized During Treatment: Gait belt Activity Tolerance: Patient tolerated treatment well Patient left: in bed;with call bell/phone within  reach;with bed alarm set Nurse Communication: Mobility status         Time: [id number] PT Time Calculation (min) (ACUTE ONLY): 23 min   Charges:   PT Evaluation $PT Eval Low Complexity: 1 Procedure PT Treatments $Gait Training: 8-22 mins   PT G Codes:        Rifka Ramey Sep 07, 2015, 5:57 PM

## 2015-08-12 NOTE — Progress Notes (Signed)
Portable AP and Lateral Left Hip X-rays done. 

## 2015-08-12 NOTE — Progress Notes (Signed)
Utilization review completed.  

## 2015-08-12 NOTE — Op Note (Addendum)
NAME:  Harry Cobb NO.: [id number]      MEDICAL RECORD NO.: [id number]      FACILITY:  [facility]      PHYSICIAN:  Paralee Cancel D  DATE OF BIRTH:  1961/05/06     DATE OF PROCEDURE:  08/12/2015                                 OPERATIVE REPORT         PREOPERATIVE DIAGNOSIS: Left  hip osteoarthritis.      POSTOPERATIVE DIAGNOSIS:  Left hip osteoarthritis.      PROCEDURE:  Left total hip replacement through an anterior approach   utilizing DePuy THR system, component size 63mm pinnacle cup, a size 36+4 neutral   Altrex liner, a size 2 Hi Tri Lock stem with a 36+1.5 delta ceramic   ball.      SURGEON:  Pietro Cassis. Alvan Dame, M.D.      ASSISTANT:  Danae Orleans, PA-C     ANESTHESIA:  Spinal.      SPECIMENS:  None.      COMPLICATIONS:  None.      BLOOD LOSS:  1200 cc     DRAINS:  None.      INDICATION OF THE PROCEDURE:  AAYANSH HANCOX is a 55 y.o. male who had   presented to office for evaluation of left hip pain.  Radiographs revealed   progressive degenerative changes with bone-on-bone   articulation to the  hip joint.  The patient had painful limited range of   motion significantly affecting their overall quality of life.  The patient was failing to    respond to conservative measures, and at this point was ready   to proceed with more definitive measures.  The patient has noted progressive   degenerative changes in his hip, progressive problems and dysfunction   with regarding the hip prior to surgery.  Consent was obtained for   benefit of pain relief.  Specific risk of infection, DVT, component   failure, dislocation, need for revision surgery, as well discussion of   the anterior versus posterior approach were reviewed.  Consent was   obtained for benefit of anterior pain relief through an anterior   approach.      PROCEDURE IN DETAIL:  The patient was brought to operative theater.   Once adequate anesthesia, preoperative  antibiotics, 2gm of Ancef, 1 gm of Tranexamic Acid, and 10 mg of Decadron administered.   The patient was positioned supine on the OSI Hanna table.  Once adequate   padding of boney process was carried out, we had predraped out the hip, and  used fluoroscopy to confirm orientation of the pelvis and position.      The left hip was then prepped and draped from proximal iliac crest to   mid thigh with shower curtain technique.      Time-out was performed identifying the patient, planned procedure, and   extremity.     An incision was then made 2 cm distal and lateral to the   anterior superior iliac spine extending over the orientation of the   tensor fascia lata muscle and sharp dissection was carried down to the   fascia of the muscle and protractor placed in the soft tissues.      The fascia was  then incised.  The muscle belly was identified and swept   laterally and retractor placed along the superior neck.  Following   cauterization of the circumflex vessels and removing some pericapsular   fat, a second cobra retractor was placed on the inferior neck.  A third   retractor was placed on the anterior acetabulum after elevating the   anterior rectus.  A L-capsulotomy was along the line of the   superior neck to the trochanteric fossa, then extended proximally and   distally.  Tag sutures were placed and the retractors were then placed   intracapsular.  We then identified the trochanteric fossa and   orientation of my neck cut, confirmed this radiographically   and then made a neck osteotomy with the femur on traction.  The femoral   head was removed without difficulty or complication.  Traction was let   off and retractors were placed posterior and anterior around the   acetabulum.      The labrum and foveal tissue were debrided.  I began reaming with a 92mm   reamer and reamed up to 87mm reamer with good bony bed preparation and a 79mm   cup was chosen.  The final 32mm Pinnacle cup  was then impacted under fluoroscopy  to confirm the depth of penetration and orientation with respect to   abduction.  A screw was placed followed by the hole eliminator.  The final   36+4 neutral Altrex liner was impacted with good visualized rim fit.  The cup was positioned anatomically within the acetabular portion of the pelvis.      At this point, the femur was rolled at 80 degrees.  Further capsule was   released off the inferior aspect of the femoral neck.  I then   released the superior capsule proximally.  The hook was placed laterally   along the femur and elevated manually and held in position with the bed   hook.  The leg was then extended and adducted with the leg rolled to 100   degrees of external rotation.  Once the proximal femur was fully   exposed, I used a box osteotome to set orientation.  I then began   broaching with the starting chili pepper broach and passed this by hand and then broached up to 2.  With the 2 broach in place I chose a high offset neck and did trial reductions.  The offset was appropriate, leg lengths   appeared to be best matched using the +1.5 head ball, confirmed radiographically.   Given these findings, I went ahead and dislocated the hip, repositioned all   retractors and positioned the right hip in the extended and abducted position.  The final 2 Hi Tri Lock stem was   chosen and it was impacted down to the level of neck cut.  Based on this   and the trial reduction, a 36+1.5 delta ceramic ball was chosen and   impacted onto a clean and dry trunnion, and the hip was reduced.  The   hip had been irrigated throughout the case again at this point.  I did   reapproximate the superior capsular leaflet to the anterior leaflet   using #1 Vicryl.  The fascia of the   tensor fascia lata muscle was then reapproximated using #1 Vicryl and #0 V-lock sutures.  The   remaining wound was closed with 2-0 Vicryl and running 4-0 Monocryl.   The hip was cleaned,  dried, and dressed sterilely  using Dermabond and   Aquacel dressing.  He was then brought   to recovery room in stable condition tolerating the procedure well.    Danae Orleans, PA-C was present for the entirety of the case involved from   preoperative positioning, perioperative retractor management, general   facilitation of the case, as well as primary wound closure as assistant.            Pietro Cassis Alvan Dame, M.D.        08/12/2015 8:55 AM

## 2015-08-12 NOTE — Transfer of Care (Signed)
Immediate Anesthesia Transfer of Care Note  Patient: Harry Cobb  Procedure(s) Performed: Procedure(s): LEFT TOTAL HIP ARTHROPLASTY ANTERIOR APPROACH (Left)  Patient Location: PACU  Anesthesia Type:Spinal  Level of Consciousness:  sedated, patient cooperative and responds to stimulation  Airway & Oxygen Therapy:Patient Spontanous Breathing and Patient connected to face mask oxgen  Post-op Assessment:  Report given to PACU RN and Post -op Vital signs reviewed and stable  Post vital signs:  Reviewed and stable  Last Vitals:  Filed Vitals:   08/12/15 0535 08/12/15 0916  BP: 120/83   Pulse: 50   Temp: 36.8 C 36.3 C  Resp: 16     Complications: No apparent anesthesia complications, [id number] level on exam, denied pain on assessment.

## 2015-08-12 NOTE — Anesthesia Preprocedure Evaluation (Addendum)
Anesthesia Evaluation  Patient identified by MRN, date of birth, ID band Patient awake    Reviewed: Allergy & Precautions, NPO status , Patient's Chart, lab work & pertinent test results  Airway Mallampati: II  TM Distance: >3 FB Neck ROM: Full    Dental   Pulmonary neg pulmonary ROS,    breath sounds clear to auscultation       Cardiovascular negative cardio ROS   Rhythm:Regular Rate:Normal     Neuro/Psych    GI/Hepatic negative GI ROS, Neg liver ROS,   Endo/Other  negative endocrine ROS  Renal/GU negative Renal ROS     Musculoskeletal  (+) Arthritis ,   Abdominal   Peds  Hematology   Anesthesia Other Findings   Reproductive/Obstetrics                            Anesthesia Physical Anesthesia Plan  ASA: II  Anesthesia Plan: Spinal   Post-op Pain Management:    Induction: Intravenous  Airway Management Planned: Simple Face Mask  Additional Equipment:   Intra-op Plan:   Post-operative Plan: Extubation in OR  Informed Consent: I have reviewed the patients History and Physical, chart, labs and discussed the procedure including the risks, benefits and alternatives for the proposed anesthesia with the patient or authorized representative who has indicated his/her understanding and acceptance.   Dental advisory given  Plan Discussed with: CRNA and Anesthesiologist  Anesthesia Plan Comments:         Anesthesia Quick Evaluation

## 2015-08-12 NOTE — Progress Notes (Signed)
X-ray results noted 

## 2015-08-12 NOTE — Progress Notes (Signed)
Dr. Alvan Dame  In and talked with patient

## 2015-08-13 LAB — BASIC METABOLIC PANEL
ANION GAP: 10 (ref 5–15)
BUN: 19 mg/dL (ref 6–20)
CO2: 24 mmol/L (ref 22–32)
Calcium: 8.5 mg/dL — ABNORMAL LOW (ref 8.9–10.3)
Chloride: 104 mmol/L (ref 101–111)
Creatinine, Ser: 0.97 mg/dL (ref 0.61–1.24)
GFR calc Af Amer: >60 mL/min (ref >60)
GLUCOSE: 162 mg/dL — AB (ref 65–99)
POTASSIUM: 4 mmol/L (ref 3.5–5.1)
SODIUM: 138 mmol/L (ref 135–145)

## 2015-08-13 LAB — CBC
HCT: 32.7 % — ABNORMAL LOW (ref 39.0–52.0)
HEMOGLOBIN: 11.4 g/dL — AB (ref 13.0–17.0)
MCH: 32.3 pg (ref 26.0–34.0)
MCHC: 34.9 g/dL (ref 30.0–36.0)
MCV: 92.6 fL (ref 78.0–100.0)
PLATELETS: 138 10*3/uL — AB (ref 150–400)
RBC: 3.53 MIL/uL — AB (ref 4.22–5.81)
RDW: 12.8 % (ref 11.5–15.5)
WBC: 7.8 10*3/uL (ref 4.0–10.5)

## 2015-08-13 MED ORDER — POLYETHYLENE GLYCOL 3350 17 G PO PACK: 17.0000 g | PACK | Freq: Two times a day (BID) | ORAL | Status: DC

## 2015-08-13 MED ORDER — METHOCARBAMOL 500 MG PO TABS: 500.0000 mg | ORAL_TABLET | Freq: Four times a day (QID) | ORAL | Status: DC | PRN

## 2015-08-13 MED ORDER — ASPIRIN 325 MG PO TBEC: 325.0000 mg | DELAYED_RELEASE_TABLET | Freq: Two times a day (BID) | ORAL | Status: AC

## 2015-08-13 MED ORDER — FERROUS SULFATE 325 (65 FE) MG PO TABS: 325.0000 mg | ORAL_TABLET | Freq: Three times a day (TID) | ORAL | Status: DC

## 2015-08-13 MED ORDER — DOCUSATE SODIUM 100 MG PO CAPS: 100.0000 mg | ORAL_CAPSULE | Freq: Two times a day (BID) | ORAL | Status: DC

## 2015-08-13 MED ORDER — HYDROCODONE-ACETAMINOPHEN 7.5-325 MG PO TABS: 1.0000 | ORAL_TABLET | ORAL | Status: DC | PRN

## 2015-08-13 NOTE — Progress Notes (Signed)
Physical Therapy Treatment Patient Details Name: Harry Cobb MRN: [id number] DOB: 1961/01/18 Today's Date: 08/13/2015    History of Present Illness L THR; Pt s/p Bil TKR    PT Comments    POD # 2 Assisted with amb with RW and then B crutches.  Pt preferred the crutches.  Practiced the stairs with spouse present.  Then returned to room and performed all THR TE's following HEP handout.  Instructed on proper tech and freq as well as use of ICE after.  Pt ready for D/C to home.  Follow Up Recommendations  Home health PT     Equipment Recommendations  None recommended by PT    Recommendations for Other Services       Precautions / Restrictions Precautions Precautions: Fall Restrictions Weight Bearing Restrictions: No Other Position/Activity Restrictions: WBAT    Mobility  Bed Mobility Overal bed mobility: Modified Independent             General bed mobility comments: increased time only needed  Transfers Overall transfer level: Needs assistance Equipment used: Rolling walker (2 wheeled) Transfers: Sit to/from Stand Sit to Stand: Modified independent (Device/Increase time)         General transfer comment: <25% VC's on safety with turns  Ambulation/Gait Ambulation/Gait assistance: Modified independent (Device/Increase time);Supervision Ambulation Distance (Feet): 275 Feet Assistive device: Rolling walker (2 wheeled) Gait Pattern/deviations: Step-to pattern;Step-through pattern Gait velocity: decr   General Gait Details: cues for posture, position from RW and initial sequence.  amb with walker and crutches.  Pt preferred using B crutches.   Performed well and safely    Stairs            Wheelchair Mobility    Modified Rankin (Stroke Patients Only)       Balance                                    Cognition Arousal/Alertness: Awake/alert Behavior During Therapy: WFL for tasks assessed/performed Overall Cognitive Status: Within  Functional Limits for tasks assessed                      Exercises   Total Hip Replacement TE's 10 reps ankle pumps 10 reps knee presses 10 reps heel slides 10 reps SAQ's 10 reps ABD Followed by ICE    General Comments        Pertinent Vitals/Pain Pain Assessment: 0-10 Pain Score: 2  Pain Location: L hip Pain Descriptors / Indicators: Aching;Sore Pain Intervention(s): Monitored during session;Premedicated before session;Repositioned;Ice applied    Home Living                      Prior Function            PT Goals (current goals can now be found in the care plan section) Progress towards PT goals: Progressing toward goals    Frequency  7X/week    PT Plan      Co-evaluation             End of Session Equipment Utilized During Treatment: Gait belt Activity Tolerance: Patient tolerated treatment well Patient left: in bed;with call bell/phone within reach;with bed alarm set     Time: [id number] PT Time Calculation (min) (ACUTE ONLY): 45 min  Charges:  $Gait Training: 8-22 mins $Therapeutic Exercise: 8-22 mins $Therapeutic Activity: 8-22 mins  G Codes:      Rica Koyanagi  PTA [facility]  Acute  Rehab Pager      [phone]

## 2015-08-13 NOTE — Care Management Note (Signed)
Case Management Note  Patient Details  Name: Harry Cobb MRN: [id number] Date of Birth: 1961/06/17  Subjective/Objective:                  LEFT TOTAL HIP ARTHROPLASTY ANTERIOR APPROACH (Left)  Action/Plan: Discharge planning Expected Discharge Date:  08/13/15               Expected Discharge Plan:  Home/Self Care  In-House Referral:     Discharge planning Services  CM Consult  Post Acute Care Choice:    Choice offered to:     DME Arranged:  Kasandra Knudsen DME Agency:  [place] Arranged:  Patient Refused [facility] Agency:     Status of Service:  Completed, signed off  Medicare Important Message Given:    Date Medicare IM Given:    Medicare IM give by:    Date Additional Medicare IM Given:    Additional Medicare Important Message give by:     If discussed at [place] of Stay Meetings, dates discussed:    Additional Comments: CM met with pt in room to offer choice and pt refuses all HHC.  Pt has requested a can and has crutches.  No other CM needs were communicated. Dellie Catholic, RN 08/13/2015, 12:42 PM

## 2015-08-13 NOTE — Progress Notes (Signed)
     Subjective: 1 Day Post-Op Procedure(s) (LRB): LEFT TOTAL HIP ARTHROPLASTY ANTERIOR APPROACH (Left)   Patient reports pain as mild, pain controlled. No events throughout the night. H&H remained stable even with blood loss during the case.  Ready to be discharged home.  Objective:   VITALS:   Filed Vitals:   08/13/15 0207 08/13/15 0529  BP: 109/66 114/73  Pulse: 67 63  Temp: 98.9 F (37.2 C) 98.3 F (36.8 C)  Resp: 16 16    Dorsiflexion/Plantar flexion intact Incision: dressing C/D/I No cellulitis present Compartment soft  LABS  Recent Labs  08/13/15 0430  HGB 11.4*  HCT 32.7*  WBC 7.8  PLT 138*     Recent Labs  08/13/15 0430  NA 138  K 4.0  BUN 19  CREATININE 0.97  GLUCOSE 162*     Assessment/Plan: 1 Day Post-Op Procedure(s) (LRB): LEFT TOTAL HIP ARTHROPLASTY ANTERIOR APPROACH (Left) Foley cath d/c'ed Advance diet Up with therapy D/C IV fluids Discharge home with home health  Follow up in 2 weeks at [facility]. Follow up with OLIN,Janayia Burggraf D in 2 weeks.  Contact information:  [facility] [address] [place] [id number]    Overweight (BMI 25-29.9) Estimated body mass index is 28.47 kg/(m^2) as calculated from the following:   Height as of this encounter: 6' (1.829 m).   Weight as of this encounter: 95.255 kg (210 lb). Patient also counseled that weight may inhibit the healing process Patient counseled that losing weight will help with future health issues        [place] Pugh. Damonte Frieson   PAC  08/13/2015, 9:16 AM

## 2015-08-14 NOTE — Discharge Summary (Signed)
Physician Discharge Summary  Patient ID: Harry Cobb MRN: [id number] DOB/AGE: August 27, 1960 55 y.o.  Admit date: 08/12/2015 Discharge date: 08/13/2015   Procedures:  Procedure(s) (LRB): LEFT TOTAL HIP ARTHROPLASTY ANTERIOR APPROACH (Left)  Attending Physician:  Dr. Paralee Cancel   Admission Diagnoses:   Left hip primary OA / pain  Discharge Diagnoses:  Principal Problem:   S/P left THA, AA Active Problems:   Overweight (BMI 25.0-29.9)  Past Medical History  Diagnosis Date  . No pertinent past medical history   . Arthritis     HPI:    Harry Cobb, 55 y.o. male, has a history of pain and functional disability in the left hip(s) due to arthritis and patient has failed non-surgical conservative treatments for greater than 12 weeks to include NSAID's and/or analgesics, corticosteriod injections and activity modification. Onset of symptoms was abrupt starting 2-3 years ago with rapidlly worsening course since that time.The patient noted no past surgery on the left hip(s). Patient currently rates pain in the left hip at 10 out of 10 with activity. Patient has night pain, worsening of pain with activity and weight bearing, trendelenberg gait, pain that interfers with activities of daily living and pain with passive range of motion. Patient has evidence of periarticular osteophytes and joint space narrowing by imaging studies. This condition presents safety issues increasing the risk of falls. There is no current active infection. Risks, benefits and expectations were discussed with the patient. Risks including but not limited to the risk of anesthesia, blood clots, nerve damage, blood vessel damage, failure of the prosthesis, infection and up to and including death. Patient understand the risks, benefits and expectations and wishes to proceed with surgery.   PCP: Antony Blackbird, MD   Discharged Condition: good  Hospital Course:  Patient underwent the above stated procedure on  08/12/2015. Patient tolerated the procedure well and brought to the recovery room in good condition and subsequently to the floor.  POD #1 BP: 114/73 ; Pulse: 63 ; Temp: 98.3 F (36.8 C) ; Resp: 16 Patient reports pain as mild, pain controlled. No events throughout the night. H&H remained stable even with blood loss during the case. Ready to be discharged home. Dorsiflexion/plantar flexion intact, incision: dressing C/D/I, no cellulitis present and compartment soft.   LABS  Basename    HGB     11.4  HCT     32.7     Discharge Exam: General appearance: alert, cooperative and no distress Extremities: Homans sign is negative, no sign of DVT, no edema, redness or tenderness in the calves or thighs and no ulcers, gangrene or trophic changes  Disposition: Home with follow up in 2 weeks   Follow-up Information    Follow up with Harry Pole, MD. Schedule an appointment as soon as possible for a visit in 2 weeks.   Specialty:  Orthopedic Surgery   Contact information:   [address] [place] [address] [id number]       Discharge Instructions    Call MD / Call 911    Complete by:  As directed   If you experience chest pain or shortness of breath, CALL 911 and be transported to the hospital emergency room.  If you develope a fever above 101 F, pus (white drainage) or increased drainage or redness at the wound, or calf pain, call your surgeon's office.     Change dressing    Complete by:  As directed   Maintain surgical dressing until follow up  in the clinic. If the edges start to pull up, may reinforce with tape. If the dressing is no longer working, may remove and cover with gauze and tape, but must keep the area dry and clean.  Call with any questions or concerns.     Constipation Prevention    Complete by:  As directed   Drink plenty of fluids.  Prune juice may be helpful.  You may use a stool softener, such as Colace (over the counter) 100 mg twice a day.   Use MiraLax (over the counter) for constipation as needed.     Diet - low sodium heart healthy    Complete by:  As directed      Discharge instructions    Complete by:  As directed   Maintain surgical dressing until follow up in the clinic. If the edges start to pull up, may reinforce with tape. If the dressing is no longer working, may remove and cover with gauze and tape, but must keep the area dry and clean.  Follow up in 2 weeks at [facility]. Call with any questions or concerns.     Increase activity slowly as tolerated    Complete by:  As directed   Weight bearing as tolerated with assist device (walker, cane, etc) as directed, use it as long as suggested by your surgeon or therapist, typically at least 4-6 weeks.     TED hose    Complete by:  As directed   Use stockings (TED hose) for 2 weeks on both leg(s).  You may remove them at night for sleeping.             Medication List    STOP taking these medications        celecoxib 200 MG capsule  Commonly known as:  CELEBREX     HUMIRA PEN 40 MG/0.8ML Pnkt  Generic drug:  Adalimumab     naproxen 500 MG tablet  Commonly known as:  NAPROSYN     tiZANidine 4 MG capsule  Commonly known as:  ZANAFLEX      TAKE these medications        aspirin 325 MG EC tablet  Take 1 tablet (325 mg total) by mouth 2 (two) times daily.     docusate sodium 100 MG capsule  Commonly known as:  COLACE  Take 1 capsule (100 mg total) by mouth 2 (two) times daily.     ferrous sulfate 325 (65 FE) MG tablet  Take 1 tablet (325 mg total) by mouth 3 (three) times daily after meals.     gabapentin 300 MG capsule  Commonly known as:  NEURONTIN  Take 300 mg by mouth 2 (two) times daily.     HYDROcodone-acetaminophen 7.5-325 MG tablet  Commonly known as:  NORCO  Take 1-2 tablets by mouth every 4 (four) hours as needed for moderate pain.     methocarbamol 500 MG tablet  Commonly known as:  ROBAXIN  Take 1 tablet (500 mg total) by  mouth every 6 (six) hours as needed for muscle spasms.     multivitamin with minerals Tabs tablet  Take 1 tablet by mouth daily.     polyethylene glycol packet  Commonly known as:  MIRALAX / GLYCOLAX  Take 17 g by mouth 2 (two) times daily.         Signed: [place]. Harry Cobb [place]   PA-C  08/14/2015, 11:00 PM

## 2015-08-14 NOTE — Progress Notes (Signed)
Occupational Therapy Evaluation Patient Details Name: Harry Cobb MRN: [id number] DOB: 09-29-1960 Today's Date: 08/14/2015    History of Present Illness L THR; Pt s/p Bil TKR   Clinical Impression   All education completed and patient to d/c home with wife's assistance. OT will sign off.    Follow Up Recommendations  No OT follow up;Supervision/Assistance - 24 hour    Equipment Recommendations  None recommended by OT    Recommendations for Other Services       Precautions / Restrictions Precautions Precautions: Knee;Fall Restrictions Weight Bearing Restrictions: No Other Position/Activity Restrictions: WBAT      Mobility Bed Mobility                  Transfers                      Balance                                            ADL Overall ADL's : Needs assistance/impaired Eating/Feeding: Independent   Grooming: Set up   Upper Body Bathing: Set up   Lower Body Bathing: Minimal assistance   Upper Body Dressing : Set up   Lower Body Dressing: Minimal assistance   Toilet Transfer: Supervision/safety;Ambulation   Toileting- Clothing Manipulation and Hygiene: Minimal assistance         General ADL Comments: Wife to assist with LB self-care. Educated on walk-in shower and toilet transfers with crutches. Patient verbalized understanding.     Vision     Perception     Praxis      Pertinent Vitals/Pain       Hand Dominance Right   Extremity/Trunk Assessment             Communication Communication Communication: No difficulties   Cognition Arousal/Alertness: Awake/alert Behavior During Therapy: WFL for tasks assessed/performed Overall Cognitive Status: Within Functional Limits for tasks assessed                     General Comments       Exercises       Shoulder Instructions      Home Living Family/patient expects to be discharged to:: Private residence Living Arrangements:  Spouse/significant other Available Help at Discharge: Family Type of Home: House Home Access: Stairs to enter Technical brewer of Steps: 2 Entrance Stairs-Rails: None Home Layout: Two level Alternate Level Stairs-Number of Steps: 14 Alternate Level Stairs-Rails: Right Bathroom Shower/Tub: Occupational psychologist: Standard     Home Equipment: Crutches          Prior Functioning/Environment Level of Independence: Independent             OT Diagnosis: Acute pain   OT Problem List: Decreased strength;Decreased activity tolerance;Pain;Decreased knowledge of use of DME or AE   OT Treatment/Interventions:      OT Goals(Current goals can be found in the care plan section) Acute Rehab OT Goals Patient Stated Goal: Resume previous lifestyle with decreased pain OT Goal Formulation: All assessment and education complete, DC therapy  OT Frequency:     Barriers to D/C:            Co-evaluation              End of Session    Activity Tolerance: Patient tolerated treatment well Patient left: in  bed;with call bell/phone within reach;with family/visitor present   Time: 1200-1212 OT Time Calculation (min): 12 min Charges:  OT General Charges $OT Visit: 1 Procedure OT Evaluation $OT Eval Low Complexity: 1 Procedure G-Codes:    Aveyah Greenwood A 09-09-2015, 8:02 AM

## 2016-07-21 ENCOUNTER — Encounter (HOSPITAL_COMMUNITY): Payer: Self-pay | Admitting: *Deleted

## 2016-07-21 NOTE — Progress Notes (Signed)
Scheduling pre op- please PLACE SURGICAL ORDERS IN EPIC  Thanks 

## 2016-07-26 NOTE — Patient Instructions (Addendum)
AYVAN STOCKHAUSEN  07/26/2016   Your procedure is scheduled on: Tuesday 08/03/2016  Report to [facility]  Entrance take [place]  elevators to 3rd floor to  [place] at  [phone] AM.  Call this number if you have problems the morning of surgery [phone]   Remember: ONLY 1 PERSON MAY GO WITH YOU TO SHORT STAY TO GET  READY MORNING OF [place].   Do not eat food or drink liquids :After Midnight.     Take these medicines the morning of surgery with A SIP OF WATER: use Atrovent nasal spray, Astelin nasal spray, Allegra                                 You may not have any metal on your body including hair pins and              piercings  Do not wear jewelry, make-up, lotions, powders or perfumes, deodorant             Do not wear nail polish.  Do not shave  48 hours prior to surgery.              Men may shave face and neck.   Do not bring valuables to the hospital. [place].  Contacts, dentures or bridgework may not be worn into surgery.  Leave suitcase in the car. After surgery it may be brought to your room.                  Please read over the following fact sheets you were given: _____________________________________________________________________             [facility] - Preparing for Surgery Before surgery, you can play an important role.  Because skin is not sterile, your skin needs to be as free of germs as possible.  You can reduce the number of germs on your skin by washing with CHG (chlorahexidine gluconate) soap before surgery.  CHG is an antiseptic cleaner which kills germs and bonds with the skin to continue killing germs even after washing. Please DO NOT use if you have an allergy to CHG or antibacterial soaps.  If your skin becomes reddened/irritated stop using the CHG and inform your nurse when you arrive at Short Stay. Do not shave (including legs and underarms) for at least 48  hours prior to the first CHG shower.  You may shave your face/neck. Please follow these instructions carefully:  1.  Shower with CHG Soap the night before surgery and the  morning of Surgery.  2.  If you choose to wash your hair, wash your hair first as usual with your  normal  shampoo.  3.  After you shampoo, rinse your hair and body thoroughly to remove the  shampoo.                           4.  Use CHG as you would any other liquid soap.  You can apply chg directly  to the skin and wash                       Gently with a  scrungie or clean washcloth.  5.  Apply the CHG Soap to your body ONLY FROM THE NECK DOWN.   Do not use on face/ open                           Wound or open sores. Avoid contact with eyes, ears mouth and genitals (private parts).                       Wash face,  Genitals (private parts) with your normal soap.             6.  Wash thoroughly, paying special attention to the area where your surgery  will be performed.  7.  Thoroughly rinse your body with warm water from the neck down.  8.  DO NOT shower/wash with your normal soap after using and rinsing off  the CHG Soap.                9.  Pat yourself dry with a clean towel.            10.  Wear clean pajamas.            11.  Place clean sheets on your bed the night of your first shower and do not  sleep with pets. Day of Surgery : Do not apply any lotions/deodorants the morning of surgery.  Please wear clean clothes to the hospital/surgery center.  FAILURE TO FOLLOW THESE INSTRUCTIONS MAY RESULT IN THE CANCELLATION OF YOUR SURGERY PATIENT SIGNATURE_________________________________  NURSE SIGNATURE__________________________________  ________________________________________________________________________   Adam [place]ix  An incentive spirometer is a tool that can help keep your lungs clear and active. This tool measures how well you are filling your lungs with each breath. Taking long deep breaths may help  reverse or decrease the chance of developing breathing (pulmonary) problems (especially infection) following:  A long period of time when you are unable to move or be active. BEFORE THE PROCEDURE   If the spirometer includes an indicator to show your best effort, your nurse or respiratory therapist will set it to a desired goal.  If possible, sit up straight or lean slightly forward. Try not to slouch.  Hold the incentive spirometer in an upright position. INSTRUCTIONS FOR USE  1. Sit on the edge of your bed if possible, or sit up as far as you can in bed or on a chair. 2. Hold the incentive spirometer in an upright position. 3. Breathe out normally. 4. Place the mouthpiece in your mouth and seal your lips tightly around it. 5. Breathe in slowly and as deeply as possible, raising the piston or the ball toward the top of the column. 6. Hold your breath for 3-5 seconds or for as long as possible. Allow the piston or ball to fall to the bottom of the column. 7. Remove the mouthpiece from your mouth and breathe out normally. 8. Rest for a few seconds and repeat Steps 1 through 7 at least 10 times every 1-2 hours when you are awake. Take your time and take a few normal breaths between deep breaths. 9. The spirometer may include an indicator to show your best effort. Use the indicator as a goal to work toward during each repetition. 10. After each set of 10 deep breaths, practice coughing to be sure your lungs are clear. If you have an incision (the cut made at the time of surgery), support your incision  when coughing by placing a pillow or rolled up towels firmly against it. Once you are able to get out of bed, walk around indoors and cough well. You may stop using the incentive spirometer when instructed by your caregiver.  RISKS AND COMPLICATIONS  Take your time so you do not get dizzy or light-headed.  If you are in pain, you may need to take or ask for pain medication before doing incentive  spirometry. It is harder to take a deep breath if you are having pain. AFTER USE  Rest and breathe slowly and easily.  It can be helpful to keep track of a log of your progress. Your caregiver can provide you with a simple table to help with this. If you are using the spirometer at home, follow these instructions: [place] IF:   You are having difficultly using the spirometer.  You have trouble using the spirometer as often as instructed.  Your pain medication is not giving enough relief while using the spirometer.  You develop fever of 100.5 F (38.1 C) or higher. SEEK IMMEDIATE MEDICAL CARE IF:   You cough up bloody sputum that had not been present before.  You develop fever of 102 F (38.9 C) or greater.  You develop worsening pain at or near the incision site. MAKE SURE YOU:   Understand these instructions.  Will watch your condition.  Will get help right away if you are not doing well or get worse. Document Released: 10/18/2006 Document Revised: 08/30/2011 Document Reviewed: 12/19/2006 ExitCare Patient Information 2014 ExitCare, [place].   ________________________________________________________________________  WHAT IS A BLOOD TRANSFUSION? Blood Transfusion Information  A transfusion is the replacement of blood or some of its parts. Blood is made up of multiple cells which provide different functions.  Red blood cells carry oxygen and are used for blood loss replacement.  White blood cells fight against infection.  Platelets control bleeding.  Plasma helps clot blood.  Other blood products are available for specialized needs, such as hemophilia or other clotting disorders. BEFORE THE TRANSFUSION  Who gives blood for transfusions?   Healthy volunteers who are fully evaluated to make sure their blood is safe. This is blood bank blood. Transfusion therapy is the safest it has ever been in the practice of medicine. Before blood is taken from a donor, a  complete history is taken to make sure that person has no history of diseases nor engages in risky social behavior (examples are intravenous drug use or sexual activity with multiple partners). The donor's travel history is screened to minimize risk of transmitting infections, such as malaria. The donated blood is tested for signs of infectious diseases, such as HIV and hepatitis. The blood is then tested to be sure it is compatible with you in order to minimize the chance of a transfusion reaction. If you or a relative donates blood, this is often done in anticipation of surgery and is not appropriate for emergency situations. It takes many days to process the donated blood. RISKS AND COMPLICATIONS Although transfusion therapy is very safe and saves many lives, the main dangers of transfusion include:   Getting an infectious disease.  Developing a transfusion reaction. This is an allergic reaction to something in the blood you were given. Every precaution is taken to prevent this. The decision to have a blood transfusion has been considered carefully by your caregiver before blood is given. Blood is not given unless the benefits outweigh the risks. AFTER THE TRANSFUSION  Right after  receiving a blood transfusion, you will usually feel much better and more energetic. This is especially true if your red blood cells have gotten low (anemic). The transfusion raises the level of the red blood cells which carry oxygen, and this usually causes an energy increase.  The nurse administering the transfusion will monitor you carefully for complications. HOME CARE INSTRUCTIONS  No special instructions are needed after a transfusion. You may find your energy is better. Speak with your caregiver about any limitations on activity for underlying diseases you may have. SEEK MEDICAL CARE IF:   Your condition is not improving after your transfusion.  You develop redness or irritation at the intravenous (IV)  site. SEEK IMMEDIATE MEDICAL CARE IF:  Any of the following symptoms occur over the next 12 hours:  Shaking chills.  You have a temperature by mouth above 102 F (38.9 C), not controlled by medicine.  Chest, back, or muscle pain.  People around you feel you are not acting correctly or are confused.  Shortness of breath or difficulty breathing.  Dizziness and fainting.  You get a rash or develop hives.  You have a decrease in urine output.  Your urine turns a dark color or changes to pink, red, or brown. Any of the following symptoms occur over the next 10 days:  You have a temperature by mouth above 102 F (38.9 C), not controlled by medicine.  Shortness of breath.  Weakness after normal activity.  The white part of the eye turns yellow (jaundice).  You have a decrease in the amount of urine or are urinating less often.  Your urine turns a dark color or changes to pink, red, or brown. Document Released: 06/04/2000 Document Revised: 08/30/2011 Document Reviewed: 01/22/2008 [facility] Patient Information 2014 [place], [place].  _______________________________________________________________________

## 2016-07-28 ENCOUNTER — Encounter (HOSPITAL_COMMUNITY): Payer: Self-pay

## 2016-07-28 ENCOUNTER — Encounter (HOSPITAL_COMMUNITY)
Admission: RE | Admit: 2016-07-28 | Discharge: 2016-07-28 | Disposition: A | Discharge status: Home / Self Care | Payer: BLUE CROSS/BLUE SHIELD | Source: Ambulatory Visit | Attending: Orthopedic Surgery | Admitting: Orthopedic Surgery

## 2016-07-28 DIAGNOSIS — M25862 Other specified joint disorders, left knee: Secondary | ICD-10-CM | POA: Diagnosis not present

## 2016-07-28 DIAGNOSIS — Z01818 Encounter for other preprocedural examination: Secondary | ICD-10-CM | POA: Insufficient documentation

## 2016-07-28 LAB — COMPREHENSIVE METABOLIC PANEL
ALT: 22 U/L (ref 17–63)
AST: 26 U/L (ref 15–41)
Albumin: 4.4 g/dL (ref 3.5–5.0)
Alkaline Phosphatase: 53 U/L (ref 38–126)
Anion gap: 7 (ref 5–15)
BUN: 19 mg/dL (ref 6–20)
CO2: 26 mmol/L (ref 22–32)
Calcium: 9.2 mg/dL (ref 8.9–10.3)
Chloride: 104 mmol/L (ref 101–111)
Creatinine, Ser: 1.05 mg/dL (ref 0.61–1.24)
GFR calc Af Amer: >60 mL/min (ref >60)
GFR calc non Af Amer: >60 mL/min (ref >60)
Glucose, Bld: 84 mg/dL (ref 65–99)
Potassium: 4.3 mmol/L (ref 3.5–5.1)
Sodium: 137 mmol/L (ref 135–145)
Total Bilirubin: 1.4 mg/dL — ABNORMAL HIGH (ref 0.3–1.2)
Total Protein: 7.7 g/dL (ref 6.5–8.1)

## 2016-07-28 LAB — PROTIME-INR
INR: 1.05
Prothrombin Time: 13.7 seconds (ref 11.4–15.2)

## 2016-07-28 LAB — CBC WITH DIFFERENTIAL/PLATELET
Basophils Absolute: 0 10*3/uL (ref 0.0–0.1)
Basophils Relative: 1 %
Eosinophils Absolute: 0.3 10*3/uL (ref 0.0–0.7)
Eosinophils Relative: 7 %
HCT: 42.9 % (ref 39.0–52.0)
Hemoglobin: 15.1 g/dL (ref 13.0–17.0)
Lymphocytes Relative: 35 %
Lymphs Abs: 1.5 10*3/uL (ref 0.7–4.0)
MCH: 31.1 pg (ref 26.0–34.0)
MCHC: 35.2 g/dL (ref 30.0–36.0)
MCV: 88.3 fL (ref 78.0–100.0)
Monocytes Absolute: 0.5 10*3/uL (ref 0.1–1.0)
Monocytes Relative: 11 %
Neutro Abs: 2 10*3/uL (ref 1.7–7.7)
Neutrophils Relative %: 46 %
Platelets: 166 10*3/uL (ref 150–400)
RBC: 4.86 MIL/uL (ref 4.22–5.81)
RDW: 12.4 % (ref 11.5–15.5)
WBC: 4.3 10*3/uL (ref 4.0–10.5)

## 2016-07-28 LAB — URINALYSIS, ROUTINE W REFLEX MICROSCOPIC
Bilirubin Urine: NEGATIVE
Glucose, UA: NEGATIVE mg/dL
Hgb urine dipstick: NEGATIVE
Ketones, ur: NEGATIVE mg/dL
Leukocytes, UA: NEGATIVE
Nitrite: NEGATIVE
Protein, ur: NEGATIVE mg/dL
Specific Gravity, Urine: 1.017 (ref 1.005–1.030)
pH: 5 (ref 5.0–8.0)

## 2016-07-28 LAB — SURGICAL PCR SCREEN
MRSA, PCR: NEGATIVE
Staphylococcus aureus: POSITIVE — AB

## 2016-08-01 NOTE — H&P (Signed)
Harry Cobb is an 56 y.o. male.    Chief Complaint: Left hip and knee stiffness and pain  Procedure:   1. Left knee open scar debridement, poly exchange             2. Left hip open scar debridement / capsulectomy   HPI: Pt is a 56 y.o. male complaining of left hip and knee  pain for  years. Pain had continually increased since the beginning. X-rays in the clinic show previous THA and TKA in good position and alignment. Pt has tried various conservative treatments which have failed to alleviate their symptoms. Various options are discussed with the patient. Risks, benefits and expectations were discussed with the patient. Patient understand the risks, benefits and expectations and wishes to proceed with surgery.    PCP: Antony Blackbird, MD  D/C Plans:       Home   Post-op Meds:       No Rx given   Tranexamic Acid:      To be given - IV  Decadron:      Is to be given  FYI:     ASA  Norco   PMH: Past Medical History:  Diagnosis Date  . Arthritis   . No pertinent past medical history     PSH: Past Surgical History:  Procedure Laterality Date  . BACK SURGERY  2009  . BICEPS TENDON REPAIR  2011  . EYE SURGERY     Lasik  . JOINT REPLACEMENT     both partial knee replacements  . KNEE ARTHROSCOPY  11/12   lt  . KNEE SURGERY  2009/2011   Rt and Lt knee replacment  . shoulder cyst  06/04/11   right cyst removed  . TONSILLECTOMY    . TOTAL HIP ARTHROPLASTY Left 08/12/2015   Procedure: LEFT TOTAL HIP ARTHROPLASTY ANTERIOR APPROACH;  Surgeon: Paralee Cancel, MD;  Location: [facility];  Service: Orthopedics;  Laterality: Left;  . TOTAL KNEE ARTHROPLASTY Left 11/20/2012   Procedure: CONVERT LEFT UNI KNEE ARTHROPLASTY TO LEFT TOTAL KNEE ARTHROPLASTY;  Surgeon: Mauri Pole, MD;  Location: [facility];  Service: Orthopedics;  Laterality: Left;  . UMBILICAL HERNIA REPAIR  06/04/2011   Procedure: HERNIA REPAIR UMBILICAL ADULT;  Surgeon: Rolm Bookbinder, MD;  Location: [facility];  Service: General;  Laterality: N/A;  umbilical hernia repiar and mesh and removal of lympoma on right shoulder    Social History:  reports that he has never smoked. He has never used smokeless tobacco. He reports that he drinks alcohol. He reports that he does not use drugs.  Allergies:  No Known Allergies  Medications: No current facility-administered medications for this encounter.    Current Outpatient Prescriptions  Medication Sig Dispense Refill  . azelastine (ASTELIN) 0.1 % nasal spray Place 2 sprays into both nostrils daily.  0  . fexofenadine (ALLEGRA) 180 MG tablet Take 180 mg by mouth daily.    Marland Kitchen ipratropium (ATROVENT) 0.06 % nasal spray Place 2 sprays into both nostrils daily.  0  . naproxen (NAPROSYN) 500 MG tablet Take 500 mg by mouth daily.  0  . docusate sodium (COLACE) 100 MG capsule Take 1 capsule (100 mg total) by mouth 2 (two) times daily. (Patient not taking: Reported on 07/26/2016) 10 capsule 0  . ferrous sulfate 325 (65 FE) MG tablet Take 1 tablet (325 mg total) by mouth 3 (three) times daily after meals. (Patient not taking: Reported on 07/26/2016)  3  .  HYDROcodone-acetaminophen (NORCO) 7.5-325 MG tablet Take 1-2 tablets by mouth every 4 (four) hours as needed for moderate pain. (Patient not taking: Reported on 07/26/2016) 100 tablet 0  . methocarbamol (ROBAXIN) 500 MG tablet Take 1 tablet (500 mg total) by mouth every 6 (six) hours as needed for muscle spasms. (Patient not taking: Reported on 07/26/2016) 40 tablet 0  . polyethylene glycol (MIRALAX / GLYCOLAX) packet Take 17 g by mouth 2 (two) times daily. (Patient not taking: Reported on 07/26/2016) 14 each 0    Review of Systems  Constitutional: Negative.   HENT: Negative.   Eyes: Negative.   Respiratory: Negative.   Cardiovascular: Negative.   Gastrointestinal: Negative.   Genitourinary: Negative.   Musculoskeletal: Positive for back pain and joint pain.  Skin: Negative.   Neurological: Negative.     Endo/Heme/Allergies: Negative.   Psychiatric/Behavioral: Negative.        Physical Exam  Constitutional: He is oriented to person, place, and time. He appears well-developed.  HENT:  Head: Normocephalic.  Eyes: Pupils are equal, round, and reactive to light.  Neck: Neck supple. No JVD present. No tracheal deviation present. No thyromegaly present.  Cardiovascular: Normal rate, regular rhythm, normal heart sounds and intact distal pulses.   Respiratory: Effort normal and breath sounds normal. No respiratory distress. He has no wheezes.  GI: Soft. There is no tenderness. There is no guarding.  Musculoskeletal:       Left hip: He exhibits decreased range of motion, decreased strength, tenderness and laceration (healed previous incision). He exhibits no swelling and no deformity.       Left knee: He exhibits decreased range of motion, swelling, laceration (healed previous incision) and bony tenderness. He exhibits no ecchymosis, no deformity and no erythema. Tenderness found.  Lymphadenopathy:    He has no cervical adenopathy.  Neurological: He is alert and oriented to person, place, and time.  Skin: Skin is warm and dry.  Psychiatric: He has a normal mood and affect.       Assessment/Plan Assessment:    Left hip and knee stiffness and pain   Plan: Patient will undergo a 1. Left knee open scar debridement, poly exchange and 2. Left hip open scar debridement / capsulectomy  on 08/03/2016 per Dr. Alvan Dame at [facility]. Risks benefits and expectations were discussed with the patient. Patient understand risks, benefits and expectations and wishes to proceed.     West Pugh Vita Currin   PA-C  08/01/2016, 1:57 PM

## 2016-08-03 ENCOUNTER — Ambulatory Visit (HOSPITAL_COMMUNITY): Payer: BLUE CROSS/BLUE SHIELD | Admitting: Anesthesiology

## 2016-08-03 ENCOUNTER — Encounter (HOSPITAL_COMMUNITY): Payer: Self-pay | Admitting: *Deleted

## 2016-08-03 ENCOUNTER — Encounter (HOSPITAL_COMMUNITY)
Admission: RE | Disposition: A | Discharge status: Home / Self Care | Payer: Self-pay | Source: Ambulatory Visit | Attending: Orthopedic Surgery

## 2016-08-03 ENCOUNTER — Observation Stay (HOSPITAL_COMMUNITY)
Admission: RE | Admit: 2016-08-03 | Discharge: 2016-08-04 | Disposition: A | Discharge status: Home / Self Care | Payer: BLUE CROSS/BLUE SHIELD | Source: Ambulatory Visit | Attending: Orthopedic Surgery | Admitting: Orthopedic Surgery

## 2016-08-03 DIAGNOSIS — Z7982 Long term (current) use of aspirin: Secondary | ICD-10-CM | POA: Diagnosis not present

## 2016-08-03 DIAGNOSIS — M25662 Stiffness of left knee, not elsewhere classified: Secondary | ICD-10-CM | POA: Insufficient documentation

## 2016-08-03 DIAGNOSIS — L905 Scar conditions and fibrosis of skin: Secondary | ICD-10-CM | POA: Diagnosis not present

## 2016-08-03 DIAGNOSIS — M25862 Other specified joint disorders, left knee: Secondary | ICD-10-CM | POA: Diagnosis not present

## 2016-08-03 DIAGNOSIS — Z96642 Presence of left artificial hip joint: Secondary | ICD-10-CM | POA: Diagnosis not present

## 2016-08-03 DIAGNOSIS — M1990 Unspecified osteoarthritis, unspecified site: Secondary | ICD-10-CM | POA: Diagnosis not present

## 2016-08-03 DIAGNOSIS — M25852 Other specified joint disorders, left hip: Secondary | ICD-10-CM | POA: Diagnosis present

## 2016-08-03 DIAGNOSIS — M25652 Stiffness of left hip, not elsewhere classified: Secondary | ICD-10-CM | POA: Insufficient documentation

## 2016-08-03 DIAGNOSIS — Z96652 Presence of left artificial knee joint: Secondary | ICD-10-CM | POA: Diagnosis not present

## 2016-08-03 HISTORY — PX: SCAR REVISION: SHX5285

## 2016-08-03 HISTORY — PX: TOTAL KNEE REVISION WITH SCAR DEBRIDEMENT/PATELLA REVISION WITH POLY EXCHANGE: SHX6128

## 2016-08-03 LAB — TYPE AND SCREEN
ABO/RH(D): O NEG
Antibody Screen: NEGATIVE

## 2016-08-03 SURGERY — TOTAL KNEE REVISION WITH SCAR DEBRIDEMENT/PATELLA REVISION WITH POLY EXCHANGE
Anesthesia: Monitor Anesthesia Care | Site: Knee | Laterality: Left

## 2016-08-03 MED ORDER — PHENOL 1.4 % MT LIQD
1.0000 | OROMUCOSAL | Status: DC | PRN
Start: 2016-08-03 — End: 2016-08-04

## 2016-08-03 MED ORDER — DEXAMETHASONE SODIUM PHOSPHATE 10 MG/ML IJ SOLN: 10.0000 mg | Freq: Once | INTRAMUSCULAR | Status: DC

## 2016-08-03 MED ORDER — PROPOFOL 500 MG/50ML IV EMUL
INTRAVENOUS | Status: DC | PRN
  Administered 2016-08-03: 50 ug/kg/min via INTRAVENOUS

## 2016-08-03 MED ORDER — METOCLOPRAMIDE HCL 5 MG/ML IJ SOLN: 5.0000 mg | Freq: Three times a day (TID) | INTRAMUSCULAR | Status: DC | PRN

## 2016-08-03 MED ORDER — FERROUS SULFATE 325 (65 FE) MG PO TABS: 325.0000 mg | ORAL_TABLET | Freq: Three times a day (TID) | ORAL | Status: DC

## 2016-08-03 MED ORDER — KETOROLAC TROMETHAMINE 30 MG/ML IJ SOLN
INTRAMUSCULAR | Status: AC
  Filled 2016-08-03: qty 1

## 2016-08-03 MED ORDER — LORATADINE 10 MG PO TABS
10.0000 mg | ORAL_TABLET | Freq: Every day | ORAL | Status: DC
  Administered 2016-08-04: 10 mg via ORAL
  Filled 2016-08-03: qty 1

## 2016-08-03 MED ORDER — MIDAZOLAM HCL 2 MG/2ML IJ SOLN
INTRAMUSCULAR | Status: AC
  Filled 2016-08-03: qty 2

## 2016-08-03 MED ORDER — PROPOFOL 10 MG/ML IV BOLUS
INTRAVENOUS | Status: AC
  Filled 2016-08-03: qty 20

## 2016-08-03 MED ORDER — BUPIVACAINE HCL (PF) 0.25 % IJ SOLN
INTRAMUSCULAR | Status: AC
  Filled 2016-08-03: qty 30

## 2016-08-03 MED ORDER — ONDANSETRON HCL 4 MG/2ML IJ SOLN
INTRAMUSCULAR | Status: AC
  Filled 2016-08-03: qty 2

## 2016-08-03 MED ORDER — ROCURONIUM BROMIDE 50 MG/5ML IV SOSY
PREFILLED_SYRINGE | INTRAVENOUS | Status: AC
  Filled 2016-08-03: qty 5

## 2016-08-03 MED ORDER — CELECOXIB 200 MG PO CAPS
200.0000 mg | ORAL_CAPSULE | Freq: Two times a day (BID) | ORAL | Status: DC
  Administered 2016-08-03 – 2016-08-04 (×2): 200 mg via ORAL
  Filled 2016-08-03 (×2): qty 1

## 2016-08-03 MED ORDER — SODIUM CHLORIDE 0.9 % IV SOLN: INTRAVENOUS | Status: DC

## 2016-08-03 MED ORDER — HYDROMORPHONE HCL 1 MG/ML IJ SOLN
0.2500 mg | INTRAMUSCULAR | Status: DC | PRN
Start: 2016-08-03 — End: 2016-08-03
  Administered 2016-08-03 (×4): 0.5 mg via INTRAVENOUS

## 2016-08-03 MED ORDER — MIDAZOLAM HCL 2 MG/2ML IJ SOLN
INTRAMUSCULAR | Status: DC | PRN
  Administered 2016-08-03 (×2): 0.5 mg via INTRAVENOUS
  Administered 2016-08-03: 1 mg via INTRAVENOUS
  Administered 2016-08-03: 0.5 mg via INTRAVENOUS
  Administered 2016-08-03 (×3): 1 mg via INTRAVENOUS
  Administered 2016-08-03: 0.5 mg via INTRAVENOUS

## 2016-08-03 MED ORDER — DEXAMETHASONE SODIUM PHOSPHATE 10 MG/ML IJ SOLN
10.0000 mg | Freq: Once | INTRAMUSCULAR | Status: AC
  Administered 2016-08-03: 10 mg via INTRAVENOUS

## 2016-08-03 MED ORDER — BUPIVACAINE HCL 0.25 % IJ SOLN
INTRAMUSCULAR | Status: DC | PRN
  Administered 2016-08-03: 30 mL

## 2016-08-03 MED ORDER — METOCLOPRAMIDE HCL 5 MG PO TABS: 5.0000 mg | ORAL_TABLET | Freq: Three times a day (TID) | ORAL | Status: DC | PRN

## 2016-08-03 MED ORDER — MAGNESIUM CITRATE PO SOLN: 1.0000 | Freq: Once | ORAL | Status: DC | PRN

## 2016-08-03 MED ORDER — CEFAZOLIN SODIUM-DEXTROSE 2-4 GM/100ML-% IV SOLN
2.0000 g | Freq: Four times a day (QID) | INTRAVENOUS | Status: AC
  Administered 2016-08-03 – 2016-08-04 (×2): 2 g via INTRAVENOUS
  Filled 2016-08-03 (×2): qty 100

## 2016-08-03 MED ORDER — BISACODYL 10 MG RE SUPP: 10.0000 mg | Freq: Every day | RECTAL | Status: DC | PRN

## 2016-08-03 MED ORDER — CHLORHEXIDINE GLUCONATE 4 % EX LIQD: 60.0000 mL | Freq: Once | CUTANEOUS | Status: DC

## 2016-08-03 MED ORDER — METHOCARBAMOL 500 MG PO TABS
500.0000 mg | ORAL_TABLET | Freq: Four times a day (QID) | ORAL | Status: DC | PRN
  Administered 2016-08-03 – 2016-08-04 (×2): 500 mg via ORAL
  Filled 2016-08-03 (×2): qty 1

## 2016-08-03 MED ORDER — HYDROMORPHONE HCL 1 MG/ML IJ SOLN
0.5000 mg | INTRAMUSCULAR | Status: DC | PRN
  Administered 2016-08-03: 1 mg via INTRAVENOUS
  Filled 2016-08-03: qty 1

## 2016-08-03 MED ORDER — SODIUM CHLORIDE 0.9 % IR SOLN
Status: DC | PRN
  Administered 2016-08-03: 1000 mL

## 2016-08-03 MED ORDER — AZELASTINE HCL 0.1 % NA SOLN: 2.0000 | Freq: Every day | NASAL | Status: DC

## 2016-08-03 MED ORDER — IPRATROPIUM BROMIDE 0.06 % NA SOLN
2.0000 | Freq: Every day | NASAL | Status: DC
  Filled 2016-08-03: qty 15

## 2016-08-03 MED ORDER — DEXAMETHASONE SODIUM PHOSPHATE 10 MG/ML IJ SOLN
INTRAMUSCULAR | Status: AC
  Filled 2016-08-03: qty 1

## 2016-08-03 MED ORDER — FENTANYL CITRATE (PF) 250 MCG/5ML IJ SOLN
INTRAMUSCULAR | Status: DC | PRN
  Administered 2016-08-03 (×2): 50 ug via INTRAVENOUS

## 2016-08-03 MED ORDER — CEFAZOLIN SODIUM-DEXTROSE 2-4 GM/100ML-% IV SOLN
2.0000 g | INTRAVENOUS | Status: AC
  Administered 2016-08-03: 2 g via INTRAVENOUS
  Filled 2016-08-03: qty 100

## 2016-08-03 MED ORDER — ONDANSETRON HCL 4 MG/2ML IJ SOLN: 4.0000 mg | Freq: Four times a day (QID) | INTRAMUSCULAR | Status: DC | PRN

## 2016-08-03 MED ORDER — ONDANSETRON HCL 4 MG/2ML IJ SOLN
INTRAMUSCULAR | Status: DC | PRN
  Administered 2016-08-03: 4 mg via INTRAVENOUS

## 2016-08-03 MED ORDER — FENTANYL CITRATE (PF) 100 MCG/2ML IJ SOLN
INTRAMUSCULAR | Status: AC
  Filled 2016-08-03: qty 2

## 2016-08-03 MED ORDER — DIPHENHYDRAMINE HCL 25 MG PO CAPS: 25.0000 mg | ORAL_CAPSULE | Freq: Four times a day (QID) | ORAL | Status: DC | PRN

## 2016-08-03 MED ORDER — ALUM & MAG HYDROXIDE-SIMETH 200-200-20 MG/5ML PO SUSP: 30.0000 mL | ORAL | Status: DC | PRN

## 2016-08-03 MED ORDER — KETOROLAC TROMETHAMINE 30 MG/ML IJ SOLN
INTRAMUSCULAR | Status: DC | PRN
  Administered 2016-08-03: 30 mg via INTRAVENOUS

## 2016-08-03 MED ORDER — HYDROMORPHONE HCL 1 MG/ML IJ SOLN
INTRAMUSCULAR | Status: AC
  Administered 2016-08-03: 0.5 mg via INTRAVENOUS
  Filled 2016-08-03: qty 1

## 2016-08-03 MED ORDER — ASPIRIN 81 MG PO CHEW
81.0000 mg | CHEWABLE_TABLET | Freq: Two times a day (BID) | ORAL | Status: DC
  Administered 2016-08-03 – 2016-08-04 (×2): 81 mg via ORAL
  Filled 2016-08-03 (×2): qty 1

## 2016-08-03 MED ORDER — ONDANSETRON HCL 4 MG/2ML IJ SOLN: 4.0000 mg | Freq: Once | INTRAMUSCULAR | Status: DC | PRN

## 2016-08-03 MED ORDER — ONDANSETRON HCL 4 MG PO TABS: 4.0000 mg | ORAL_TABLET | Freq: Four times a day (QID) | ORAL | Status: DC | PRN

## 2016-08-03 MED ORDER — MEPERIDINE HCL 50 MG/ML IJ SOLN: 6.2500 mg | INTRAMUSCULAR | Status: DC | PRN

## 2016-08-03 MED ORDER — SODIUM CHLORIDE 0.9 % IV SOLN
INTRAVENOUS | Status: DC
  Administered 2016-08-03: 19:00:00 via INTRAVENOUS
  Filled 2016-08-03 (×3): qty 1000

## 2016-08-03 MED ORDER — METHOCARBAMOL 1000 MG/10ML IJ SOLN
500.0000 mg | Freq: Four times a day (QID) | INTRAMUSCULAR | Status: DC | PRN
  Administered 2016-08-03: 500 mg via INTRAVENOUS
  Filled 2016-08-03: qty 550
  Filled 2016-08-03: qty 5

## 2016-08-03 MED ORDER — BUPIVACAINE-EPINEPHRINE (PF) 0.25% -1:200000 IJ SOLN: INTRAMUSCULAR | Status: DC | PRN

## 2016-08-03 MED ORDER — POLYETHYLENE GLYCOL 3350 17 G PO PACK
17.0000 g | PACK | Freq: Two times a day (BID) | ORAL | Status: DC
  Administered 2016-08-03 – 2016-08-04 (×2): 17 g via ORAL
  Filled 2016-08-03 (×2): qty 1

## 2016-08-03 MED ORDER — SODIUM CHLORIDE 0.9 % IJ SOLN
INTRAMUSCULAR | Status: AC
  Filled 2016-08-03: qty 50

## 2016-08-03 MED ORDER — CEFAZOLIN SODIUM-DEXTROSE 2-4 GM/100ML-% IV SOLN
INTRAVENOUS | Status: AC
  Filled 2016-08-03: qty 100

## 2016-08-03 MED ORDER — TRANEXAMIC ACID 1000 MG/10ML IV SOLN
1000.0000 mg | INTRAVENOUS | Status: AC
  Administered 2016-08-03: 1000 mg via INTRAVENOUS
  Filled 2016-08-03: qty 10

## 2016-08-03 MED ORDER — MENTHOL 3 MG MT LOZG: 1.0000 | LOZENGE | OROMUCOSAL | Status: DC | PRN

## 2016-08-03 MED ORDER — LACTATED RINGERS IV SOLN
INTRAVENOUS | Status: DC
  Administered 2016-08-03: 16:00:00 via INTRAVENOUS
  Administered 2016-08-03: 1000 mL via INTRAVENOUS
  Administered 2016-08-03: 14:00:00 via INTRAVENOUS

## 2016-08-03 MED ORDER — PROPOFOL 10 MG/ML IV BOLUS
INTRAVENOUS | Status: DC | PRN
  Administered 2016-08-03 (×3): 20 mg via INTRAVENOUS

## 2016-08-03 MED ORDER — FLUTICASONE PROPIONATE 50 MCG/ACT NA SUSP
2.0000 | Freq: Every day | NASAL | Status: DC
  Filled 2016-08-03: qty 16

## 2016-08-03 MED ORDER — SODIUM CHLORIDE 0.9 % IR SOLN
Status: DC | PRN
  Administered 2016-08-03: 2000 mL

## 2016-08-03 MED ORDER — DOCUSATE SODIUM 100 MG PO CAPS
100.0000 mg | ORAL_CAPSULE | Freq: Two times a day (BID) | ORAL | Status: DC
  Administered 2016-08-03 – 2016-08-04 (×2): 100 mg via ORAL
  Filled 2016-08-03 (×2): qty 1

## 2016-08-03 MED ORDER — SODIUM CHLORIDE 0.9 % IJ SOLN
INTRAMUSCULAR | Status: DC | PRN
  Administered 2016-08-03: 30 mL via INTRAVENOUS

## 2016-08-03 MED ORDER — HYDROCODONE-ACETAMINOPHEN 7.5-325 MG PO TABS
1.0000 | ORAL_TABLET | ORAL | Status: DC
  Administered 2016-08-03: 1 via ORAL
  Administered 2016-08-03: 2 via ORAL
  Administered 2016-08-03: 1 via ORAL
  Administered 2016-08-04 (×3): 2 via ORAL
  Filled 2016-08-03 (×2): qty 2
  Filled 2016-08-03: qty 1
  Filled 2016-08-03 (×2): qty 2
  Filled 2016-08-03: qty 1

## 2016-08-03 MED ORDER — PROPOFOL 10 MG/ML IV BOLUS
INTRAVENOUS | Status: AC
  Filled 2016-08-03: qty 60

## 2016-08-03 SURGICAL SUPPLY — 66 items
BAG ZIPLOCK 12X15 (MISCELLANEOUS) ×3 IMPLANT
BANDAGE ACE 6X5 VEL STRL LF (GAUZE/BANDAGES/DRESSINGS) ×3 IMPLANT
BANDAGE ESMARK 6X9 LF (GAUZE/BANDAGES/DRESSINGS) IMPLANT
BLADE SAW SGTL 13.0X1.19X90.0M (BLADE) IMPLANT
BLADE SAW SGTL 81X20 HD (BLADE) IMPLANT
BNDG ELASTIC 6X10 VLCR STRL LF (GAUZE/BANDAGES/DRESSINGS) ×3 IMPLANT
BNDG ESMARK 6X9 LF (GAUZE/BANDAGES/DRESSINGS)
BOWL SMART MIX CTS (DISPOSABLE) IMPLANT
CUFF TOURN SGL QUICK 34 (TOURNIQUET CUFF)
CUFF TRNQT CYL 34X4X40X1 (TOURNIQUET CUFF) IMPLANT
DERMABOND ADVANCED (GAUZE/BANDAGES/DRESSINGS) ×2
DERMABOND ADVANCED .7 DNX12 (GAUZE/BANDAGES/DRESSINGS) ×4 IMPLANT
DRAPE EXTREMITY T 121X128X90 (DRAPE) IMPLANT
DRAPE INCISE IOBAN 66X45 STRL (DRAPES) ×3 IMPLANT
DRAPE ORTHO SPLIT 77X108 STRL (DRAPES) ×2
DRAPE POUCH INSTRU U-SHP 10X18 (DRAPES) ×3 IMPLANT
DRAPE SURG ORHT 6 SPLT 77X108 (DRAPES) ×4 IMPLANT
DRAPE U-SHAPE 47X51 STRL (DRAPES) ×6 IMPLANT
DRESSING AQUACEL AG SP 3.5X10 (GAUZE/BANDAGES/DRESSINGS) IMPLANT
DRSG ADAPTIC 3X8 NADH LF (GAUZE/BANDAGES/DRESSINGS) IMPLANT
DRSG AQUACEL AG ADV 3.5X10 (GAUZE/BANDAGES/DRESSINGS) ×6 IMPLANT
DRSG AQUACEL AG SP 3.5X10 (GAUZE/BANDAGES/DRESSINGS)
DRSG PAD ABDOMINAL 8X10 ST (GAUZE/BANDAGES/DRESSINGS) IMPLANT
DURAPREP 26ML APPLICATOR (WOUND CARE) ×6 IMPLANT
ELECT REM PT RETURN 9FT ADLT (ELECTROSURGICAL) ×3
ELECTRODE REM PT RTRN 9FT ADLT (ELECTROSURGICAL) ×2 IMPLANT
FACESHIELD WRAPAROUND (MASK) ×12 IMPLANT
GAUZE SPONGE 4X4 12PLY STRL (GAUZE/BANDAGES/DRESSINGS) IMPLANT
GLOVE BIOGEL M 7.0 STRL (GLOVE) IMPLANT
GLOVE BIOGEL PI IND STRL 7.5 (GLOVE) ×2 IMPLANT
GLOVE BIOGEL PI IND STRL 8.5 (GLOVE) ×4 IMPLANT
GLOVE BIOGEL PI INDICATOR 7.5 (GLOVE) ×1
GLOVE BIOGEL PI INDICATOR 8.5 (GLOVE) ×2
GLOVE ECLIPSE 8.0 STRL XLNG CF (GLOVE) ×3 IMPLANT
GLOVE ORTHO TXT STRL SZ7.5 (GLOVE) ×6 IMPLANT
GOWN STRL REUS W/TWL LRG LVL3 (GOWN DISPOSABLE) ×6 IMPLANT
GOWN STRL REUS W/TWL XL LVL3 (GOWN DISPOSABLE) ×6 IMPLANT
HANDPIECE INTERPULSE COAX TIP (DISPOSABLE) ×1
IMMOBILIZER KNEE 20 (SOFTGOODS)
IMMOBILIZER KNEE 20 THIGH 36 (SOFTGOODS) IMPLANT
MANIFOLD NEPTUNE II (INSTRUMENTS) ×3 IMPLANT
NDL SAFETY ECLIPSE 18X1.5 (NEEDLE) ×2 IMPLANT
NEEDLE HYPO 18GX1.5 SHARP (NEEDLE) ×1
NS IRRIG 1000ML POUR BTL (IV SOLUTION) ×3 IMPLANT
PACK TOTAL KNEE CUSTOM (KITS) ×3 IMPLANT
PADDING CAST COTTON 6X4 STRL (CAST SUPPLIES) IMPLANT
PLATE ROT INSERT 15MM SIZE 4 (Plate) ×3 IMPLANT
POSITIONER SURGICAL ARM (MISCELLANEOUS) ×3 IMPLANT
SET HNDPC FAN SPRY TIP SCT (DISPOSABLE) ×2 IMPLANT
SET PAD KNEE POSITIONER (MISCELLANEOUS) ×3 IMPLANT
SPONGE LAP 18X18 X RAY DECT (DISPOSABLE) IMPLANT
STAPLER VISISTAT 35W (STAPLE) IMPLANT
SUCTION FRAZIER HANDLE 12FR (TUBING)
SUCTION TUBE FRAZIER 12FR DISP (TUBING) IMPLANT
SUT MNCRL AB 4-0 PS2 18 (SUTURE) ×6 IMPLANT
SUT VIC AB 1 CT1 36 (SUTURE) ×9 IMPLANT
SUT VIC AB 2-0 CT1 27 (SUTURE) ×3
SUT VIC AB 2-0 CT1 TAPERPNT 27 (SUTURE) ×6 IMPLANT
SUT VLOC 180 0 24IN GS25 (SUTURE) ×6 IMPLANT
SYR 50ML LL SCALE MARK (SYRINGE) ×3 IMPLANT
TOWEL OR 17X26 10 PK STRL BLUE (TOWEL DISPOSABLE) ×6 IMPLANT
TOWER CARTRIDGE SMART MIX (DISPOSABLE) IMPLANT
TRAY FOLEY W/METER SILVER 16FR (SET/KITS/TRAYS/PACK) ×3 IMPLANT
WATER STERILE IRR 1500ML POUR (IV SOLUTION) ×3 IMPLANT
WRAP KNEE MAXI GEL POST OP (GAUZE/BANDAGES/DRESSINGS) ×3 IMPLANT
YANKAUER SUCT BULB TIP 10FT TU (MISCELLANEOUS) ×3 IMPLANT

## 2016-08-03 NOTE — Anesthesia Preprocedure Evaluation (Signed)
Anesthesia Evaluation  Patient identified by MRN, date of birth, ID band Patient awake    Reviewed: Allergy & Precautions, NPO status , Patient's Chart, lab work & pertinent test results  Airway Mallampati: I  TM Distance: >3 FB Neck ROM: Full    Dental   Pulmonary    Pulmonary exam normal        Cardiovascular Normal cardiovascular exam     Neuro/Psych    GI/Hepatic   Endo/Other    Renal/GU      Musculoskeletal   Abdominal   Peds  Hematology   Anesthesia Other Findings   Reproductive/Obstetrics                             Anesthesia Physical Anesthesia Plan  ASA: II  Anesthesia Plan: Spinal and MAC   Post-op Pain Management:    Induction: Intravenous  Airway Management Planned: Simple Face Mask  Additional Equipment:   Intra-op Plan:   Post-operative Plan:   Informed Consent: I have reviewed the patients History and Physical, chart, labs and discussed the procedure including the risks, benefits and alternatives for the proposed anesthesia with the patient or authorized representative who has indicated his/her understanding and acceptance.     Plan Discussed with: CRNA and Surgeon  Anesthesia Plan Comments:         Anesthesia Quick Evaluation

## 2016-08-03 NOTE — Anesthesia Procedure Notes (Signed)
Spinal  Patient location during procedure: OR Start time: 08/03/2016 12:36 PM End time: 08/03/2016 12:40 PM Staffing Anesthesiologist: Lillia Abed Performed: anesthesiologist  Preanesthetic Checklist Completed: patient identified, surgical consent, pre-op evaluation, timeout performed, IV checked, risks and benefits discussed and monitors and equipment checked Spinal Block Patient position: sitting Prep: DuraPrep Patient monitoring: heart rate, cardiac monitor, continuous pulse ox and blood pressure Approach: right paramedian Location: L3-4 Injection technique: single-shot Needle Needle type: Pencan  Needle gauge: 24 G Needle length: 9 cm Needle insertion depth: 6 cm

## 2016-08-03 NOTE — Discharge Instructions (Signed)

## 2016-08-03 NOTE — Interval H&P Note (Signed)
History and Physical Interval Note:  08/03/2016 11:39 AM  Harry Cobb  has presented today for surgery, with the diagnosis of Left knee after total knee arthroplasty patella clunk, left hip excesive scarring and pain after total hip arthroplasty  The various methods of treatment have been discussed with the patient and family. After consideration of risks, benefits and other options for treatment, the patient has consented to  Procedure(s): Left knee open scar debridement, poly exchange  (Left) Left hip open scar debridement  (Left) as a surgical intervention .  The patient's history has been reviewed, patient examined, no change in status, stable for surgery.  I have reviewed the patient's chart and labs.  Questions were answered to the patient's satisfaction.     Mauri Pole

## 2016-08-03 NOTE — Anesthesia Procedure Notes (Signed)
Procedure Name: MAC Date/Time: 08/03/2016 12:51 PM Performed by: Dione Booze Pre-anesthesia Checklist: Patient identified, Emergency Drugs available, Suction available and Patient being monitored Patient Re-evaluated:Patient Re-evaluated prior to inductionOxygen Delivery Method: Simple face mask Placement Confirmation: positive ETCO2

## 2016-08-03 NOTE — Brief Op Note (Signed)
08/03/2016  4:19 PM  PATIENT:  Harry Cobb  56 y.o. male  PRE-OPERATIVE DIAGNOSIS:  Left knee after total knee arthroplasty patella clunk, left hip excesive scarring and pain after total hip arthroplasty  POST-OPERATIVE DIAGNOSIS:  excesive scarring and pain after total hip arthroplastyLeft knee after total knee arthroplasty patella clunk, left hip  PROCEDURE:  Procedure(s): Left knee open scar debridement, poly exchange  (Left) Left hip open scar debridement  (Left)   See dictation  SURGEON:  Surgeon(s) and Role:    * Paralee Cancel, MD - Primary  PHYSICIAN ASSISTANT: Danae Orleans, PA-C during knee component of procedure   ASSISTANTS: Nehemiah Massed, PA-C during hip component of procedue  ANESTHESIA:   spinal  EBL:  Total I/O In: 1900 [I.V.:1900] Out: 1200 [Urine:500; Blood:700]  BLOOD ADMINISTERED:none  DRAINS: none   LOCAL MEDICATIONS USED:  MARCAINE     SPECIMEN:  No Specimen  DISPOSITION OF SPECIMEN:  N/A  COUNTS:  YES  TOURNIQUET:  Not utilized  DICTATION: .Other Dictation: Dictation Number [id number]  PLAN OF CARE: Admit for overnight observation  PATIENT DISPOSITION:  PACU - hemodynamically stable.   Delay start of Pharmacological VTE agent (>24hrs) due to surgical blood loss or risk of bleeding: no

## 2016-08-03 NOTE — Transfer of Care (Signed)
Immediate Anesthesia Transfer of Care Note  Patient: ALTAN KRAAI  Procedure(s) Performed: Procedure(s): Left knee open scar debridement, poly exchange  (Left) Left hip open scar debridement  (Left)  Patient Location: PACU  Anesthesia Type:MAC and Spinal  Level of Consciousness: awake and patient cooperative  Airway & Oxygen Therapy: Patient Spontanous Breathing and Patient connected to face mask oxygen  Post-op Assessment: Report given to RN and Post -op Vital signs reviewed and stable  Post vital signs: Reviewed  Last Vitals:  Vitals:   08/03/16 1003  BP: 127/84  Pulse: (!) 55  Resp: 16  Temp: 36.7 C    Last Pain:  Vitals:   08/03/16 1003  TempSrc: Oral      Patients Stated Pain Goal: 3 (58/52/77 [id number])  Complications: No apparent anesthesia complications

## 2016-08-04 ENCOUNTER — Encounter (HOSPITAL_COMMUNITY): Payer: Self-pay | Admitting: Orthopedic Surgery

## 2016-08-04 DIAGNOSIS — M25852 Other specified joint disorders, left hip: Secondary | ICD-10-CM | POA: Diagnosis not present

## 2016-08-04 LAB — BASIC METABOLIC PANEL
Anion gap: 4 — ABNORMAL LOW (ref 5–15)
BUN: 21 mg/dL — ABNORMAL HIGH (ref 6–20)
CALCIUM: 8.4 mg/dL — AB (ref 8.9–10.3)
CO2: 26 mmol/L (ref 22–32)
Chloride: 107 mmol/L (ref 101–111)
Creatinine, Ser: 0.98 mg/dL (ref 0.61–1.24)
GFR calc Af Amer: >60 mL/min (ref >60)
GFR calc non Af Amer: >60 mL/min (ref >60)
GLUCOSE: 148 mg/dL — AB (ref 65–99)
Potassium: 4.1 mmol/L (ref 3.5–5.1)
Sodium: 137 mmol/L (ref 135–145)

## 2016-08-04 LAB — CBC
HEMATOCRIT: 35.2 % — AB (ref 39.0–52.0)
Hemoglobin: 12.3 g/dL — ABNORMAL LOW (ref 13.0–17.0)
MCH: 31 pg (ref 26.0–34.0)
MCHC: 34.9 g/dL (ref 30.0–36.0)
MCV: 88.7 fL (ref 78.0–100.0)
Platelets: 170 10*3/uL (ref 150–400)
RBC: 3.97 MIL/uL — ABNORMAL LOW (ref 4.22–5.81)
RDW: 12.6 % (ref 11.5–15.5)
WBC: 9.8 10*3/uL (ref 4.0–10.5)

## 2016-08-04 MED ORDER — HYDROCODONE-ACETAMINOPHEN 7.5-325 MG PO TABS: 1.0000 | ORAL_TABLET | ORAL | 0 refills | Status: DC | PRN

## 2016-08-04 MED ORDER — ASPIRIN 81 MG PO CHEW: 81.0000 mg | CHEWABLE_TABLET | Freq: Two times a day (BID) | ORAL | 0 refills | Status: AC

## 2016-08-04 MED ORDER — FERROUS SULFATE 325 (65 FE) MG PO TABS: 325.0000 mg | ORAL_TABLET | Freq: Three times a day (TID) | ORAL | 3 refills | Status: DC

## 2016-08-04 MED ORDER — DOCUSATE SODIUM 100 MG PO CAPS: 100.0000 mg | ORAL_CAPSULE | Freq: Two times a day (BID) | ORAL | 0 refills | Status: DC

## 2016-08-04 MED ORDER — CELECOXIB 200 MG PO CAPS: 200.0000 mg | ORAL_CAPSULE | Freq: Two times a day (BID) | ORAL | 1 refills | Status: DC

## 2016-08-04 MED ORDER — POLYETHYLENE GLYCOL 3350 17 G PO PACK: 17.0000 g | PACK | Freq: Two times a day (BID) | ORAL | 0 refills | Status: DC

## 2016-08-04 MED ORDER — METHOCARBAMOL 500 MG PO TABS: 500.0000 mg | ORAL_TABLET | Freq: Four times a day (QID) | ORAL | 0 refills | Status: DC | PRN

## 2016-08-04 NOTE — Progress Notes (Signed)
     Subjective: 1 Day Post-Op Procedure(s) (LRB): Left knee open scar debridement, poly exchange  (Left) Left hip open scar debridement  (Left)   Patient reports pain as mild, pain controlled. No events throughout the night. Feels better today as compared to yesterday.  We discussed the findings and surgery performed. Discussed the recovery process and expectations.  Ready to be discharged home.  Objective:   VITALS:   Vitals:   08/04/16 0218 08/04/16 0605  BP: 105/68 109/64  Pulse: (!) 56 (!) 55  Resp: 17 17  Temp: 98.4 F (36.9 C) 98.3 F (36.8 C)    Dorsiflexion/Plantar flexion intact Incision: dressing C/D/I No cellulitis present Compartment soft  LABS  Recent Labs  08/04/16 0443  HGB 12.3*  HCT 35.2*  WBC 9.8  PLT 170     Recent Labs  08/04/16 0443  NA 137  K 4.1  BUN 21*  CREATININE 0.98  GLUCOSE 148*     Assessment/Plan: 1 Day Post-Op Procedure(s) (LRB): Left knee open scar debridement, poly exchange  (Left) Left hip open scar debridement  (Left) Foley cath d/c'ed Advance diet Up with therapy D/C IV fluids Discharge home Follow up in 2 weeks at [facility]. Follow up with OLIN,Bathsheba Durrett D in 2 weeks.  Contact information:  [facility] [address], [place] [id number]    Overweight (BMI 25-29.9) Estimated body mass index is 28.89 kg/m as calculated from the following:   Height as of this encounter: 6' (1.829 m).   Weight as of this encounter: 96.6 kg (213 lb). Patient also counseled that weight may inhibit the healing process Patient counseled that losing weight will help with future health issues      Harry Cobb. Harry Cobb   PAC  08/04/2016, 8:55 AM

## 2016-08-04 NOTE — Evaluation (Signed)
Physical Therapy Evaluation Patient Details Name: BURHAN BARHAM MRN: [id number] DOB: 1961/01/06 Today's Date: 08/04/2016   History of Present Illness  56 yo male s/p L hip open scar debridement, L knee scar debridement, poly exchange 08/03/16. Hx of L THA 07/2015, L TKA 11/2012    Clinical Impression  On eval, pt was supervision level assist for mobility. He walked ~200 feet with a RW and ascended/descended 4 steps with use of 1 rail. Pain ~5/10 with activity. Issued HEP for pt to perform 2x/day until he follows up with MD. MD will decide if he needs further PT at that point. Encouraged pt to use an assistive device, at the very least for a few days, until pain is well controlled with activity and gait pattern is normalized. All education completed. Ready to d/c from PT standpoint-made RN aware.     Follow Up Recommendations No PT follow up (OP PT, if needed, after follow up with MD)    Equipment Recommendations  None recommended by PT (Pt stated he has crutches at home. )    Recommendations for Other Services       Precautions / Restrictions Precautions Precautions: None Restrictions Weight Bearing Restrictions: No LLE Weight Bearing: Weight bearing as tolerated      Mobility  Bed Mobility Overal bed mobility: Modified Independent                Transfers Overall transfer level: Modified independent                  Ambulation/Gait Ambulation/Gait assistance: Supervision Ambulation Distance (Feet): 200 Feet Assistive device: Rolling walker (2 wheeled) Gait Pattern/deviations: Step-through pattern;Decreased stride length     General Gait Details: good gait speed. No LOB. Pain well controlled.   Stairs Stairs: Yes Stairs assistance: Supervision Stair Management: Step to pattern;Forwards;One rail Right Number of Stairs: 4 General stair comments: VCs safety, technique, sequence. Supervision for safety.   Wheelchair Mobility    Modified Rankin (Stroke  Patients Only)       Balance                                             Pertinent Vitals/Pain Pain Assessment: 0-10 Pain Score: 5  Pain Location: L LE with activity Pain Intervention(s): Ice applied    Home Living Family/patient expects to be discharged to:: Private residence Living Arrangements: Spouse/significant other Available Help at Discharge: Family Type of Home: House Home Access: Stairs to enter Entrance Stairs-Rails: None Entrance Stairs-Number of Steps: 2 front; 6 back (has rail) Home Layout: Two level;Bed/bath upstairs Home Equipment: Crutches      Prior Function Level of Independence: Independent               Hand Dominance        Extremity/Trunk Assessment   Upper Extremity Assessment Upper Extremity Assessment: Overall WFL for tasks assessed    Lower Extremity Assessment Lower Extremity Assessment: Generalized weakness (s/p L hip and knee surgery. L knee ROM ~5-90 degrees)    Cervical / Trunk Assessment Cervical / Trunk Assessment: Normal  Communication   Communication: No difficulties  Cognition Arousal/Alertness: Awake/alert Behavior During Therapy: WFL for tasks assessed/performed Overall Cognitive Status: Within Functional Limits for tasks assessed                      General Comments  Exercises General Exercises - Lower Extremity Hip ABduction/ADduction: AROM;Left;10 reps;Standing Hip Flexion/Marching: AROM;Both;10 reps;Standing Heel Raises: AROM;Both;10 reps;Standing Other Exercises Other Exercises: Knee flexion, 10 reps, standing. Seated knee flexion, 10 reps, AAROM with towel under foot to aid sliding   Assessment/Plan    PT Assessment All further PT needs can be met in the next venue of care (OP PT, if needed, after follow up with MD)  PT Problem List Decreased strength;Decreased mobility;Decreased range of motion;Decreased activity tolerance;Pain;Decreased knowledge of use of DME           PT Treatment Interventions      PT Goals (Current goals can be found in the Care Plan section)  Acute Rehab PT Goals Patient Stated Goal: return to golf PT Goal Formulation: All assessment and education complete, DC therapy    Frequency     Barriers to discharge        Co-evaluation               End of Session Equipment Utilized During Treatment: Gait belt Activity Tolerance: Patient tolerated treatment well Patient left: in chair;with call bell/phone within reach;with family/visitor present      Functional Assessment Tool Used: clinical judgement Functional Limitation: Mobility: Walking and moving around Mobility: Walking and Moving Around Current Status (W8616): At least 1 percent but less than 20 percent impaired, limited or restricted Mobility: Walking and Moving Around Goal Status [phone]): At least 1 percent but less than 20 percent impaired, limited or restricted    Time: 0951-1015 PT Time Calculation (min) (ACUTE ONLY): 24 min   Charges:   PT Evaluation $PT Eval Low Complexity: 1 Procedure     PT G Codes:   PT G-Codes **NOT FOR INPATIENT CLASS** Functional Assessment Tool Used: clinical judgement Functional Limitation: Mobility: Walking and moving around Mobility: Walking and Moving Around Current Status (M2111): At least 1 percent but less than 20 percent impaired, limited or restricted Mobility: Walking and Moving Around Goal Status [phone]): At least 1 percent but less than 20 percent impaired, limited or restricted    Weston Anna, MPT Pager: [phone]

## 2016-08-04 NOTE — Progress Notes (Signed)
OT Cancellation Note  Patient Details Name: Harry Cobb MRN: [id number] DOB: 01-Jun-1961   Cancelled Treatment:    Reason Eval/Treat Not Completed: PT screened, no needs identified, will sign off  Charlisha Market 08/04/2016, 11:08 AM  Lesle Chris, OTR/L [phone] 08/04/2016

## 2016-08-04 NOTE — Op Note (Signed)
NAMEAVRAM, MIHELIC NO.:  [id number]  MEDICAL RECORD NO.:  [id number]  LOCATION:                                 FACILITY:  PHYSICIAN:  Pietro Cassis. Alvan Dame, M.D.       DATE OF BIRTH:  DATE OF PROCEDURE:  08/03/2016 DATE OF DISCHARGE:                              OPERATIVE REPORT   PREOPERATIVE DIAGNOSES: 1. Excessive intra-articular knee scarring upon total knee     arthroplasty consistent with patellar clunk. 2. Excessive scarring around left hip resulting in impingement pain     and functional limitations.  POSTOPERATIVE DIAGNOSES: 1. Excessive intra-articular knee scarring upon total knee     arthroplasty consistent with patellar clunk. 2. Excessive scarring around left hip resulting in impingement pain     and functional limitations.  FINDINGS:  Please see the body of the dictated operative note for the extent of the findings of both the knee and the left hip.  PROCEDURE: 1. Excisional debridement, left knee of a 10 inch incision including     scalp utilization, debridement of skin, subcutaneous tissue,     excessive scarring within the knee, removal of extra bone formation     as well. 2. Left knee polyethylene exchange revising from a size 12.5-15 mm,     posterior stabilized insert to match the size 4 femur. 3. Open excisional debridement of left hip of an 8 inch incision of     skin, subcutaneous tissue, as well as nonviable scarring of the     anterior capsular tissue with a scalpel and a Bovie.  SURGEON:  Pietro Cassis. Alvan Dame, M.D.  ASSISTANT:  Danae Orleans, [facility] for the knee component and Nehemiah Massed, [facility] for the hip component.  ANESTHESIA:  Spinal.  SPECIMENS:  None.  COMPLICATION:  None.  BLOOD LOSS:  Noted to be about 600 mL.  INDICATIONS FOR PROCEDURE:  [name] is a 55 year old extremely active man, who had a history of bilateral total knee replaced about 7 or 8 years ago.  He has also had his left hip replaced.  In his  postoperative period from his left hip he has been challenged by hip flexion as well as pain with certain activities, particularly golfing and playing tennis with a lot of pulling sensation over the anterior aspect of the hip.  He is noted to have some degenerative changes in lumbar spine where he has worked a lot with Physical Therapy to improve his overall motion and strength with persistent pain that has limited his functional activities.  As of recently as well, in his knee, he has noted sharp pains with certain movements of his knee with a popping sensation, consistent with a patellar clunk.  We had a lengthy discussion as we had been following his knee and hip for some time.  He, at this point wished to proceed with more definitive measures.  I told him that the limitations of these procedure would be that he may not get relief and that we would be assuming at this point, the scar would be causing the problems in both knee and the hip.  After reviewing the risks of infection, risks of  a DVT, and most importantly stressing the recurrence of issues in both the knee and the hip, consent was obtained for benefit of pain relief.  PROCEDURE IN DETAIL:  The patient was brought to the operative theater. Once adequate anesthesia, preoperative antibiotics, Ancef administered, he was positioned supine.  We pre-draped out his perineum with 10/15 drapes.  The left lower extremity from the iliac crest to the ankle was prepped and draped in sterile fashion using impervious stockinette and Coban distally, as well as drapes into the perineum exposing the entire lower extremity.  His old incisions were identified and marked.  Charlie Pitter was used to wrap around the leg.  As was my intentional preoperative plan, attention was first directed to the knee.  His scar was excised sharply.  Soft tissue planes created and median arthrotomy was then made encountering clear synovial fluid.  However, there  was an appreciation of a significant scar within the knee.  He was noted to have a little bit of hyperextension, but was tight in flexion preoperatively.  Following initial exposure of the knee, I performed a synovectomy and scar to be in the medial and lateral aspect of the joint as well as suprapatellar.  I was then able to partially evert the patella with some peel of the proximal tibia.  I removed the excess amount of scar around the patella both inferior and superior as well as medially and lateral circumferentially.  This was taken back down to visible quadriceps and patellar tendon.  I then was able to remove some further soft tissues over the lateral side of the joint.  Following this initial exposure and procedure, I did remove the patient's old polyethylene, these have been in for 7 or 8 years, that plus there was a slight hyperextension, I wanted to exchange them out to 15 mm from 12.5.  The old polyethylene was removed by amputating the stump of the rotating platform knee.  At this point, I was able to have access to the posterior aspect of the knee.  Please note, around the patella, I did find an area of a probably 5-10 mm bone formation of the lateral facet region which was excised.  This could have been one of the sources of pain.  In addition, I was able to debride some bone off the medial and lateral aspects of the proximal tibia.  All potential sources of problems.  Once I was satisfied with the complete debridement of scar, as much as I could visually see at this point, we selected this 15 mm posterior stabilized insert to match the 4 femur.  It was then placed into the tibial tray.  We irrigated the knee with 1 L normal saline solution.  The knee was reduced.  The extensor mechanism was then reapproximated using #1 Vicryl and 0 V-Loc suture with the knee in flexion.  The remainder of the wound was closed with 2-0 Vicryl and 3-0 running Monocryl with the  knee in extension.  There was no tourniquet applied and none utilized during this left lower extremity procedure.  Please note at this time, while the knee was being closed at the skin level, I began the procedure on the left hip.  A second time-out was performed identifying the patient and this procedure.  The old incision was excised.  Soft tissue planes created down to the fascia of the tensor fascia lata muscle, which was then incised.  I elevated the scarred muscle from the fascia and then was able to  sweep the muscle laterally.  Utilizing Hibbs retractors, I was able to identify this muscle plane; however, there was noted to be significant scarring over this anterior aspect of the hip joint.  Once I was able to identify the landmarks of the superior aspect of the joint and inferior, cobra retractors were placed.  I then used a Bovie and began removing the scarred in anterior capsule.  This took quite a bit of time due to the extent of the scar in the anterior aspect of the joint from the superior, anterior aspect all the way to the medial aspect over the rim of the cup and then inferiorly toward the lesser trochanter.  Again, this was a vascular joint of the case at this point.  Once this was done, there was soft tissue present in the posterior aspect of the joint, but it was very supple and malleable.  I assessed the range of motion of the hip and found to have active hip flexion, internal-external rotation without limitations and nothing in front of the joint that would lead to impingement.  I felt at this point, that we had accomplished something that potentially would have some benefit for him down the road.  This hip wound was irrigated with normal saline solution with pulse lavage again with another 1 L.  I then reapproximated the fascia and the tensor fascia lata muscle using #1 Vicryl and 0 V-Loc suture.  The remainder of the wound was closed with 2-0 Vicryl running  Monocryl.  The hip and the knee wound were cleaned, dried, and dressed sterilely with surgical glue and Aquacel dressing.  He was then brought to the recovery room in stable condition, tolerating the procedure well.  Findings reviewed with his wife and then with him later.  He will be weightbearing as tolerated, work with Physical Therapy.  Keep him in the hospital for antibiotics overnight.  Discharge tomorrow.     Pietro Cassis Alvan Dame, M.D.   ______________________________ Pietro Cassis. Alvan Dame, M.D.    MDO/MEDQ  D:  08/03/2016  T:  08/04/2016  Job:  [id number]

## 2016-08-05 ENCOUNTER — Encounter (HOSPITAL_COMMUNITY): Payer: Self-pay | Admitting: Orthopedic Surgery

## 2016-08-05 NOTE — Anesthesia Postprocedure Evaluation (Signed)
Anesthesia Post Note  Patient: Harry Cobb  Procedure(s) Performed: Procedure(s) (LRB): Left knee open scar debridement, poly exchange  (Left) Left hip open scar debridement  (Left)  Patient location during evaluation: PACU Anesthesia Type: Spinal Level of consciousness: oriented and awake and alert Pain management: pain level controlled Vital Signs Assessment: post-procedure vital signs reviewed and stable Respiratory status: spontaneous breathing, respiratory function stable and patient connected to nasal cannula oxygen Cardiovascular status: blood pressure returned to baseline and stable Postop Assessment: no headache and no backache Anesthetic complications: no       Last Vitals:  Vitals:   08/04/16 0605 08/04/16 0946  BP: 109/64 106/71  Pulse: (!) 55 71  Resp: 17 17  Temp: 36.8 C 36.6 C    Last Pain:  Vitals:   08/04/16 1115  TempSrc:   PainSc: 2                  Onell Mcmath DAVID

## 2016-08-09 NOTE — Discharge Summary (Signed)
Physician Discharge Summary  Patient ID: Harry Cobb MRN: [id number] DOB/AGE: 07-18-1960 56 y.o.  Admit date: 08/03/2016 Discharge date: 08/04/2016   Procedures:  Procedure(s) (LRB): Left knee open scar debridement, poly exchange  (Left) Left hip open scar debridement  (Left)  Attending Physician:  Dr. Paralee Cancel   Admission Diagnoses:   Left hip and knee stiffness and pain  Discharge Diagnoses:  Active Problems:   Knee stiffness, left  Past Medical History:  Diagnosis Date  . Arthritis   . No pertinent past medical history     HPI:    Pt is a 56 y.o. male complaining of left hip and knee  pain for  years. Pain had continually increased since the beginning. X-rays in the clinic show previous THA and TKA in good position and alignment. Pt has tried various conservative treatments which have failed to alleviate their symptoms. Various options are discussed with the patient. Risks, benefits and expectations were discussed with the patient. Patient understand the risks, benefits and expectations and wishes to proceed with surgery.   PCP: Antony Blackbird, MD   Discharged Condition: good  Hospital Course:  Patient underwent the above stated procedure on 08/03/2016. Patient tolerated the procedure well and brought to the recovery room in good condition and subsequently to the floor.  POD #1 BP: 109/64 ; Pulse: 55 ; Temp: 98.3 F (36.8 C) ; Resp: 17 Patient reports pain as mild, pain controlled. No events throughout the night. Feels better today as compared to yesterday.  We discussed the findings and surgery performed. Discussed the recovery process and expectations.  Ready to be discharged home. Dorsiflexion/plantar flexion intact, incision: dressing C/D/I, no cellulitis present and compartment soft.   LABS  Basename    HGB     12.3  HCT     35.2    Discharge Exam: General appearance: alert, cooperative and no distress Extremities: Homans sign is negative, no sign of DVT,  no edema, redness or tenderness in the calves or thighs and no ulcers, gangrene or trophic changes  Disposition: Home with follow up in 2 weeks   Follow-up Information    Mauri Pole, MD. Schedule an appointment as soon as possible for a visit in 2 week(s).   Specialty:  Orthopedic Surgery Contact information: [address] [place] [address] [phone]           Discharge Instructions    Call MD / Call 911    Complete by:  As directed    If you experience chest pain or shortness of breath, CALL 911 and be transported to the hospital emergency room.  If you develope a fever above 101 F, pus (white drainage) or increased drainage or redness at the wound, or calf pain, call your surgeon's office.   Change dressing    Complete by:  As directed    Maintain surgical dressing until follow up in the clinic. If the edges start to pull up, may reinforce with tape. If the dressing is no longer working, may remove and cover with gauze and tape, but must keep the area dry and clean.  Call with any questions or concerns.   Change dressing    Complete by:  As directed    Maintain surgical dressing until follow up in the clinic. If the edges start to pull up, may reinforce with tape. If the dressing is no longer working, may remove and cover with gauze and tape, but must keep the area dry and  clean.  Call with any questions or concerns.   Constipation Prevention    Complete by:  As directed    Drink plenty of fluids.  Prune juice may be helpful.  You may use a stool softener, such as Colace (over the counter) 100 mg twice a day.  Use MiraLax (over the counter) for constipation as needed.   Diet - low sodium heart healthy    Complete by:  As directed    Discharge instructions    Complete by:  As directed    Maintain surgical dressing until follow up in the clinic. If the edges start to pull up, may reinforce with tape. If the dressing is no longer working, may remove and  cover with gauze and tape, but must keep the area dry and clean.  Follow up in 2 weeks at [facility]. Call with any questions or concerns.   Increase activity slowly as tolerated    Complete by:  As directed    Weight bearing as tolerated with assist device (walker, cane, etc) as directed, use it as long as suggested by your surgeon or therapist, typically at least 4-6 weeks.   TED hose    Complete by:  As directed    Use stockings (TED hose) for 2 weeks on both leg(s).  You may remove them at night for sleeping.   TED hose    Complete by:  As directed    Use stockings (TED hose) for 2 weeks on both leg(s).  You may remove them at night for sleeping.      Allergies as of 08/04/2016   No Known Allergies     Medication List    STOP taking these medications   naproxen 500 MG tablet Commonly known as:  NAPROSYN     TAKE these medications   aspirin 81 MG chewable tablet Chew 1 tablet (81 mg total) by mouth 2 (two) times daily. Take for 4 weeks.   azelastine 0.1 % nasal spray Commonly known as:  ASTELIN Place 2 sprays into both nostrils daily.   celecoxib 200 MG capsule Commonly known as:  CELEBREX Take 1 capsule (200 mg total) by mouth every 12 (twelve) hours.   docusate sodium 100 MG capsule Commonly known as:  COLACE Take 1 capsule (100 mg total) by mouth 2 (two) times daily.   ferrous sulfate 325 (65 FE) MG tablet Take 1 tablet (325 mg total) by mouth 3 (three) times daily after meals.   fexofenadine 180 MG tablet Commonly known as:  ALLEGRA Take 180 mg by mouth daily.   HYDROcodone-acetaminophen 7.5-325 MG tablet Commonly known as:  NORCO Take 1-2 tablets by mouth every 4 (four) hours as needed for moderate pain.   ipratropium 0.06 % nasal spray Commonly known as:  ATROVENT Place 2 sprays into both nostrils daily.   methocarbamol 500 MG tablet Commonly known as:  ROBAXIN Take 1 tablet (500 mg total) by mouth every 6 (six) hours as needed for muscle  spasms.   polyethylene glycol packet Commonly known as:  MIRALAX / GLYCOLAX Take 17 g by mouth 2 (two) times daily.        Signed: West Pugh. Acey Woodfield   PA-C  08/09/2016, 10:00 AM

## 2017-01-12 DIAGNOSIS — M9905 Segmental and somatic dysfunction of pelvic region: Secondary | ICD-10-CM | POA: Diagnosis not present

## 2017-01-12 DIAGNOSIS — M9903 Segmental and somatic dysfunction of lumbar region: Secondary | ICD-10-CM | POA: Diagnosis not present

## 2017-01-12 DIAGNOSIS — M7661 Achilles tendinitis, right leg: Secondary | ICD-10-CM | POA: Diagnosis not present

## 2017-01-12 DIAGNOSIS — M9904 Segmental and somatic dysfunction of sacral region: Secondary | ICD-10-CM | POA: Diagnosis not present

## 2017-01-12 DIAGNOSIS — M5127 Other intervertebral disc displacement, lumbosacral region: Secondary | ICD-10-CM | POA: Diagnosis not present

## 2017-01-12 DIAGNOSIS — M7662 Achilles tendinitis, left leg: Secondary | ICD-10-CM | POA: Diagnosis not present

## 2017-01-19 DIAGNOSIS — M5127 Other intervertebral disc displacement, lumbosacral region: Secondary | ICD-10-CM | POA: Diagnosis not present

## 2017-01-19 DIAGNOSIS — M9905 Segmental and somatic dysfunction of pelvic region: Secondary | ICD-10-CM | POA: Diagnosis not present

## 2017-01-19 DIAGNOSIS — M9904 Segmental and somatic dysfunction of sacral region: Secondary | ICD-10-CM | POA: Diagnosis not present

## 2017-01-19 DIAGNOSIS — M9903 Segmental and somatic dysfunction of lumbar region: Secondary | ICD-10-CM | POA: Diagnosis not present

## 2017-01-21 DIAGNOSIS — M9904 Segmental and somatic dysfunction of sacral region: Secondary | ICD-10-CM | POA: Diagnosis not present

## 2017-01-21 DIAGNOSIS — M9903 Segmental and somatic dysfunction of lumbar region: Secondary | ICD-10-CM | POA: Diagnosis not present

## 2017-01-21 DIAGNOSIS — R351 Nocturia: Secondary | ICD-10-CM | POA: Diagnosis not present

## 2017-01-21 DIAGNOSIS — M5127 Other intervertebral disc displacement, lumbosacral region: Secondary | ICD-10-CM | POA: Diagnosis not present

## 2017-01-21 DIAGNOSIS — M9905 Segmental and somatic dysfunction of pelvic region: Secondary | ICD-10-CM | POA: Diagnosis not present

## 2017-01-24 DIAGNOSIS — M7662 Achilles tendinitis, left leg: Secondary | ICD-10-CM | POA: Diagnosis not present

## 2017-01-24 DIAGNOSIS — M7661 Achilles tendinitis, right leg: Secondary | ICD-10-CM | POA: Diagnosis not present

## 2017-02-01 DIAGNOSIS — M5127 Other intervertebral disc displacement, lumbosacral region: Secondary | ICD-10-CM | POA: Diagnosis not present

## 2017-02-01 DIAGNOSIS — M9904 Segmental and somatic dysfunction of sacral region: Secondary | ICD-10-CM | POA: Diagnosis not present

## 2017-02-01 DIAGNOSIS — M9903 Segmental and somatic dysfunction of lumbar region: Secondary | ICD-10-CM | POA: Diagnosis not present

## 2017-02-01 DIAGNOSIS — M9905 Segmental and somatic dysfunction of pelvic region: Secondary | ICD-10-CM | POA: Diagnosis not present

## 2017-02-08 DIAGNOSIS — M9903 Segmental and somatic dysfunction of lumbar region: Secondary | ICD-10-CM | POA: Diagnosis not present

## 2017-02-08 DIAGNOSIS — M5127 Other intervertebral disc displacement, lumbosacral region: Secondary | ICD-10-CM | POA: Diagnosis not present

## 2017-02-08 DIAGNOSIS — M9904 Segmental and somatic dysfunction of sacral region: Secondary | ICD-10-CM | POA: Diagnosis not present

## 2017-02-08 DIAGNOSIS — M9905 Segmental and somatic dysfunction of pelvic region: Secondary | ICD-10-CM | POA: Diagnosis not present

## 2017-02-16 DIAGNOSIS — S76012A Strain of muscle, fascia and tendon of left hip, initial encounter: Secondary | ICD-10-CM | POA: Diagnosis not present

## 2017-02-16 DIAGNOSIS — M533 Sacrococcygeal disorders, not elsewhere classified: Secondary | ICD-10-CM | POA: Diagnosis not present

## 2017-02-16 DIAGNOSIS — M5137 Other intervertebral disc degeneration, lumbosacral region: Secondary | ICD-10-CM | POA: Diagnosis not present

## 2017-02-16 DIAGNOSIS — M5136 Other intervertebral disc degeneration, lumbar region: Secondary | ICD-10-CM | POA: Diagnosis not present

## 2017-03-24 DIAGNOSIS — M961 Postlaminectomy syndrome, not elsewhere classified: Secondary | ICD-10-CM | POA: Diagnosis not present

## 2017-03-24 DIAGNOSIS — M5416 Radiculopathy, lumbar region: Secondary | ICD-10-CM | POA: Diagnosis not present

## 2017-04-01 DIAGNOSIS — M5137 Other intervertebral disc degeneration, lumbosacral region: Secondary | ICD-10-CM | POA: Diagnosis not present

## 2017-04-05 DIAGNOSIS — M5416 Radiculopathy, lumbar region: Secondary | ICD-10-CM | POA: Diagnosis not present

## 2017-04-05 DIAGNOSIS — M5137 Other intervertebral disc degeneration, lumbosacral region: Secondary | ICD-10-CM | POA: Diagnosis not present

## 2017-04-07 ENCOUNTER — Other Ambulatory Visit: Payer: Self-pay | Admitting: Orthopedic Surgery

## 2017-04-07 DIAGNOSIS — M5136 Other intervertebral disc degeneration, lumbar region: Secondary | ICD-10-CM

## 2017-04-11 DIAGNOSIS — J029 Acute pharyngitis, unspecified: Secondary | ICD-10-CM | POA: Diagnosis not present

## 2017-04-22 ENCOUNTER — Ambulatory Visit
Admission: RE | Admit: 2017-04-22 | Discharge: 2017-04-22 | Disposition: A | Discharge status: Home / Self Care | Payer: BLUE CROSS/BLUE SHIELD | Source: Ambulatory Visit | Attending: Orthopedic Surgery | Admitting: Orthopedic Surgery

## 2017-04-22 DIAGNOSIS — M5136 Other intervertebral disc degeneration, lumbar region: Secondary | ICD-10-CM

## 2017-04-22 DIAGNOSIS — L298 Other pruritus: Secondary | ICD-10-CM | POA: Diagnosis not present

## 2017-04-22 DIAGNOSIS — L720 Epidermal cyst: Secondary | ICD-10-CM | POA: Diagnosis not present

## 2017-04-22 DIAGNOSIS — M47816 Spondylosis without myelopathy or radiculopathy, lumbar region: Secondary | ICD-10-CM | POA: Diagnosis not present

## 2017-04-22 DIAGNOSIS — L853 Xerosis cutis: Secondary | ICD-10-CM | POA: Diagnosis not present

## 2017-05-02 DIAGNOSIS — Z6829 Body mass index (BMI) 29.0-29.9, adult: Secondary | ICD-10-CM | POA: Diagnosis not present

## 2017-05-02 DIAGNOSIS — M961 Postlaminectomy syndrome, not elsewhere classified: Secondary | ICD-10-CM | POA: Diagnosis not present

## 2017-05-03 DIAGNOSIS — M545 Low back pain: Secondary | ICD-10-CM | POA: Diagnosis not present

## 2017-05-03 DIAGNOSIS — M533 Sacrococcygeal disorders, not elsewhere classified: Secondary | ICD-10-CM | POA: Diagnosis not present

## 2017-05-03 DIAGNOSIS — M2550 Pain in unspecified joint: Secondary | ICD-10-CM | POA: Diagnosis not present

## 2017-05-03 DIAGNOSIS — M150 Primary generalized (osteo)arthritis: Secondary | ICD-10-CM | POA: Diagnosis not present

## 2017-05-10 DIAGNOSIS — M545 Low back pain: Secondary | ICD-10-CM | POA: Diagnosis not present

## 2017-05-16 DIAGNOSIS — H00021 Hordeolum internum right upper eyelid: Secondary | ICD-10-CM | POA: Diagnosis not present

## 2017-05-16 DIAGNOSIS — H00024 Hordeolum internum left upper eyelid: Secondary | ICD-10-CM | POA: Diagnosis not present

## 2017-05-17 DIAGNOSIS — M9904 Segmental and somatic dysfunction of sacral region: Secondary | ICD-10-CM | POA: Diagnosis not present

## 2017-05-17 DIAGNOSIS — M9903 Segmental and somatic dysfunction of lumbar region: Secondary | ICD-10-CM | POA: Diagnosis not present

## 2017-05-17 DIAGNOSIS — M9905 Segmental and somatic dysfunction of pelvic region: Secondary | ICD-10-CM | POA: Diagnosis not present

## 2017-05-17 DIAGNOSIS — M5127 Other intervertebral disc displacement, lumbosacral region: Secondary | ICD-10-CM | POA: Diagnosis not present

## 2017-05-19 DIAGNOSIS — H00024 Hordeolum internum left upper eyelid: Secondary | ICD-10-CM | POA: Diagnosis not present

## 2017-05-19 DIAGNOSIS — H00021 Hordeolum internum right upper eyelid: Secondary | ICD-10-CM | POA: Diagnosis not present

## 2017-05-20 DIAGNOSIS — M9905 Segmental and somatic dysfunction of pelvic region: Secondary | ICD-10-CM | POA: Diagnosis not present

## 2017-05-20 DIAGNOSIS — M9903 Segmental and somatic dysfunction of lumbar region: Secondary | ICD-10-CM | POA: Diagnosis not present

## 2017-05-20 DIAGNOSIS — M9904 Segmental and somatic dysfunction of sacral region: Secondary | ICD-10-CM | POA: Diagnosis not present

## 2017-05-20 DIAGNOSIS — M5127 Other intervertebral disc displacement, lumbosacral region: Secondary | ICD-10-CM | POA: Diagnosis not present

## 2017-06-27 DIAGNOSIS — M545 Low back pain: Secondary | ICD-10-CM | POA: Diagnosis not present

## 2017-06-27 DIAGNOSIS — R202 Paresthesia of skin: Secondary | ICD-10-CM | POA: Diagnosis not present

## 2017-06-27 DIAGNOSIS — R292 Abnormal reflex: Secondary | ICD-10-CM | POA: Diagnosis not present

## 2017-06-27 DIAGNOSIS — Z01818 Encounter for other preprocedural examination: Secondary | ICD-10-CM | POA: Diagnosis not present

## 2017-06-27 DIAGNOSIS — M47816 Spondylosis without myelopathy or radiculopathy, lumbar region: Secondary | ICD-10-CM | POA: Diagnosis not present

## 2017-06-27 DIAGNOSIS — Z01812 Encounter for preprocedural laboratory examination: Secondary | ICD-10-CM | POA: Diagnosis not present

## 2017-06-27 DIAGNOSIS — M5416 Radiculopathy, lumbar region: Secondary | ICD-10-CM | POA: Diagnosis not present

## 2017-06-28 DIAGNOSIS — M48061 Spinal stenosis, lumbar region without neurogenic claudication: Secondary | ICD-10-CM | POA: Diagnosis not present

## 2017-06-28 DIAGNOSIS — M961 Postlaminectomy syndrome, not elsewhere classified: Secondary | ICD-10-CM | POA: Diagnosis not present

## 2017-06-28 DIAGNOSIS — M5126 Other intervertebral disc displacement, lumbar region: Secondary | ICD-10-CM | POA: Diagnosis not present

## 2017-06-28 DIAGNOSIS — M4807 Spinal stenosis, lumbosacral region: Secondary | ICD-10-CM | POA: Diagnosis not present

## 2017-06-30 DIAGNOSIS — M961 Postlaminectomy syndrome, not elsewhere classified: Secondary | ICD-10-CM | POA: Diagnosis not present

## 2017-06-30 DIAGNOSIS — M5127 Other intervertebral disc displacement, lumbosacral region: Secondary | ICD-10-CM | POA: Diagnosis not present

## 2017-06-30 DIAGNOSIS — M4807 Spinal stenosis, lumbosacral region: Secondary | ICD-10-CM | POA: Diagnosis not present

## 2017-07-01 DIAGNOSIS — M5416 Radiculopathy, lumbar region: Secondary | ICD-10-CM | POA: Diagnosis not present

## 2017-07-06 DIAGNOSIS — L299 Pruritus, unspecified: Secondary | ICD-10-CM | POA: Diagnosis not present

## 2017-07-06 DIAGNOSIS — J3089 Other allergic rhinitis: Secondary | ICD-10-CM | POA: Diagnosis not present

## 2017-07-06 DIAGNOSIS — B999 Unspecified infectious disease: Secondary | ICD-10-CM | POA: Diagnosis not present

## 2017-07-06 DIAGNOSIS — J301 Allergic rhinitis due to pollen: Secondary | ICD-10-CM | POA: Diagnosis not present

## 2017-07-14 DIAGNOSIS — N400 Enlarged prostate without lower urinary tract symptoms: Secondary | ICD-10-CM | POA: Diagnosis not present

## 2017-07-20 ENCOUNTER — Telehealth: Payer: Self-pay | Admitting: Vascular Surgery

## 2017-07-20 NOTE — Telephone Encounter (Signed)
-----   Message from Willy Eddy, RN sent at 07/20/2017  3:49 PM EST ----- Regarding: OFFICE APPT FOR ALIF  Patient need office appointment with Dr. Donnetta Hutching / ALIF surgery date is 08/19/17. Please call patient. Thanks B

## 2017-07-20 NOTE — Telephone Encounter (Signed)
Sched appt 08/16/17 at 3:45. Called pt to inform them of appt, pt said "I'm not having spine surgery" and hung up.  Pt then called our office back and spoke to [place], telling her that he had already had the ALIF with a different office and to cancel the appt here.

## 2017-08-16 ENCOUNTER — Encounter: Payer: BLUE CROSS/BLUE SHIELD | Admitting: Vascular Surgery

## 2017-08-19 ENCOUNTER — Inpatient Hospital Stay: Admit: 2017-08-19 | Payer: BLUE CROSS/BLUE SHIELD | Admitting: Neurological Surgery

## 2017-08-19 SURGERY — ANTERIOR LUMBAR FUSION 1 LEVEL
Anesthesia: General

## 2017-08-22 DIAGNOSIS — M545 Low back pain: Secondary | ICD-10-CM | POA: Diagnosis not present

## 2017-08-24 DIAGNOSIS — M545 Low back pain, unspecified: Secondary | ICD-10-CM | POA: Insufficient documentation

## 2017-08-31 DIAGNOSIS — M545 Low back pain: Secondary | ICD-10-CM | POA: Diagnosis not present

## 2017-09-08 DIAGNOSIS — S86012A Strain of left Achilles tendon, initial encounter: Secondary | ICD-10-CM | POA: Diagnosis not present

## 2017-09-08 DIAGNOSIS — M545 Low back pain: Secondary | ICD-10-CM | POA: Diagnosis not present

## 2017-09-22 DIAGNOSIS — M545 Low back pain: Secondary | ICD-10-CM | POA: Diagnosis not present

## 2017-10-06 DIAGNOSIS — M9905 Segmental and somatic dysfunction of pelvic region: Secondary | ICD-10-CM | POA: Diagnosis not present

## 2017-10-06 DIAGNOSIS — M9903 Segmental and somatic dysfunction of lumbar region: Secondary | ICD-10-CM | POA: Diagnosis not present

## 2017-10-06 DIAGNOSIS — M5127 Other intervertebral disc displacement, lumbosacral region: Secondary | ICD-10-CM | POA: Diagnosis not present

## 2017-10-06 DIAGNOSIS — M9904 Segmental and somatic dysfunction of sacral region: Secondary | ICD-10-CM | POA: Diagnosis not present

## 2017-11-07 ENCOUNTER — Ambulatory Visit: Payer: Self-pay | Admitting: Family Medicine

## 2017-11-07 VITALS — BP 122/78 | HR 59 | Temp 98.5°F | Resp 18 | Wt 203.8 lb

## 2017-11-07 DIAGNOSIS — R059 Cough, unspecified: Secondary | ICD-10-CM

## 2017-11-07 DIAGNOSIS — R05 Cough: Secondary | ICD-10-CM

## 2017-11-07 DIAGNOSIS — J309 Allergic rhinitis, unspecified: Secondary | ICD-10-CM

## 2017-11-07 MED ORDER — PREDNISONE 20 MG PO TABS: 40.0000 mg | ORAL_TABLET | Freq: Every day | ORAL | 0 refills | Status: AC

## 2017-11-07 MED ORDER — BENZONATATE 100 MG PO CAPS: 100.0000 mg | ORAL_CAPSULE | Freq: Three times a day (TID) | ORAL | 0 refills | Status: DC | PRN

## 2017-11-07 MED ORDER — MONTELUKAST SODIUM 10 MG PO TABS: 10.0000 mg | ORAL_TABLET | Freq: Every day | ORAL | 3 refills | Status: DC

## 2017-11-07 MED ORDER — FLUTICASONE PROPIONATE 50 MCG/ACT NA SUSP: 2.0000 | Freq: Every day | NASAL | 0 refills | Status: DC

## 2017-11-07 NOTE — Patient Instructions (Signed)

## 2017-11-07 NOTE — Progress Notes (Signed)
Harry Cobb is a 57 y.o. male who presents today with concerns of cough that has began in the last week and progressed to keeping him up at night for the  Last three nights. He reports having seasonal allergies and using 2 inhalers daily and Allegra for symptoms. He denies recent sinus infections or otherwise and is concerned because he is doing out of town next week.  Review of Systems  Constitutional: Negative for chills, fever and malaise/fatigue.  HENT: Positive for congestion. Negative for ear discharge, ear pain, sinus pain and sore throat.   Eyes: Negative.   Respiratory: Positive for cough. Negative for sputum production and shortness of breath.   Cardiovascular: Negative.  Negative for chest pain.  Gastrointestinal: Negative for abdominal pain, diarrhea, nausea and vomiting.  Genitourinary: Negative for dysuria, frequency, hematuria and urgency.  Musculoskeletal: Negative for myalgias.  Skin: Negative.   Neurological: Negative for headaches.  Endo/Heme/Allergies: Negative.   Psychiatric/Behavioral: Negative.     O: Vitals:   11/07/17 0933  BP: 122/78  Pulse: (!) 59  Resp: 18  Temp: 98.5 F (36.9 C)  SpO2: 96%     Physical Exam  Constitutional: He is oriented to person, place, and time. Vital signs are normal. He appears well-developed and well-nourished. He is active.  Non-toxic appearance. He does not have a sickly appearance.  HENT:  Head: Normocephalic.  Right Ear: Hearing, tympanic membrane, external ear and ear canal normal.  Left Ear: Hearing, tympanic membrane, external ear and ear canal normal.  Nose: Nose normal.  Mouth/Throat: Uvula is midline and oropharynx is clear and moist.  Neck: Normal range of motion. Neck supple.  Cardiovascular: Normal rate, regular rhythm, normal heart sounds and normal pulses.  Pulmonary/Chest: Effort normal. He has rales in the right upper field, the right lower field, the left upper field and the left lower field.  Abdominal:  Soft. Bowel sounds are normal.  Musculoskeletal: Normal range of motion.  Lymphadenopathy:       Head (right side): No submental and no submandibular adenopathy present.       Head (left side): No submental and no submandibular adenopathy present.    He has no cervical adenopathy.  Neurological: He is alert and oriented to person, place, and time.  Psychiatric: He has a normal mood and affect.  Vitals reviewed.    A: 1. Cough   2. Allergic rhinitis, unspecified seasonality, unspecified trigger      P: Each medication explained to patient- advised to f/u if symptoms not improved. Exam findings, diagnosis etiology and medication use and indications reviewed with patient. Follow- Up and discharge instructions provided. No emergent/urgent issues found on exam.  Patient verbalized understanding of information provided and agrees with plan of care (POC), all questions answered.  1. Cough  2. Allergic rhinitis, unspecified seasonality, unspecified trigger  Other orders - fluticasone (FLONASE) 50 MCG/ACT nasal spray; Place 2 sprays into both nostrils daily. - montelukast (SINGULAIR) 10 MG tablet; Take 1 tablet (10 mg total) by mouth at bedtime. - benzonatate (TESSALON) 100 MG capsule; Take 1-2 capsules (100-200 mg total) by mouth 3 (three) times daily as needed for cough (with full glass of water). - predniSONE (DELTASONE) 20 MG tablet; Take 2 tablets (40 mg total) by mouth daily with breakfast for 5 days.

## 2017-11-09 ENCOUNTER — Telehealth: Payer: Self-pay

## 2017-11-09 NOTE — Telephone Encounter (Signed)
Patient states he feels better

## 2018-03-23 DIAGNOSIS — M9903 Segmental and somatic dysfunction of lumbar region: Secondary | ICD-10-CM | POA: Diagnosis not present

## 2018-03-23 DIAGNOSIS — M9904 Segmental and somatic dysfunction of sacral region: Secondary | ICD-10-CM | POA: Diagnosis not present

## 2018-03-23 DIAGNOSIS — M5127 Other intervertebral disc displacement, lumbosacral region: Secondary | ICD-10-CM | POA: Diagnosis not present

## 2018-03-27 DIAGNOSIS — M9903 Segmental and somatic dysfunction of lumbar region: Secondary | ICD-10-CM | POA: Diagnosis not present

## 2018-03-27 DIAGNOSIS — M9905 Segmental and somatic dysfunction of pelvic region: Secondary | ICD-10-CM | POA: Diagnosis not present

## 2018-03-27 DIAGNOSIS — M5127 Other intervertebral disc displacement, lumbosacral region: Secondary | ICD-10-CM | POA: Diagnosis not present

## 2018-03-29 DIAGNOSIS — R1111 Vomiting without nausea: Secondary | ICD-10-CM | POA: Diagnosis not present

## 2018-03-29 DIAGNOSIS — N201 Calculus of ureter: Secondary | ICD-10-CM | POA: Diagnosis not present

## 2018-03-30 DIAGNOSIS — M25519 Pain in unspecified shoulder: Secondary | ICD-10-CM | POA: Diagnosis not present

## 2018-03-30 DIAGNOSIS — M7552 Bursitis of left shoulder: Secondary | ICD-10-CM | POA: Diagnosis not present

## 2018-03-30 DIAGNOSIS — M25512 Pain in left shoulder: Secondary | ICD-10-CM | POA: Diagnosis not present

## 2018-04-26 DIAGNOSIS — M25512 Pain in left shoulder: Secondary | ICD-10-CM | POA: Insufficient documentation

## 2018-05-06 DIAGNOSIS — M25512 Pain in left shoulder: Secondary | ICD-10-CM | POA: Diagnosis not present

## 2018-05-15 DIAGNOSIS — M25512 Pain in left shoulder: Secondary | ICD-10-CM | POA: Diagnosis not present

## 2018-05-15 DIAGNOSIS — M19012 Primary osteoarthritis, left shoulder: Secondary | ICD-10-CM | POA: Diagnosis not present

## 2018-05-23 DIAGNOSIS — Z1389 Encounter for screening for other disorder: Secondary | ICD-10-CM | POA: Diagnosis not present

## 2018-05-23 DIAGNOSIS — M1990 Unspecified osteoarthritis, unspecified site: Secondary | ICD-10-CM | POA: Diagnosis not present

## 2018-05-23 DIAGNOSIS — Z0000 Encounter for general adult medical examination without abnormal findings: Secondary | ICD-10-CM | POA: Diagnosis not present

## 2018-05-23 DIAGNOSIS — J302 Other seasonal allergic rhinitis: Secondary | ICD-10-CM | POA: Diagnosis not present

## 2018-05-23 DIAGNOSIS — Z23 Encounter for immunization: Secondary | ICD-10-CM | POA: Diagnosis not present

## 2018-06-16 DIAGNOSIS — M25512 Pain in left shoulder: Secondary | ICD-10-CM | POA: Diagnosis not present

## 2018-06-22 DIAGNOSIS — M25519 Pain in unspecified shoulder: Secondary | ICD-10-CM | POA: Diagnosis not present

## 2018-06-22 DIAGNOSIS — M1990 Unspecified osteoarthritis, unspecified site: Secondary | ICD-10-CM | POA: Diagnosis not present

## 2018-06-23 DIAGNOSIS — M7542 Impingement syndrome of left shoulder: Secondary | ICD-10-CM | POA: Insufficient documentation

## 2018-06-27 DIAGNOSIS — M958 Other specified acquired deformities of musculoskeletal system: Secondary | ICD-10-CM | POA: Diagnosis not present

## 2018-06-27 DIAGNOSIS — S43432A Superior glenoid labrum lesion of left shoulder, initial encounter: Secondary | ICD-10-CM | POA: Diagnosis not present

## 2018-06-27 DIAGNOSIS — X58XXXA Exposure to other specified factors, initial encounter: Secondary | ICD-10-CM | POA: Diagnosis not present

## 2018-06-27 DIAGNOSIS — M7522 Bicipital tendinitis, left shoulder: Secondary | ICD-10-CM | POA: Diagnosis not present

## 2018-06-27 DIAGNOSIS — M24012 Loose body in left shoulder: Secondary | ICD-10-CM | POA: Diagnosis not present

## 2018-06-27 DIAGNOSIS — R600 Localized edema: Secondary | ICD-10-CM | POA: Diagnosis not present

## 2018-06-27 DIAGNOSIS — Z4889 Encounter for other specified surgical aftercare: Secondary | ICD-10-CM | POA: Diagnosis not present

## 2018-06-27 DIAGNOSIS — M94212 Chondromalacia, left shoulder: Secondary | ICD-10-CM | POA: Diagnosis not present

## 2018-06-27 DIAGNOSIS — M25512 Pain in left shoulder: Secondary | ICD-10-CM | POA: Diagnosis not present

## 2018-06-27 DIAGNOSIS — M7542 Impingement syndrome of left shoulder: Secondary | ICD-10-CM | POA: Diagnosis not present

## 2018-06-27 DIAGNOSIS — S43492D Other sprain of left shoulder joint, subsequent encounter: Secondary | ICD-10-CM | POA: Diagnosis not present

## 2018-06-27 DIAGNOSIS — G8918 Other acute postprocedural pain: Secondary | ICD-10-CM | POA: Diagnosis not present

## 2018-06-27 DIAGNOSIS — Y999 Unspecified external cause status: Secondary | ICD-10-CM | POA: Diagnosis not present

## 2018-06-27 DIAGNOSIS — M75112 Incomplete rotator cuff tear or rupture of left shoulder, not specified as traumatic: Secondary | ICD-10-CM | POA: Diagnosis not present

## 2018-06-28 DIAGNOSIS — Z4889 Encounter for other specified surgical aftercare: Secondary | ICD-10-CM | POA: Diagnosis not present

## 2018-06-28 DIAGNOSIS — M7542 Impingement syndrome of left shoulder: Secondary | ICD-10-CM | POA: Diagnosis not present

## 2018-06-28 DIAGNOSIS — R600 Localized edema: Secondary | ICD-10-CM | POA: Diagnosis not present

## 2018-06-29 DIAGNOSIS — R600 Localized edema: Secondary | ICD-10-CM | POA: Diagnosis not present

## 2018-06-29 DIAGNOSIS — Z4889 Encounter for other specified surgical aftercare: Secondary | ICD-10-CM | POA: Diagnosis not present

## 2018-06-29 DIAGNOSIS — M7542 Impingement syndrome of left shoulder: Secondary | ICD-10-CM | POA: Diagnosis not present

## 2018-06-30 DIAGNOSIS — Z4889 Encounter for other specified surgical aftercare: Secondary | ICD-10-CM | POA: Diagnosis not present

## 2018-06-30 DIAGNOSIS — R600 Localized edema: Secondary | ICD-10-CM | POA: Diagnosis not present

## 2018-06-30 DIAGNOSIS — M7542 Impingement syndrome of left shoulder: Secondary | ICD-10-CM | POA: Diagnosis not present

## 2018-07-01 DIAGNOSIS — R600 Localized edema: Secondary | ICD-10-CM | POA: Diagnosis not present

## 2018-07-01 DIAGNOSIS — Z4889 Encounter for other specified surgical aftercare: Secondary | ICD-10-CM | POA: Diagnosis not present

## 2018-07-01 DIAGNOSIS — M7542 Impingement syndrome of left shoulder: Secondary | ICD-10-CM | POA: Diagnosis not present

## 2018-07-02 DIAGNOSIS — M7542 Impingement syndrome of left shoulder: Secondary | ICD-10-CM | POA: Diagnosis not present

## 2018-07-02 DIAGNOSIS — R600 Localized edema: Secondary | ICD-10-CM | POA: Diagnosis not present

## 2018-07-02 DIAGNOSIS — Z4889 Encounter for other specified surgical aftercare: Secondary | ICD-10-CM | POA: Diagnosis not present

## 2018-07-03 DIAGNOSIS — R600 Localized edema: Secondary | ICD-10-CM | POA: Diagnosis not present

## 2018-07-03 DIAGNOSIS — M7542 Impingement syndrome of left shoulder: Secondary | ICD-10-CM | POA: Diagnosis not present

## 2018-07-03 DIAGNOSIS — Z4889 Encounter for other specified surgical aftercare: Secondary | ICD-10-CM | POA: Diagnosis not present

## 2018-07-04 DIAGNOSIS — Z4889 Encounter for other specified surgical aftercare: Secondary | ICD-10-CM | POA: Diagnosis not present

## 2018-07-04 DIAGNOSIS — R600 Localized edema: Secondary | ICD-10-CM | POA: Diagnosis not present

## 2018-07-04 DIAGNOSIS — M7542 Impingement syndrome of left shoulder: Secondary | ICD-10-CM | POA: Diagnosis not present

## 2018-07-07 DIAGNOSIS — M25512 Pain in left shoulder: Secondary | ICD-10-CM | POA: Diagnosis not present

## 2018-07-17 DIAGNOSIS — J3089 Other allergic rhinitis: Secondary | ICD-10-CM | POA: Diagnosis not present

## 2018-07-17 DIAGNOSIS — B999 Unspecified infectious disease: Secondary | ICD-10-CM | POA: Diagnosis not present

## 2018-07-17 DIAGNOSIS — J301 Allergic rhinitis due to pollen: Secondary | ICD-10-CM | POA: Diagnosis not present

## 2018-07-17 DIAGNOSIS — L299 Pruritus, unspecified: Secondary | ICD-10-CM | POA: Diagnosis not present

## 2018-08-08 DIAGNOSIS — M25512 Pain in left shoulder: Secondary | ICD-10-CM | POA: Diagnosis not present

## 2018-08-16 DIAGNOSIS — M25512 Pain in left shoulder: Secondary | ICD-10-CM | POA: Diagnosis not present

## 2018-08-21 DIAGNOSIS — M25512 Pain in left shoulder: Secondary | ICD-10-CM | POA: Diagnosis not present

## 2018-08-24 DIAGNOSIS — M25512 Pain in left shoulder: Secondary | ICD-10-CM | POA: Diagnosis not present

## 2018-08-30 DIAGNOSIS — B078 Other viral warts: Secondary | ICD-10-CM | POA: Diagnosis not present

## 2018-08-30 DIAGNOSIS — L720 Epidermal cyst: Secondary | ICD-10-CM | POA: Diagnosis not present

## 2018-09-20 DIAGNOSIS — M79672 Pain in left foot: Secondary | ICD-10-CM | POA: Diagnosis not present

## 2018-09-20 DIAGNOSIS — M722 Plantar fascial fibromatosis: Secondary | ICD-10-CM | POA: Diagnosis not present

## 2018-10-05 ENCOUNTER — Other Ambulatory Visit: Payer: Self-pay

## 2018-10-05 ENCOUNTER — Other Ambulatory Visit: Payer: Self-pay | Admitting: Podiatry

## 2018-10-05 ENCOUNTER — Ambulatory Visit (INDEPENDENT_AMBULATORY_CARE_PROVIDER_SITE_OTHER): Payer: BLUE CROSS/BLUE SHIELD | Admitting: Podiatry

## 2018-10-05 ENCOUNTER — Ambulatory Visit (INDEPENDENT_AMBULATORY_CARE_PROVIDER_SITE_OTHER): Payer: BLUE CROSS/BLUE SHIELD

## 2018-10-05 ENCOUNTER — Encounter: Payer: Self-pay | Admitting: Podiatry

## 2018-10-05 VITALS — BP 120/74 | HR 53 | Temp 97.3°F | Resp 16

## 2018-10-05 DIAGNOSIS — M79672 Pain in left foot: Secondary | ICD-10-CM | POA: Diagnosis not present

## 2018-10-05 DIAGNOSIS — M722 Plantar fascial fibromatosis: Secondary | ICD-10-CM | POA: Diagnosis not present

## 2018-10-05 DIAGNOSIS — B351 Tinea unguium: Secondary | ICD-10-CM

## 2018-10-05 MED ORDER — TERBINAFINE HCL 250 MG PO TABS: 250.0000 mg | ORAL_TABLET | Freq: Every day | ORAL | 0 refills | Status: DC

## 2018-10-05 NOTE — Progress Notes (Signed)
Subjective:   Patient ID: Harry Cobb, male   DOB: 58 y.o.   MRN: [id number]   HPI Patient states he has had a 60-month history of pain in his left heel with a lot of discomfort and also is complaining of nail disease with discoloration mostly of his left nails with dark discoloration second right fourth left.  Patient states that it at times can be quite bothersome for him and patient does not smoke and likes to be active   Review of Systems  All other systems reviewed and are negative.       Objective:  Physical Exam Vitals signs and nursing note reviewed.  Constitutional:      Appearance: He is well-developed.  Pulmonary:     Effort: Pulmonary effort is normal.  Musculoskeletal: Normal range of motion.  Skin:    General: Skin is warm.  Neurological:     Mental Status: He is alert.     Neurovascular status intact muscle strength is found to be within normal limits with patient noted to have quite a bit of discomfort in the left plantar heel at the insertional point tendon into the calcaneus with inflammation fluid buildup and is noted to have discoloration of the hallux second third fourth fifth nails left foot with no discoloration of a significant nature of the right with traumatized right second nail.  Patient has good digital perfusion well oriented x3     Assessment:  Acute plantar fasciitis left with probable mycotic nail infection and slight skin infection left over right with also trauma secondary to activity levels     Plan:  H&P conditions reviewed and I discussed treatment options.  At this point I went ahead did sterile prep and injected the left fascia 3 mg Kenalog 5 mg Xylocaine applied fascial brace to lift up the arch and then discussed long-term treatment for the nails and have recommended oral antifungal agent in combination with laser of the nails and did discuss I think it will help his skin condition also.  Patient was given a prescription correction and  also was given she did have liver function studies done and patient will be seen back in 2 weeks and I also long-term discussed orthotics which I think will be of benefit to him.  Reappoint for visit and probable orthotics in 2 weeks  X-rays indicate that there is minimal spur formation no indications of stress fracture arthritis noted

## 2018-10-05 NOTE — Patient Instructions (Signed)

## 2018-10-05 NOTE — Progress Notes (Signed)
   Subjective:    Patient ID: Harry Cobb, male    DOB: 1960/10/22, 58 y.o.   MRN: [id number]  HPI    Review of Systems  All other systems reviewed and are negative.      Objective:   Physical Exam        Assessment & Plan:

## 2018-10-09 DIAGNOSIS — B351 Tinea unguium: Secondary | ICD-10-CM | POA: Diagnosis not present

## 2018-10-10 LAB — HEPATIC FUNCTION PANEL
AG Ratio: 1.6 (calc) (ref 1.0–2.5)
ALT: 13 U/L (ref 9–46)
AST: 17 U/L (ref 10–35)
Albumin: 4.5 g/dL (ref 3.6–5.1)
Alkaline phosphatase (APISO): 60 U/L (ref 35–144)
Bilirubin, Direct: 0.2 mg/dL (ref 0.0–0.2)
Globulin: 2.8 g/dL (calc) (ref 1.9–3.7)
Indirect Bilirubin: 0.8 mg/dL (calc) (ref 0.2–1.2)
Total Bilirubin: 1 mg/dL (ref 0.2–1.2)
Total Protein: 7.3 g/dL (ref 6.1–8.1)

## 2018-10-20 ENCOUNTER — Ambulatory Visit: Payer: BLUE CROSS/BLUE SHIELD | Admitting: Podiatry

## 2018-10-20 ENCOUNTER — Other Ambulatory Visit: Payer: BLUE CROSS/BLUE SHIELD

## 2018-10-27 ENCOUNTER — Ambulatory Visit (INDEPENDENT_AMBULATORY_CARE_PROVIDER_SITE_OTHER): Payer: Self-pay

## 2018-10-27 ENCOUNTER — Ambulatory Visit (INDEPENDENT_AMBULATORY_CARE_PROVIDER_SITE_OTHER): Payer: BLUE CROSS/BLUE SHIELD | Admitting: Podiatry

## 2018-10-27 ENCOUNTER — Other Ambulatory Visit: Payer: Self-pay

## 2018-10-27 ENCOUNTER — Encounter: Payer: Self-pay | Admitting: Podiatry

## 2018-10-27 VITALS — Temp 97.7°F

## 2018-10-27 DIAGNOSIS — M722 Plantar fascial fibromatosis: Secondary | ICD-10-CM

## 2018-10-27 DIAGNOSIS — B351 Tinea unguium: Secondary | ICD-10-CM

## 2018-10-27 MED ORDER — TRIAMCINOLONE ACETONIDE 10 MG/ML IJ SUSP
10.0000 mg | Freq: Once | INTRAMUSCULAR | Status: AC
  Administered 2018-10-27: 10 mg

## 2018-10-27 NOTE — Patient Instructions (Signed)
3 tablespoon of: coconut oil, olive oil, or almond oil ( any carrier oil) 10-15 drops of tea tree oil  Use mixture, rub into feet and toenails 2-3 times a week  Follow-up care is important to the success of your foot laser treatment. Please keep these instructions for future reference.    1. Normal activity can resume immediately.   2. IF you doctor prescribed an antifungal cream apply medication to the skin on the bottom of your feet, sides of your feet, between your toes, and around your nails. (This cream is not intended for use on your nails) Apply cream as directed  3. IF a topical for your nails was prescribed by your doctor, apply to nail/nails once daily until nail is free from infection unless otherwise directed by your doctor. Maximum use for nail topical is 12 months.   4. Spray the insides of your shoes with an over the counter antifungal spray or Lysol disinfectant (aerosol can) at the end of each day or use an ultra violet shoe sterilizer as directed. Try not to wear the same shoes everyday.   5. Buy new nail clippers and files since your current pair may be infected. Metal nail care instruments may also be cleaned with diluted bleach or boiling water. DO NOT share nail clippers or nail files.  6. Keep your toenails trimmed and clean.   7. Wear flip-flops in public places especially hotel rooms and showers, athletic club locker rooms and showers and indoor swimming pools.   8. Avoid nail salons that do not clean their instruments properly or use a whirlpool system. Follow-up care is important to the success of your foot laser treatment. Please keep these instructions for future reference.    2. Normal activity can resume immediately.   2. IF you doctor prescribed an antifungal cream apply medication to the skin on the bottom of your feet, sides of your feet, between your toes, and around your nails. (This cream is not intended for use on your nails) Apply cream as  directed  3. IF a topical for your nails was prescribed by your doctor, apply to nail/nails once daily until nail is free from infection unless otherwise directed by your doctor. Maximum use for nail topical is 12 months.   4. Spray the insides of your shoes with an over the counter antifungal spray or Lysol disinfectant (aerosol can) at the end of each day or use an ultra violet shoe sterilizer as directed. Try not to wear the same shoes everyday.   5. Buy new nail clippers and files since your current pair may be infected. Metal nail care instruments may also be cleaned with diluted bleach or boiling water. DO NOT share nail clippers or nail files.  6. Keep your toenails trimmed and clean.   7. Wear flip-flops in public places especially hotel rooms and showers, athletic club locker rooms and showers and indoor swimming pools.   8. Avoid nail salons that do not clean their instruments properly or use a whirlpool system.

## 2018-10-27 NOTE — Progress Notes (Signed)
Subjective:   Patient ID: Harry Cobb, male   DOB: 58 y.o.   MRN: [id number]   HPI Patient presents stating that his heel is doing quite a bit better but it still is sore if he tries to do any form of activity and he does like to play tennis and golf.   ROS      Objective:  Physical Exam  Upon palpation continued inflammation mostly in the central portion of the plantar fascial left with one area that still quite discomforting with flatfoot deformity     Assessment:  Continued acute plantar fasciitis left especially with activity driven events     Plan:  H&P condition reviewed and from the lateral side did a fascial injection 3 mg Kenalog 5 mg Xylocaine and also today went ahead and casted for functional orthotics to reduce stress on the heel.  Reappoint when orthotics are ready

## 2018-10-27 NOTE — Progress Notes (Signed)
Pt presents with mycotic infection of nails 1-5 bilateral.  All other systems are negative  Laser therapy administered to affected nails and tolerated well. All safety precautions were in place.  2nd treatment.  Follow up in 4 weeks     

## 2018-11-02 DIAGNOSIS — R1013 Epigastric pain: Secondary | ICD-10-CM | POA: Diagnosis not present

## 2018-11-02 DIAGNOSIS — K219 Gastro-esophageal reflux disease without esophagitis: Secondary | ICD-10-CM | POA: Diagnosis not present

## 2018-11-21 ENCOUNTER — Other Ambulatory Visit: Payer: Self-pay

## 2018-11-21 ENCOUNTER — Ambulatory Visit: Payer: BC Managed Care – PPO | Admitting: Orthotics

## 2018-11-21 DIAGNOSIS — M722 Plantar fascial fibromatosis: Secondary | ICD-10-CM

## 2018-11-21 NOTE — Progress Notes (Signed)
Patient came in today to pick up custom made foot orthotics.  The goals were accomplished and the patient reported no dissatisfaction with said orthotics.  Patient was advised of breakin period and how to report any issues. 

## 2018-11-23 ENCOUNTER — Other Ambulatory Visit: Payer: BLUE CROSS/BLUE SHIELD | Admitting: Orthotics

## 2018-11-30 DIAGNOSIS — Z20818 Contact with and (suspected) exposure to other bacterial communicable diseases: Secondary | ICD-10-CM | POA: Diagnosis not present

## 2018-11-30 DIAGNOSIS — R05 Cough: Secondary | ICD-10-CM | POA: Diagnosis not present

## 2018-12-01 ENCOUNTER — Ambulatory Visit: Payer: BC Managed Care – PPO

## 2018-12-11 ENCOUNTER — Emergency Department (HOSPITAL_COMMUNITY): Payer: BC Managed Care – PPO

## 2018-12-11 ENCOUNTER — Encounter (HOSPITAL_COMMUNITY): Payer: Self-pay | Admitting: Emergency Medicine

## 2018-12-11 ENCOUNTER — Other Ambulatory Visit: Payer: Self-pay

## 2018-12-11 ENCOUNTER — Emergency Department (HOSPITAL_COMMUNITY)
Admission: EM | Admit: 2018-12-11 | Discharge: 2018-12-11 | Disposition: A | Discharge status: Home / Self Care | Payer: BC Managed Care – PPO | Attending: Emergency Medicine | Admitting: Emergency Medicine

## 2018-12-11 DIAGNOSIS — Z79899 Other long term (current) drug therapy: Secondary | ICD-10-CM | POA: Insufficient documentation

## 2018-12-11 DIAGNOSIS — Z96653 Presence of artificial knee joint, bilateral: Secondary | ICD-10-CM | POA: Insufficient documentation

## 2018-12-11 DIAGNOSIS — R0602 Shortness of breath: Secondary | ICD-10-CM | POA: Diagnosis not present

## 2018-12-11 DIAGNOSIS — Z96642 Presence of left artificial hip joint: Secondary | ICD-10-CM | POA: Insufficient documentation

## 2018-12-11 DIAGNOSIS — U071 COVID-19: Secondary | ICD-10-CM | POA: Diagnosis not present

## 2018-12-11 NOTE — Discharge Instructions (Signed)
Continue home isolation.  Please return to ED if symptoms worsen as discussed.  Continue Tylenol for fever.

## 2018-12-11 NOTE — ED Triage Notes (Signed)
Pt reports 2 Thursdays ago he tested positive for Covid with [facility]. Reports has SOB with exertion. PCP advised to go to ED. reports has cough intermittently and will have pain with coughing.

## 2018-12-11 NOTE — ED Provider Notes (Signed)
[facility] DEPT Provider Note   CSN: [id number] Arrival date & time: 12/11/18  1241    History   Chief Complaint Chief Complaint  Patient presents with  . Shortness of Breath  . Covid+    HPI Harry Cobb is a 58 y.o. male.     The history is provided by the patient.  Shortness of Breath Severity:  Mild Onset quality:  Gradual Timing:  Intermittent Progression:  Worsening Chronicity:  New Context comment:  COVID positive 12 days ago, slightly worse SOB over last two days. Worse with exertion.  Relieved by:  Rest Worsened by:  Activity Associated symptoms: fever   Associated symptoms: no abdominal pain, no chest pain, no cough, no ear pain, no rash, no sore throat and no vomiting     Past Medical History:  Diagnosis Date  . Arthritis   . No pertinent past medical history     Patient Active Problem List   Diagnosis Date Noted  . Impingement syndrome of left shoulder region 06/23/2018  . Pain in joint of left shoulder 04/26/2018  . Low back pain 08/24/2017  . Knee stiffness, left 08/03/2016  . S/P left THA, AA 08/12/2015  . Expected blood loss anemia, postoperative 11/21/2012  . Overweight (BMI 25.0-29.9) 11/21/2012  . S/P left UKA to TKA 11/20/2012  . Umbilical hernia 58/52/7782    Past Surgical History:  Procedure Laterality Date  . BACK SURGERY  2009  . BICEPS TENDON REPAIR  2011  . EYE SURGERY     Lasik  . JOINT REPLACEMENT     both partial knee replacements  . KNEE ARTHROSCOPY  11/12   lt  . KNEE SURGERY  2009/2011   Rt and Lt knee replacment  . SCAR REVISION Left 08/03/2016   Procedure: Left hip open scar debridement ;  Surgeon: Paralee Cancel, MD;  [facility];  Service: Orthopedics;  Laterality: Left;  . shoulder cyst  06/04/11   right cyst removed  . TONSILLECTOMY    . TOTAL HIP ARTHROPLASTY Left 08/12/2015   Procedure: LEFT TOTAL HIP ARTHROPLASTY ANTERIOR APPROACH;  Surgeon: Paralee Cancel, MD;  [facility];  Service: Orthopedics;  Laterality: Left;  . TOTAL KNEE ARTHROPLASTY Left 11/20/2012   Procedure: CONVERT LEFT UNI KNEE ARTHROPLASTY TO LEFT TOTAL KNEE ARTHROPLASTY;  Surgeon: Mauri Pole, MD;  [facility];  Service: Orthopedics;  Laterality: Left;  . TOTAL KNEE REVISION WITH SCAR DEBRIDEMENT/PATELLA REVISION WITH POLY EXCHANGE Left 08/03/2016   Procedure: Left knee open scar debridement, poly exchange ;  Surgeon: Paralee Cancel, MD;  [facility];  Service: Orthopedics;  Laterality: Left;  . UMBILICAL HERNIA REPAIR  06/04/2011   Procedure: HERNIA REPAIR UMBILICAL ADULT;  Surgeon: Rolm Bookbinder, MD;  Location: [facility];  Service: General;  Laterality: N/A;  umbilical hernia repiar and mesh and removal of lympoma on right shoulder        Home Medications    Prior to Admission medications   Medication Sig Start Date End Date Taking? Authorizing Provider  azelastine (ASTELIN) 0.1 % nasal spray Place 2 sprays into both nostrils daily. 07/19/16   [provider]  fexofenadine (ALLEGRA) 180 MG tablet Take 180 mg by mouth daily.    [provider]  ipratropium (ATROVENT) 0.06 % nasal spray Place 2 sprays into both nostrils daily. 07/15/16   [provider]  naproxen (NAPROSYN) 500 MG tablet TK 1 T PO BID 09/08/18   [provider]  terbinafine (LAMISIL) 250 MG tablet Take 1 tablet (250 mg total) by mouth daily. 10/05/18   Wallene Huh, DPM    Family History No family history on file.  Social History Social History   Tobacco Use  . Smoking status: Never Smoker  . Smokeless tobacco: Never Used  Substance Use Topics  . Alcohol use: Yes    Comment: occassionally  . Drug use: No     Allergies   Patient has no known allergies.   Review of Systems Review of Systems  Constitutional: Positive for fever. Negative for chills.  HENT: Negative for ear pain and sore throat.   Eyes: Negative for pain and visual disturbance.   Respiratory: Positive for shortness of breath. Negative for cough.   Cardiovascular: Negative for chest pain and palpitations.  Gastrointestinal: Negative for abdominal pain and vomiting.  Genitourinary: Negative for dysuria and hematuria.  Musculoskeletal: Negative for arthralgias and back pain.  Skin: Negative for color change and rash.  Neurological: Negative for seizures and syncope.  All other systems reviewed and are negative.    Physical Exam Updated Vital Signs  ED Triage Vitals  Enc Vitals Group     BP 12/11/18 1252 97/73     Pulse Rate 12/11/18 1252 83     Resp 12/11/18 1252 19     Temp 12/11/18 1252 98.4 F (36.9 C)     Temp Source 12/11/18 1252 Oral     SpO2 12/11/18 1252 94 %     Weight --      Height --      Head Circumference --      Peak Flow --      Pain Score 12/11/18 1253 0     Pain Loc --      Pain Edu? --      Excl. in [place]? --     Physical Exam Vitals signs and nursing note reviewed.  Constitutional:      General: He is not in acute distress.    Appearance: He is well-developed. He is not ill-appearing.  HENT:     Head: Normocephalic and atraumatic.     Mouth/Throat:     Pharynx: Oropharynx is clear.  Eyes:     Extraocular Movements: Extraocular movements intact.     Conjunctiva/sclera: Conjunctivae normal.     Pupils: Pupils are equal, round, and reactive to light.  Neck:     Musculoskeletal: Normal range of motion and neck supple.  Cardiovascular:     Rate and Rhythm: Normal rate and regular rhythm.     Heart sounds: No murmur.  Pulmonary:     Effort: Pulmonary effort is normal. No respiratory distress.     Breath sounds: No decreased breath sounds, wheezing, rhonchi or rales.     Comments: coarse Abdominal:     Palpations: Abdomen is soft.     Tenderness: There is no abdominal tenderness.  Skin:    General: Skin is warm and dry.     Capillary Refill: Capillary refill takes less than 2 seconds.  Neurological:     General: No focal  deficit present.     Mental Status: He is alert.      ED Treatments / Results  Labs (all labs ordered are listed, but only abnormal results are displayed) Labs Reviewed - No data to display  EKG None  Radiology Dg Chest Portable 1 View  Result Date: 12/11/2018 CLINICAL DATA:  Shortness of breath.  COVID-19 positive. EXAM: PORTABLE CHEST 1 VIEW COMPARISON:  Chest x-ray dated 07/02/2004 FINDINGS: The patient has faint bilateral peripheral pulmonary infiltrates bilaterally, primarily in the lung bases and mid zones. Heart size and vascularity are normal. No appreciable effusions. No acute bone abnormality. IMPRESSION: Bilateral faint peripheral infiltrates consistent with COVID-19 pneumonia. Electronically Signed   By: Lorriane Shire M.D.   On: 12/11/2018 14:03    Procedures Procedures (including critical care time)  Medications Ordered in ED Medications - No data to display   Initial Impression / Assessment and Plan / ED Course  I have reviewed the triage vital signs and the nursing notes.  Pertinent labs & imaging results that were available during my care of the patient were reviewed by me and considered in my medical decision making (see chart for details).     Harry Cobb is a 58 year old male with no significant medical history who presents to the ED with shortness of breath.  Patient known to be positive for coronavirus.  Patient with normal vitals.  Normal work of breathing.  Room air oxygenation between 92 and 96%.  Stable O2 with ambulation.  Patient states mild increase in shortness of breath with activity but no symptoms at rest.  X-ray showed some faint peripheral infiltrates consistent with COVID.  No major effusions.  Overall patient has mild symptoms that are with exertion.  Has normal oxygen normal work of breathing.  Has had on and off fever.  Tested +12 days ago.  Suspect patient will continue to do well.  Was given strict return precautions and discharged from ED  in good condition.  This chart was dictated using voice recognition software.  Despite best efforts to proofread,  errors can occur which can change the documentation meaning.  Harry Cobb was evaluated in Emergency Department on 12/11/2018 for the symptoms described in the history of present illness. He was evaluated in the context of the global COVID-19 pandemic, which necessitated consideration that the patient might be at risk for infection with the SARS-CoV-2 virus that causes COVID-19. Institutional protocols and algorithms that pertain to the evaluation of patients at risk for COVID-19 are in a state of rapid change based on information released by regulatory bodies including the CDC and federal and state organizations. These policies and algorithms were followed during the patient's care in the ED.     Final Clinical Impressions(s) / ED Diagnoses   Final diagnoses:  COVID-19  Shortness of breath    ED Discharge Orders    None       Lennice Sites, DO 12/11/18 1439

## 2018-12-11 NOTE — ED Notes (Signed)
Bed: [facility] Expected date:  Expected time:  Means of arrival:  Comments: 

## 2019-01-08 ENCOUNTER — Encounter: Payer: Self-pay | Admitting: Podiatry

## 2019-01-08 ENCOUNTER — Ambulatory Visit: Payer: BC Managed Care – PPO

## 2019-01-08 ENCOUNTER — Other Ambulatory Visit: Payer: Self-pay

## 2019-01-08 ENCOUNTER — Ambulatory Visit: Payer: Self-pay | Admitting: Podiatry

## 2019-01-08 VITALS — Temp 97.7°F

## 2019-01-08 DIAGNOSIS — B351 Tinea unguium: Secondary | ICD-10-CM | POA: Diagnosis not present

## 2019-01-08 DIAGNOSIS — M722 Plantar fascial fibromatosis: Secondary | ICD-10-CM

## 2019-01-08 MED ORDER — NAPROXEN 500 MG PO TABS: 500.0000 mg | ORAL_TABLET | Freq: Two times a day (BID) | ORAL | 2 refills | Status: DC

## 2019-01-08 NOTE — Progress Notes (Signed)
Subjective:   Patient ID: Harry Cobb, male   DOB: 58 y.o.   MRN: [id number]   HPI Patient presents stating overall doing pretty well with mild discomfort if he is too active   ROS      Objective:  Physical Exam  Neurovascular status intact with moderate discomfort only upon deep palpation plantar heel left with overall good improvement with patient also noted to have improvement in his nail disease with medication     Assessment:  Plantar fasciitis improving left along with mycotic nail infection with improvement     Plan:  H&P conditions reviewed and recommended the continuation of conservative care stretching gradual increase in activities and orthotic usage and we will make him a second pair of orthotics.  Nails he will finish his medication and I explained he can take 6 months to 1 year to get complete resolution as far as it will go and I did write him for Naprosyn prescription and to use periodically signed visit

## 2019-01-22 ENCOUNTER — Telehealth: Payer: Self-pay | Admitting: Podiatry

## 2019-01-22 NOTE — Telephone Encounter (Signed)
Left message for pt to call to discuss 2nd pair of orthotics.Marland KitchenMarland Kitchen

## 2019-01-24 NOTE — Telephone Encounter (Signed)
Dawn, do I need to call him?

## 2019-02-01 NOTE — Progress Notes (Signed)
Pt presents with mycotic infection of nails 1-5 bilateral.  All other systems are negative  Laser therapy administered to affected nails and tolerated well. All safety precautions were in place.  3rd treatment.  Follow up in 4 weeks     

## 2019-02-05 ENCOUNTER — Ambulatory Visit (INDEPENDENT_AMBULATORY_CARE_PROVIDER_SITE_OTHER): Payer: Self-pay

## 2019-02-05 ENCOUNTER — Other Ambulatory Visit: Payer: Self-pay

## 2019-02-05 DIAGNOSIS — B351 Tinea unguium: Secondary | ICD-10-CM

## 2019-02-05 DIAGNOSIS — M79676 Pain in unspecified toe(s): Secondary | ICD-10-CM

## 2019-02-09 DIAGNOSIS — Q849 Congenital malformation of integument, unspecified: Secondary | ICD-10-CM | POA: Diagnosis not present

## 2019-02-21 DIAGNOSIS — M24662 Ankylosis, left knee: Secondary | ICD-10-CM | POA: Diagnosis not present

## 2019-03-06 ENCOUNTER — Ambulatory Visit: Payer: Self-pay

## 2019-03-06 ENCOUNTER — Other Ambulatory Visit: Payer: Self-pay

## 2019-03-06 DIAGNOSIS — B351 Tinea unguium: Secondary | ICD-10-CM

## 2019-03-13 NOTE — Progress Notes (Signed)
Pt presents with mycotic infection of nails 1-5 bilateral  All other systems are negative  Laser therapy administered to affected nails and tolerated well. All safety precautions were in place. 4th treatment.  Follow up in 4 weeks     

## 2019-03-27 NOTE — Progress Notes (Signed)
Pt presents with mycotic infection of nails 1-5 bilateral All other systems are negative  Laser therapy administered to affected nails and tolerated well. All safety precautions were in place. 5th treatment.  Follow up in 4 weeks     

## 2019-03-30 ENCOUNTER — Other Ambulatory Visit: Payer: Self-pay | Admitting: Podiatry

## 2019-05-21 ENCOUNTER — Other Ambulatory Visit: Payer: Self-pay

## 2019-05-21 DIAGNOSIS — Z20822 Contact with and (suspected) exposure to covid-19: Secondary | ICD-10-CM

## 2019-05-22 LAB — NOVEL CORONAVIRUS, NAA: SARS-CoV-2, NAA: NOT DETECTED

## 2019-06-06 DIAGNOSIS — R05 Cough: Secondary | ICD-10-CM | POA: Diagnosis not present

## 2019-06-13 DIAGNOSIS — E7800 Pure hypercholesterolemia, unspecified: Secondary | ICD-10-CM | POA: Diagnosis not present

## 2019-06-13 DIAGNOSIS — Z125 Encounter for screening for malignant neoplasm of prostate: Secondary | ICD-10-CM | POA: Diagnosis not present

## 2019-06-13 DIAGNOSIS — Z20828 Contact with and (suspected) exposure to other viral communicable diseases: Secondary | ICD-10-CM | POA: Diagnosis not present

## 2019-06-20 DIAGNOSIS — R82998 Other abnormal findings in urine: Secondary | ICD-10-CM | POA: Diagnosis not present

## 2019-06-20 DIAGNOSIS — Z1212 Encounter for screening for malignant neoplasm of rectum: Secondary | ICD-10-CM | POA: Diagnosis not present

## 2019-06-21 DIAGNOSIS — K219 Gastro-esophageal reflux disease without esophagitis: Secondary | ICD-10-CM | POA: Diagnosis not present

## 2019-06-21 DIAGNOSIS — R195 Other fecal abnormalities: Secondary | ICD-10-CM | POA: Diagnosis not present

## 2019-06-21 DIAGNOSIS — Z0000 Encounter for general adult medical examination without abnormal findings: Secondary | ICD-10-CM | POA: Diagnosis not present

## 2019-06-21 DIAGNOSIS — Z1331 Encounter for screening for depression: Secondary | ICD-10-CM | POA: Diagnosis not present

## 2019-06-21 DIAGNOSIS — E7800 Pure hypercholesterolemia, unspecified: Secondary | ICD-10-CM | POA: Diagnosis not present

## 2019-06-21 DIAGNOSIS — J302 Other seasonal allergic rhinitis: Secondary | ICD-10-CM | POA: Diagnosis not present

## 2019-07-10 ENCOUNTER — Other Ambulatory Visit: Payer: Self-pay | Admitting: Podiatry

## 2019-07-12 DIAGNOSIS — R195 Other fecal abnormalities: Secondary | ICD-10-CM | POA: Diagnosis not present

## 2019-07-26 DIAGNOSIS — Z1159 Encounter for screening for other viral diseases: Secondary | ICD-10-CM | POA: Diagnosis not present

## 2019-07-31 DIAGNOSIS — R195 Other fecal abnormalities: Secondary | ICD-10-CM | POA: Diagnosis not present

## 2019-08-06 DIAGNOSIS — J3089 Other allergic rhinitis: Secondary | ICD-10-CM | POA: Diagnosis not present

## 2019-08-06 DIAGNOSIS — B999 Unspecified infectious disease: Secondary | ICD-10-CM | POA: Diagnosis not present

## 2019-08-06 DIAGNOSIS — L299 Pruritus, unspecified: Secondary | ICD-10-CM | POA: Diagnosis not present

## 2019-08-06 DIAGNOSIS — J301 Allergic rhinitis due to pollen: Secondary | ICD-10-CM | POA: Diagnosis not present

## 2019-08-10 DIAGNOSIS — M9902 Segmental and somatic dysfunction of thoracic region: Secondary | ICD-10-CM | POA: Diagnosis not present

## 2019-08-10 DIAGNOSIS — M5127 Other intervertebral disc displacement, lumbosacral region: Secondary | ICD-10-CM | POA: Diagnosis not present

## 2019-08-10 DIAGNOSIS — M9903 Segmental and somatic dysfunction of lumbar region: Secondary | ICD-10-CM | POA: Diagnosis not present

## 2019-08-10 DIAGNOSIS — M9901 Segmental and somatic dysfunction of cervical region: Secondary | ICD-10-CM | POA: Diagnosis not present

## 2019-09-05 DIAGNOSIS — M25552 Pain in left hip: Secondary | ICD-10-CM | POA: Diagnosis not present

## 2019-09-05 DIAGNOSIS — Z471 Aftercare following joint replacement surgery: Secondary | ICD-10-CM | POA: Diagnosis not present

## 2019-09-05 DIAGNOSIS — Z96652 Presence of left artificial knee joint: Secondary | ICD-10-CM | POA: Diagnosis not present

## 2019-11-29 DIAGNOSIS — M1990 Unspecified osteoarthritis, unspecified site: Secondary | ICD-10-CM | POA: Diagnosis not present

## 2019-12-13 DIAGNOSIS — M5136 Other intervertebral disc degeneration, lumbar region: Secondary | ICD-10-CM | POA: Insufficient documentation

## 2019-12-13 DIAGNOSIS — M961 Postlaminectomy syndrome, not elsewhere classified: Secondary | ICD-10-CM | POA: Insufficient documentation

## 2019-12-13 DIAGNOSIS — M542 Cervicalgia: Secondary | ICD-10-CM | POA: Diagnosis not present

## 2019-12-13 DIAGNOSIS — M5030 Other cervical disc degeneration, unspecified cervical region: Secondary | ICD-10-CM | POA: Insufficient documentation

## 2019-12-27 DIAGNOSIS — M5030 Other cervical disc degeneration, unspecified cervical region: Secondary | ICD-10-CM | POA: Diagnosis not present

## 2020-01-07 DIAGNOSIS — M5136 Other intervertebral disc degeneration, lumbar region: Secondary | ICD-10-CM | POA: Diagnosis not present

## 2020-01-07 DIAGNOSIS — M961 Postlaminectomy syndrome, not elsewhere classified: Secondary | ICD-10-CM | POA: Diagnosis not present

## 2020-01-07 DIAGNOSIS — M5030 Other cervical disc degeneration, unspecified cervical region: Secondary | ICD-10-CM | POA: Diagnosis not present

## 2020-01-24 DIAGNOSIS — M5136 Other intervertebral disc degeneration, lumbar region: Secondary | ICD-10-CM | POA: Diagnosis not present

## 2020-01-24 DIAGNOSIS — M961 Postlaminectomy syndrome, not elsewhere classified: Secondary | ICD-10-CM | POA: Diagnosis not present

## 2020-01-24 DIAGNOSIS — M5030 Other cervical disc degeneration, unspecified cervical region: Secondary | ICD-10-CM | POA: Diagnosis not present

## 2020-01-30 DIAGNOSIS — Z20822 Contact with and (suspected) exposure to covid-19: Secondary | ICD-10-CM | POA: Diagnosis not present

## 2020-03-19 DIAGNOSIS — M533 Sacrococcygeal disorders, not elsewhere classified: Secondary | ICD-10-CM | POA: Diagnosis not present

## 2020-03-25 DIAGNOSIS — M5137 Other intervertebral disc degeneration, lumbosacral region: Secondary | ICD-10-CM | POA: Diagnosis not present

## 2020-04-09 DIAGNOSIS — M5137 Other intervertebral disc degeneration, lumbosacral region: Secondary | ICD-10-CM | POA: Diagnosis not present

## 2020-04-09 DIAGNOSIS — M545 Low back pain, unspecified: Secondary | ICD-10-CM | POA: Diagnosis not present

## 2020-04-10 DIAGNOSIS — M5416 Radiculopathy, lumbar region: Secondary | ICD-10-CM | POA: Diagnosis not present

## 2020-04-24 ENCOUNTER — Other Ambulatory Visit: Payer: Self-pay | Admitting: Neurological Surgery

## 2020-05-02 DIAGNOSIS — Z6829 Body mass index (BMI) 29.0-29.9, adult: Secondary | ICD-10-CM | POA: Diagnosis not present

## 2020-05-02 DIAGNOSIS — M5416 Radiculopathy, lumbar region: Secondary | ICD-10-CM | POA: Diagnosis not present

## 2020-05-14 ENCOUNTER — Other Ambulatory Visit: Payer: Self-pay

## 2020-05-19 ENCOUNTER — Other Ambulatory Visit: Payer: Self-pay

## 2020-05-20 DIAGNOSIS — M9903 Segmental and somatic dysfunction of lumbar region: Secondary | ICD-10-CM | POA: Diagnosis not present

## 2020-05-20 DIAGNOSIS — M5411 Radiculopathy, occipito-atlanto-axial region: Secondary | ICD-10-CM | POA: Diagnosis not present

## 2020-05-20 DIAGNOSIS — M9901 Segmental and somatic dysfunction of cervical region: Secondary | ICD-10-CM | POA: Diagnosis not present

## 2020-05-21 DIAGNOSIS — M5411 Radiculopathy, occipito-atlanto-axial region: Secondary | ICD-10-CM | POA: Diagnosis not present

## 2020-05-21 DIAGNOSIS — M9903 Segmental and somatic dysfunction of lumbar region: Secondary | ICD-10-CM | POA: Diagnosis not present

## 2020-05-21 DIAGNOSIS — M9901 Segmental and somatic dysfunction of cervical region: Secondary | ICD-10-CM | POA: Diagnosis not present

## 2020-05-22 DIAGNOSIS — M9901 Segmental and somatic dysfunction of cervical region: Secondary | ICD-10-CM | POA: Diagnosis not present

## 2020-05-22 DIAGNOSIS — M5411 Radiculopathy, occipito-atlanto-axial region: Secondary | ICD-10-CM | POA: Diagnosis not present

## 2020-05-22 DIAGNOSIS — M9903 Segmental and somatic dysfunction of lumbar region: Secondary | ICD-10-CM | POA: Diagnosis not present

## 2020-05-26 DIAGNOSIS — M9901 Segmental and somatic dysfunction of cervical region: Secondary | ICD-10-CM | POA: Diagnosis not present

## 2020-05-26 DIAGNOSIS — M9903 Segmental and somatic dysfunction of lumbar region: Secondary | ICD-10-CM | POA: Diagnosis not present

## 2020-05-26 DIAGNOSIS — M5411 Radiculopathy, occipito-atlanto-axial region: Secondary | ICD-10-CM | POA: Diagnosis not present

## 2020-05-27 ENCOUNTER — Ambulatory Visit (INDEPENDENT_AMBULATORY_CARE_PROVIDER_SITE_OTHER): Payer: BC Managed Care – PPO | Admitting: Vascular Surgery

## 2020-05-27 ENCOUNTER — Other Ambulatory Visit: Payer: Self-pay

## 2020-05-27 ENCOUNTER — Encounter: Payer: Self-pay | Admitting: Vascular Surgery

## 2020-05-27 VITALS — BP 129/85 | HR 51 | Temp 97.9°F | Resp 18 | Ht 71.5 in | Wt 207.0 lb

## 2020-05-27 DIAGNOSIS — M5136 Other intervertebral disc degeneration, lumbar region: Secondary | ICD-10-CM

## 2020-05-27 DIAGNOSIS — M9901 Segmental and somatic dysfunction of cervical region: Secondary | ICD-10-CM | POA: Diagnosis not present

## 2020-05-27 DIAGNOSIS — M5411 Radiculopathy, occipito-atlanto-axial region: Secondary | ICD-10-CM | POA: Diagnosis not present

## 2020-05-27 DIAGNOSIS — M9903 Segmental and somatic dysfunction of lumbar region: Secondary | ICD-10-CM | POA: Diagnosis not present

## 2020-05-27 NOTE — Progress Notes (Signed)
Patient name: Harry Cobb MRN: [id number] DOB: 04/28/1961 Sex: male  REASON FOR CONSULT: Evaluate for L5-S1 ALIF  HPI: Harry Cobb is a 59 y.o. male, with history of chronic lower back pain with left leg radiculopathy that presents for L5-S1 ALIF evaluation.  Patient states he has had years of back pain.  He states he had a previous L4-L5 discectomy.  He has been trying to manage his current pain level with nonoperative management that unfortunately has been unsuccessful.  He can no longer play golf.  He has been evaluated by Dr. Ronnald Ramp who has recommended an L5-S1 ALIF.  Previous abdominal surgery includes a umbilical hernia repair in 2012 by Dr. Donne Hazel that was done open with mesh.  HE is retired from [employer].  Past Medical History:  Diagnosis Date  . Arthritis   . Back pain    radiates into the left posterior thigh to the calf.  . Left shoulder pain   . Leg pain    Worse than back pain  . Lumbar foraminal stenosis   . No pertinent past medical history   . Radiculopathy, lumbar region     Past Surgical History:  Procedure Laterality Date  . BACK SURGERY  2009  . BICEPS TENDON REPAIR  2011  . EYE SURGERY     Lasik  . JOINT REPLACEMENT     both partial knee replacements  . KNEE ARTHROSCOPY  11/12   lt  . KNEE SURGERY  2009/2011   Rt and Lt knee replacment  . LASER SPINE SURGERY    . SCAR REVISION Left 08/03/2016   Procedure: Left hip open scar debridement ;  Surgeon: Paralee Cancel, MD;  [facility];  Service: Orthopedics;  Laterality: Left;  . shoulder cyst  06/04/11   right cyst removed  . TONSILLECTOMY    . TOTAL HIP ARTHROPLASTY Left 08/12/2015   Procedure: LEFT TOTAL HIP ARTHROPLASTY ANTERIOR APPROACH;  Surgeon: Paralee Cancel, MD;  [facility];  Service: Orthopedics;  Laterality: Left;  . TOTAL KNEE ARTHROPLASTY Left 11/20/2012   Procedure: CONVERT LEFT UNI KNEE ARTHROPLASTY TO LEFT TOTAL KNEE ARTHROPLASTY;  Surgeon: Mauri Pole, MD;  [facility];  Service: Orthopedics;  Laterality: Left;  . TOTAL KNEE REVISION WITH SCAR DEBRIDEMENT/PATELLA REVISION WITH POLY EXCHANGE Left 08/03/2016   Procedure: Left knee open scar debridement, poly exchange ;  Surgeon: Paralee Cancel, MD;  [facility];  Service: Orthopedics;  Laterality: Left;  . UMBILICAL HERNIA REPAIR  06/04/2011   Procedure: HERNIA REPAIR UMBILICAL ADULT;  Surgeon: Rolm Bookbinder, MD;  Location: [facility];  Service: General;  Laterality: N/A;  umbilical hernia repiar and mesh and removal of lympoma on right shoulder    History reviewed. No pertinent family history.  SOCIAL HISTORY: Social History   Socioeconomic History  . Marital status: Married    Spouse name: Not on file  . Number of children: Not on file  . Years of education: Not on file  . Highest education level: Not on file  Occupational History  . Not on file  Tobacco Use  . Smoking status: Never Smoker  . Smokeless tobacco: Never Used  Vaping Use  . Vaping Use: Never used  Substance and Sexual Activity  . Alcohol use: Yes    Comment: occassionally  . Drug use: No  . Sexual activity: Not on file  Other Topics Concern  . Not on file  Social History Narrative  . Not on  file   Social Determinants of Health   Financial Resource Strain:   . Difficulty of Paying Living Expenses: Not on file  Food Insecurity:   . Worried About Charity fundraiser in the Last Year: Not on file  . Ran Out of Food in the Last Year: Not on file  Transportation Needs:   . Lack of Transportation (Medical): Not on file  . Lack of Transportation (Non-Medical): Not on file  Physical Activity:   . Days of Exercise per Week: Not on file  . Minutes of Exercise per Session: Not on file  Stress:   . Feeling of Stress : Not on file  Social Connections:   . Frequency of Communication with Friends and Family: Not on file  . Frequency of Social Gatherings with Friends and Family: Not on file  . Attends  Religious Services: Not on file  . Active Member of Clubs or Organizations: Not on file  . Attends Archivist Meetings: Not on file  . Marital Status: Not on file  Intimate Partner Violence:   . Fear of Current or Ex-Partner: Not on file  . Emotionally Abused: Not on file  . Physically Abused: Not on file  . Sexually Abused: Not on file    No Known Allergies  Current Outpatient Medications  Medication Sig Dispense Refill  . fexofenadine (ALLEGRA) 180 MG tablet Take 180 mg by mouth daily.    . Ibuprofen (ADVIL PO) Advil    . ipratropium (ATROVENT) 0.06 % nasal spray Place 2 sprays into both nostrils daily.  0  . naproxen (NAPROSYN) 500 MG tablet TAKE 1 TABLET(500 MG) BY MOUTH TWICE DAILY WITH A MEAL 60 tablet 2  . Adalimumab (HUMIRA) 40 MG/0.8ML PSKT Inject into the skin. (Patient not taking: Reported on 05/27/2020)    . azelastine (ASTELIN) 0.1 % nasal spray Place 2 sprays into both nostrils daily. (Patient not taking: Reported on 05/27/2020)  0  . gabapentin (NEURONTIN) 300 MG capsule Take 300 mg by mouth 3 (three) times daily. (Patient not taking: Reported on 05/27/2020)    . pantoprazole (PROTONIX) 40 MG tablet pantoprazole 40 mg tablet,delayed release (Patient not taking: Reported on 05/27/2020)    . terbinafine (LAMISIL) 250 MG tablet Take 1 tablet (250 mg total) by mouth daily. (Patient not taking: Reported on 05/27/2020) 90 tablet 0   No current facility-administered medications for this visit.    REVIEW OF SYSTEMS:  [X]  denotes positive finding, [ ]  denotes negative finding Cardiac  Comments:  Chest pain or chest pressure:    Shortness of breath upon exertion:    Short of breath when lying flat:    Irregular heart rhythm:        Vascular    Pain in calf, thigh, or hip brought on by ambulation:    Pain in feet at night that wakes you up from your sleep:     Blood clot in your veins:    Leg swelling:         Pulmonary    Oxygen at home:    Productive cough:       Wheezing:         Neurologic    Sudden weakness in arms or legs:     Sudden numbness in arms or legs:     Sudden onset of difficulty speaking or slurred speech:    Temporary loss of vision in one eye:     Problems with dizziness:  Gastrointestinal    Blood in stool:     Vomited blood:         Genitourinary    Burning when urinating:     Blood in urine:        Psychiatric    Major depression:         Hematologic    Bleeding problems:    Problems with blood clotting too easily:        Skin    Rashes or ulcers:        Constitutional    Fever or chills:      PHYSICAL EXAM: Vitals:   05/27/20 1423  BP: 129/85  Pulse: (!) 51  Resp: 18  Temp: 97.9 F (36.6 C)  TempSrc: Temporal  SpO2: 95%  Weight: 207 lb (93.9 kg)  Height: 5' 11.5" (1.816 m)    GENERAL: The patient is a well-nourished male, in no acute distress. The vital signs are documented above. CARDIAC: There is a regular rate and rhythm.  VASCULAR:  Palpable femoral pulses both groins Palpable DP pulses bilaterally PULMONARY: No respiratory distress. ABDOMEN: Soft and non-tender. MUSCULOSKELETAL: There are no major deformities or cyanosis. NEUROLOGIC: No focal weakness or paresthesias are detected. SKIN: There are no ulcers or rashes noted. PSYCHIATRIC: The patient has a normal affect.  DATA:   I independently reviewed his MRI lumbar spine from 04/09/2020 and his iliac vein bifurcation appears to be at the L4-L5 disc base  Assessment/Plan:  59 year old male with chronic lower back pain and left leg radiculopathy that presents for pre-op evaluation of L5-S1 ALIF with Dr. Ronnald Ramp.  I reviewed his MRI lumbar spine and I think he would be a good candidate for anterior approach.  His only previous abdominal surgery is umbilical hernia repair and we should be able to stay away from this incision.  I discussed plans for transverse incision over the left rectus muscle entering the retroperitoneal space  laterally and mobilizing the peritoneum and left ureter across midline and potentially mobilizing the left iliac artery and vein if needed.  We discussed risk of injury to the above structures.  Look forward to assisting Dr. Ronnald Ramp.   Marty Heck, MD [facility] Office: [phone]

## 2020-05-28 DIAGNOSIS — M9903 Segmental and somatic dysfunction of lumbar region: Secondary | ICD-10-CM | POA: Diagnosis not present

## 2020-05-28 DIAGNOSIS — M5411 Radiculopathy, occipito-atlanto-axial region: Secondary | ICD-10-CM | POA: Diagnosis not present

## 2020-05-28 DIAGNOSIS — Z20822 Contact with and (suspected) exposure to covid-19: Secondary | ICD-10-CM | POA: Diagnosis not present

## 2020-05-28 DIAGNOSIS — M9901 Segmental and somatic dysfunction of cervical region: Secondary | ICD-10-CM | POA: Diagnosis not present

## 2020-05-29 DIAGNOSIS — M9901 Segmental and somatic dysfunction of cervical region: Secondary | ICD-10-CM | POA: Diagnosis not present

## 2020-05-29 DIAGNOSIS — M9903 Segmental and somatic dysfunction of lumbar region: Secondary | ICD-10-CM | POA: Diagnosis not present

## 2020-05-29 DIAGNOSIS — M5411 Radiculopathy, occipito-atlanto-axial region: Secondary | ICD-10-CM | POA: Diagnosis not present

## 2020-06-03 ENCOUNTER — Encounter: Payer: BC Managed Care – PPO | Admitting: Vascular Surgery

## 2020-06-09 DIAGNOSIS — Z20822 Contact with and (suspected) exposure to covid-19: Secondary | ICD-10-CM | POA: Diagnosis not present

## 2020-06-17 DIAGNOSIS — M47812 Spondylosis without myelopathy or radiculopathy, cervical region: Secondary | ICD-10-CM | POA: Diagnosis not present

## 2020-06-17 DIAGNOSIS — Z6829 Body mass index (BMI) 29.0-29.9, adult: Secondary | ICD-10-CM | POA: Diagnosis not present

## 2020-06-17 DIAGNOSIS — Z87442 Personal history of urinary calculi: Secondary | ICD-10-CM | POA: Diagnosis not present

## 2020-06-17 DIAGNOSIS — N5201 Erectile dysfunction due to arterial insufficiency: Secondary | ICD-10-CM | POA: Diagnosis not present

## 2020-06-19 ENCOUNTER — Ambulatory Visit (HOSPITAL_COMMUNITY)
Admission: RE | Admit: 2020-06-19 | Discharge: 2020-06-19 | Disposition: A | Discharge status: Home / Self Care | Payer: BC Managed Care – PPO | Source: Ambulatory Visit | Attending: Neurological Surgery | Admitting: Neurological Surgery

## 2020-06-19 ENCOUNTER — Encounter (HOSPITAL_COMMUNITY)
Admission: RE | Admit: 2020-06-19 | Discharge: 2020-06-19 | Disposition: A | Discharge status: Home / Self Care | Payer: BC Managed Care – PPO | Source: Ambulatory Visit | Attending: Neurological Surgery | Admitting: Neurological Surgery

## 2020-06-19 ENCOUNTER — Other Ambulatory Visit: Payer: Self-pay

## 2020-06-19 ENCOUNTER — Other Ambulatory Visit (HOSPITAL_COMMUNITY)
Admission: RE | Admit: 2020-06-19 | Discharge: 2020-06-19 | Disposition: A | Discharge status: Home / Self Care | Payer: BC Managed Care – PPO | Source: Ambulatory Visit | Attending: Neurological Surgery | Admitting: Neurological Surgery

## 2020-06-19 ENCOUNTER — Encounter (HOSPITAL_COMMUNITY): Payer: Self-pay

## 2020-06-19 DIAGNOSIS — M4326 Fusion of spine, lumbar region: Secondary | ICD-10-CM | POA: Diagnosis not present

## 2020-06-19 DIAGNOSIS — M48061 Spinal stenosis, lumbar region without neurogenic claudication: Secondary | ICD-10-CM

## 2020-06-19 DIAGNOSIS — M4316 Spondylolisthesis, lumbar region: Secondary | ICD-10-CM | POA: Diagnosis not present

## 2020-06-19 DIAGNOSIS — M5117 Intervertebral disc disorders with radiculopathy, lumbosacral region: Secondary | ICD-10-CM | POA: Diagnosis not present

## 2020-06-19 DIAGNOSIS — Z01812 Encounter for preprocedural laboratory examination: Secondary | ICD-10-CM | POA: Diagnosis not present

## 2020-06-19 DIAGNOSIS — M5137 Other intervertebral disc degeneration, lumbosacral region: Secondary | ICD-10-CM | POA: Diagnosis not present

## 2020-06-19 DIAGNOSIS — G8929 Other chronic pain: Secondary | ICD-10-CM | POA: Diagnosis not present

## 2020-06-19 DIAGNOSIS — M4807 Spinal stenosis, lumbosacral region: Secondary | ICD-10-CM | POA: Diagnosis not present

## 2020-06-19 DIAGNOSIS — M4317 Spondylolisthesis, lumbosacral region: Secondary | ICD-10-CM | POA: Diagnosis not present

## 2020-06-19 DIAGNOSIS — Z01818 Encounter for other preprocedural examination: Secondary | ICD-10-CM | POA: Diagnosis not present

## 2020-06-19 DIAGNOSIS — Z96652 Presence of left artificial knee joint: Secondary | ICD-10-CM | POA: Diagnosis not present

## 2020-06-19 DIAGNOSIS — K429 Umbilical hernia without obstruction or gangrene: Secondary | ICD-10-CM | POA: Diagnosis not present

## 2020-06-19 DIAGNOSIS — Z20822 Contact with and (suspected) exposure to covid-19: Secondary | ICD-10-CM | POA: Diagnosis not present

## 2020-06-19 DIAGNOSIS — M961 Postlaminectomy syndrome, not elsewhere classified: Secondary | ICD-10-CM | POA: Diagnosis not present

## 2020-06-19 DIAGNOSIS — K219 Gastro-esophageal reflux disease without esophagitis: Secondary | ICD-10-CM | POA: Diagnosis not present

## 2020-06-19 DIAGNOSIS — Z96642 Presence of left artificial hip joint: Secondary | ICD-10-CM | POA: Diagnosis not present

## 2020-06-19 DIAGNOSIS — Z981 Arthrodesis status: Secondary | ICD-10-CM | POA: Diagnosis not present

## 2020-06-19 DIAGNOSIS — M1990 Unspecified osteoarthritis, unspecified site: Secondary | ICD-10-CM | POA: Diagnosis not present

## 2020-06-19 HISTORY — DX: Gastro-esophageal reflux disease without esophagitis: K21.9

## 2020-06-19 HISTORY — DX: Personal history of urinary calculi: Z87.442

## 2020-06-19 LAB — BASIC METABOLIC PANEL
Anion gap: 8 (ref 5–15)
BUN: 23 mg/dL — ABNORMAL HIGH (ref 6–20)
CO2: 25 mmol/L (ref 22–32)
Calcium: 9 mg/dL (ref 8.9–10.3)
Chloride: 108 mmol/L (ref 98–111)
Creatinine, Ser: 1.13 mg/dL (ref 0.61–1.24)
GFR, Estimated: >60 mL/min (ref >60)
Glucose, Bld: 83 mg/dL (ref 70–99)
Potassium: 4.2 mmol/L (ref 3.5–5.1)
Sodium: 141 mmol/L (ref 135–145)

## 2020-06-19 LAB — SURGICAL PCR SCREEN
MRSA, PCR: NEGATIVE
Staphylococcus aureus: POSITIVE — AB

## 2020-06-19 LAB — PROTIME-INR
INR: 1.1 (ref 0.8–1.2)
Prothrombin Time: 13.4 seconds (ref 11.4–15.2)

## 2020-06-19 LAB — SARS CORONAVIRUS 2 (TAT 6-24 HRS): SARS Coronavirus 2: NEGATIVE

## 2020-06-19 LAB — CBC WITH DIFFERENTIAL/PLATELET
Abs Immature Granulocytes: 0.02 10*3/uL (ref 0.00–0.07)
Basophils Absolute: 0 10*3/uL (ref 0.0–0.1)
Basophils Relative: 1 %
Eosinophils Absolute: 0.1 10*3/uL (ref 0.0–0.5)
Eosinophils Relative: 2 %
HCT: 43.6 % (ref 39.0–52.0)
Hemoglobin: 14.5 g/dL (ref 13.0–17.0)
Immature Granulocytes: 0 %
Lymphocytes Relative: 36 %
Lymphs Abs: 1.9 10*3/uL (ref 0.7–4.0)
MCH: 30.9 pg (ref 26.0–34.0)
MCHC: 33.3 g/dL (ref 30.0–36.0)
MCV: 92.8 fL (ref 80.0–100.0)
Monocytes Absolute: 0.6 10*3/uL (ref 0.1–1.0)
Monocytes Relative: 11 %
Neutro Abs: 2.6 10*3/uL (ref 1.7–7.7)
Neutrophils Relative %: 50 %
Platelets: 180 10*3/uL (ref 150–400)
RBC: 4.7 MIL/uL (ref 4.22–5.81)
RDW: 12.5 % (ref 11.5–15.5)
WBC: 5.2 10*3/uL (ref 4.0–10.5)
nRBC: 0 % (ref 0.0–0.2)

## 2020-06-19 LAB — TYPE AND SCREEN
ABO/RH(D): O NEG
Antibody Screen: NEGATIVE

## 2020-06-19 NOTE — Progress Notes (Signed)
PCP - Dr. Wylene Simmer  Chest x-ray - today EKG - today  COVID TEST- done today -  Result pending   Anesthesia review: No  Patient denies shortness of breath, fever, cough and chest pain at PAT appointment   All instructions explained to the patient, with a verbal understanding of the material. Patient agrees to go over the instructions while at home for a better understanding. Patient also instructed to self quarantine after being tested for COVID-19. The opportunity to ask questions was provided.

## 2020-06-19 NOTE — Pre-Procedure Instructions (Signed)
SIDI DZIKOWSKI  06/19/2020    Your procedure is scheduled on Monday, June 23, 2020 at 8:30 AM.   Report to [facility] Entrance "A" Admitting Office at 5:30 AM.   Call this number if you have problems the morning of surgery: [phone]   Remember:  Do not eat or drink after midnight Sunday, 06/22/20.  Take these medicines the morning of surgery with A SIP OF WATER: Pantoprazole (Protonix) - if needed, Fexofenadine (Allegra) - if needed, Hydrocodone (Vicodin) - if needed  Stop NSAIDS (Naproxen, Aleve, Ibuprofen, etc) as of today prior to surgery. Do not use Aspirin containing products, Multivitamins, Herbal medications or Fish Oil prior to surgery.    Do not wear jewelry.  Do not wear lotions, powders, cologne or deodorant.  Men may shave face and neck.  Do not bring valuables to the hospital.  [facility] is not responsible for any belongings or valuables.  Contacts, dentures or bridgework may not be worn into surgery.  Leave your suitcase in the car.  After surgery it may be brought to your room.  For patients admitted to the hospital, discharge time will be determined by your treatment team.  [facility] - Preparing for Surgery  Before surgery, you can play an important role.  Because skin is not sterile, your skin needs to be as free of germs as possible.  You can reduce the number of germs on you skin by washing with CHG (chlorahexidine gluconate) soap before surgery.  CHG is an antiseptic cleaner which kills germs and bonds with the skin to continue killing germs even after washing.  Oral Hygiene is also important in reducing the risk of infection.  Remember to brush your teeth with your regular toothpaste the morning of surgery.  Please DO NOT use if you have an allergy to CHG or antibacterial soaps.  If your skin becomes reddened/irritated stop using the CHG and inform your nurse when you arrive at Short Stay.  Do not shave (including legs and underarms) for at least  48 hours prior to the first CHG shower.  You may shave your face.  Please follow these instructions carefully:   1.  Shower with CHG Soap the night before surgery and the morning of Surgery.  2.  If you choose to wash your hair, wash your hair first as usual with your normal shampoo.  3.  After you shampoo, rinse your hair and body thoroughly to remove the shampoo. 4.  Use CHG as you would any other liquid soap.  You can apply chg directly to the skin and wash gently with a      scrungie or washcloth.           5.  Apply the CHG Soap to your body ONLY FROM THE NECK DOWN.   Do not use on open wounds or open sores. Avoid contact with your eyes, ears, mouth and genitals (private parts).  Wash genitals (private parts) with your normal soap - do this prior to using CHG soap.  6.  Wash thoroughly, paying special attention to the area where your surgery will be performed.  7.  Thoroughly rinse your body with warm water from the neck down.  8.  DO NOT shower/wash with your normal soap after using and rinsing off the CHG Soap.  9.  Pat yourself dry with a clean towel.            10 .  Wear clean pajamas.  11.  Place clean sheets on your bed the night of your first shower and do not sleep with pets.  Day of Surgery  Shower as above. Do not apply any lotions/deodorants the morning of surgery.   Please wear clean clothes to the hospital. Remember to brush your teeth with toothpaste.  Please read over the fact sheets that you were given.

## 2020-06-22 NOTE — Anesthesia Preprocedure Evaluation (Addendum)
Anesthesia Evaluation  Patient identified by MRN, date of birth, ID band Patient awake    Reviewed: Allergy & Precautions, NPO status , Patient's Chart, lab work & pertinent test results  History of Anesthesia Complications Negative for: history of anesthetic complications  Airway Mallampati: II  TM Distance: >3 FB Neck ROM: Full    Dental  (+) Dental Advisory Given, Teeth Intact   Pulmonary neg pulmonary ROS,  06/19/2020 SARS coronavirus NEG   breath sounds clear to auscultation       Cardiovascular negative cardio ROS   Rhythm:Regular Rate:Normal     Neuro/Psych Chronic back pain    GI/Hepatic Neg liver ROS, GERD  Medicated and Controlled,  Endo/Other  negative endocrine ROS  Renal/GU negative Renal ROS     Musculoskeletal  (+) Arthritis ,   Abdominal   Peds  Hematology negative hematology ROS (+)   Anesthesia Other Findings   Reproductive/Obstetrics                            Anesthesia Physical Anesthesia Plan  ASA: II  Anesthesia Plan: General   Post-op Pain Management:    Induction: Intravenous  PONV Risk Score and Plan: Ondansetron, Dexamethasone and Scopolamine patch - Pre-op  Airway Management Planned: Oral ETT  Additional Equipment: None  Intra-op Plan:   Post-operative Plan: Extubation in OR  Informed Consent: I have reviewed the patients History and Physical, chart, labs and discussed the procedure including the risks, benefits and alternatives for the proposed anesthesia with the patient or authorized representative who has indicated his/her understanding and acceptance.     Dental advisory given  Plan Discussed with: CRNA and Surgeon  Anesthesia Plan Comments:        Anesthesia Quick Evaluation

## 2020-06-23 ENCOUNTER — Inpatient Hospital Stay (HOSPITAL_COMMUNITY): Payer: BC Managed Care – PPO | Admitting: Vascular Surgery

## 2020-06-23 ENCOUNTER — Encounter (HOSPITAL_COMMUNITY)
Admission: RE | Disposition: A | Discharge status: Home / Self Care | Payer: Self-pay | Source: Home / Self Care | Attending: Neurological Surgery

## 2020-06-23 ENCOUNTER — Encounter (HOSPITAL_COMMUNITY): Payer: Self-pay | Admitting: Neurological Surgery

## 2020-06-23 ENCOUNTER — Inpatient Hospital Stay (HOSPITAL_COMMUNITY)
Admission: RE | Admit: 2020-06-23 | Discharge: 2020-06-24 | DRG: 460 | Disposition: A | Discharge status: Home / Self Care | Payer: BC Managed Care – PPO | Attending: Neurological Surgery | Admitting: Neurological Surgery

## 2020-06-23 ENCOUNTER — Inpatient Hospital Stay (HOSPITAL_COMMUNITY): Payer: BC Managed Care – PPO

## 2020-06-23 ENCOUNTER — Other Ambulatory Visit: Payer: Self-pay

## 2020-06-23 ENCOUNTER — Inpatient Hospital Stay (HOSPITAL_COMMUNITY): Payer: BC Managed Care – PPO | Admitting: Anesthesiology

## 2020-06-23 DIAGNOSIS — G8929 Other chronic pain: Secondary | ICD-10-CM | POA: Diagnosis present

## 2020-06-23 DIAGNOSIS — M4316 Spondylolisthesis, lumbar region: Secondary | ICD-10-CM | POA: Diagnosis present

## 2020-06-23 DIAGNOSIS — Z20822 Contact with and (suspected) exposure to covid-19: Secondary | ICD-10-CM | POA: Diagnosis present

## 2020-06-23 DIAGNOSIS — Z96652 Presence of left artificial knee joint: Secondary | ICD-10-CM | POA: Diagnosis present

## 2020-06-23 DIAGNOSIS — M1990 Unspecified osteoarthritis, unspecified site: Secondary | ICD-10-CM | POA: Diagnosis present

## 2020-06-23 DIAGNOSIS — M4317 Spondylolisthesis, lumbosacral region: Secondary | ICD-10-CM | POA: Diagnosis not present

## 2020-06-23 DIAGNOSIS — M961 Postlaminectomy syndrome, not elsewhere classified: Secondary | ICD-10-CM | POA: Diagnosis not present

## 2020-06-23 DIAGNOSIS — K219 Gastro-esophageal reflux disease without esophagitis: Secondary | ICD-10-CM | POA: Diagnosis present

## 2020-06-23 DIAGNOSIS — Z981 Arthrodesis status: Secondary | ICD-10-CM

## 2020-06-23 DIAGNOSIS — Z96642 Presence of left artificial hip joint: Secondary | ICD-10-CM | POA: Diagnosis present

## 2020-06-23 DIAGNOSIS — Z419 Encounter for procedure for purposes other than remedying health state, unspecified: Secondary | ICD-10-CM

## 2020-06-23 DIAGNOSIS — M4807 Spinal stenosis, lumbosacral region: Secondary | ICD-10-CM | POA: Diagnosis present

## 2020-06-23 DIAGNOSIS — M5117 Intervertebral disc disorders with radiculopathy, lumbosacral region: Principal | ICD-10-CM | POA: Diagnosis present

## 2020-06-23 DIAGNOSIS — M4326 Fusion of spine, lumbar region: Secondary | ICD-10-CM | POA: Diagnosis not present

## 2020-06-23 DIAGNOSIS — M5137 Other intervertebral disc degeneration, lumbosacral region: Secondary | ICD-10-CM | POA: Diagnosis not present

## 2020-06-23 HISTORY — PX: ANTERIOR LUMBAR FUSION: SHX1170

## 2020-06-23 HISTORY — PX: ABDOMINAL EXPOSURE: SHX5708

## 2020-06-23 SURGERY — ANTERIOR LUMBAR FUSION 1 LEVEL
Anesthesia: General

## 2020-06-23 MED ORDER — THROMBIN 5000 UNITS EX SOLR
CUTANEOUS | Status: AC
  Filled 2020-06-23: qty 5000

## 2020-06-23 MED ORDER — POTASSIUM CHLORIDE IN NACL 20-0.9 MEQ/L-% IV SOLN: INTRAVENOUS | Status: DC

## 2020-06-23 MED ORDER — FENTANYL CITRATE (PF) 250 MCG/5ML IJ SOLN
INTRAMUSCULAR | Status: AC
  Filled 2020-06-23: qty 5

## 2020-06-23 MED ORDER — THROMBIN 20000 UNITS EX SOLR: CUTANEOUS | Status: DC | PRN

## 2020-06-23 MED ORDER — PHENOL 1.4 % MT LIQD: 1.0000 | OROMUCOSAL | Status: DC | PRN

## 2020-06-23 MED ORDER — LIDOCAINE 2% (20 MG/ML) 5 ML SYRINGE
INTRAMUSCULAR | Status: DC | PRN
  Administered 2020-06-23: 60 mg via INTRAVENOUS

## 2020-06-23 MED ORDER — HYDROMORPHONE HCL 1 MG/ML IJ SOLN
0.2500 mg | INTRAMUSCULAR | Status: DC | PRN
  Administered 2020-06-23 (×3): 0.5 mg via INTRAVENOUS

## 2020-06-23 MED ORDER — CHLORHEXIDINE GLUCONATE 4 % EX LIQD: 60.0000 mL | Freq: Once | CUTANEOUS | Status: DC

## 2020-06-23 MED ORDER — SUGAMMADEX SODIUM 200 MG/2ML IV SOLN
INTRAVENOUS | Status: DC | PRN
  Administered 2020-06-23: 200 mg via INTRAVENOUS

## 2020-06-23 MED ORDER — TRAZODONE HCL 50 MG PO TABS
50.0000 mg | ORAL_TABLET | Freq: Every evening | ORAL | Status: DC | PRN
  Administered 2020-06-23: 50 mg via ORAL
  Filled 2020-06-23 (×2): qty 1

## 2020-06-23 MED ORDER — HYDROMORPHONE HCL 1 MG/ML IJ SOLN
INTRAMUSCULAR | Status: AC
  Filled 2020-06-23: qty 1

## 2020-06-23 MED ORDER — ONDANSETRON HCL 4 MG/2ML IJ SOLN
INTRAMUSCULAR | Status: DC | PRN
  Administered 2020-06-23: 4 mg via INTRAVENOUS

## 2020-06-23 MED ORDER — MIDAZOLAM HCL 2 MG/2ML IJ SOLN
INTRAMUSCULAR | Status: AC
  Filled 2020-06-23: qty 2

## 2020-06-23 MED ORDER — SENNA 8.6 MG PO TABS
1.0000 | ORAL_TABLET | Freq: Two times a day (BID) | ORAL | Status: DC
  Administered 2020-06-23 – 2020-06-24 (×2): 8.6 mg via ORAL
  Filled 2020-06-23 (×2): qty 1

## 2020-06-23 MED ORDER — ORAL CARE MOUTH RINSE: 15.0000 mL | Freq: Once | OROMUCOSAL | Status: AC

## 2020-06-23 MED ORDER — HYDROMORPHONE HCL 1 MG/ML IJ SOLN: 0.5000 mg | INTRAMUSCULAR | Status: DC | PRN

## 2020-06-23 MED ORDER — OXYCODONE HCL 5 MG/5ML PO SOLN: 5.0000 mg | Freq: Once | ORAL | Status: DC | PRN

## 2020-06-23 MED ORDER — THROMBIN 20000 UNITS EX SOLR
CUTANEOUS | Status: AC
  Filled 2020-06-23: qty 20000

## 2020-06-23 MED ORDER — METHOCARBAMOL 1000 MG/10ML IJ SOLN
500.0000 mg | Freq: Four times a day (QID) | INTRAVENOUS | Status: DC | PRN
  Filled 2020-06-23: qty 5

## 2020-06-23 MED ORDER — SODIUM CHLORIDE 0.9 % IV SOLN
250.0000 mL | INTRAVENOUS | Status: DC
  Administered 2020-06-23: 250 mL via INTRAVENOUS

## 2020-06-23 MED ORDER — FENTANYL CITRATE (PF) 250 MCG/5ML IJ SOLN
INTRAMUSCULAR | Status: DC | PRN
  Administered 2020-06-23: 200 ug via INTRAVENOUS
  Administered 2020-06-23 (×4): 50 ug via INTRAVENOUS

## 2020-06-23 MED ORDER — CHLORHEXIDINE GLUCONATE CLOTH 2 % EX PADS: 6.0000 | MEDICATED_PAD | Freq: Once | CUTANEOUS | Status: DC

## 2020-06-23 MED ORDER — ROCURONIUM BROMIDE 10 MG/ML (PF) SYRINGE
PREFILLED_SYRINGE | INTRAVENOUS | Status: DC | PRN
  Administered 2020-06-23: 20 mg via INTRAVENOUS
  Administered 2020-06-23: 30 mg via INTRAVENOUS
  Administered 2020-06-23: 80 mg via INTRAVENOUS
  Administered 2020-06-23: 20 mg via INTRAVENOUS

## 2020-06-23 MED ORDER — CEFAZOLIN SODIUM-DEXTROSE 2-4 GM/100ML-% IV SOLN
2.0000 g | INTRAVENOUS | Status: AC
  Administered 2020-06-23: 2 g via INTRAVENOUS
  Filled 2020-06-23: qty 100

## 2020-06-23 MED ORDER — SODIUM CHLORIDE 0.9% FLUSH: 3.0000 mL | INTRAVENOUS | Status: DC | PRN

## 2020-06-23 MED ORDER — ONDANSETRON HCL 4 MG/2ML IJ SOLN: 4.0000 mg | Freq: Four times a day (QID) | INTRAMUSCULAR | Status: DC | PRN

## 2020-06-23 MED ORDER — 0.9 % SODIUM CHLORIDE (POUR BTL) OPTIME
TOPICAL | Status: DC | PRN
  Administered 2020-06-23: 1000 mL

## 2020-06-23 MED ORDER — DEXAMETHASONE SODIUM PHOSPHATE 10 MG/ML IJ SOLN
10.0000 mg | Freq: Once | INTRAMUSCULAR | Status: AC
  Administered 2020-06-23: 10 mg via INTRAVENOUS
  Filled 2020-06-23: qty 1

## 2020-06-23 MED ORDER — ACETAMINOPHEN 650 MG RE SUPP: 650.0000 mg | RECTAL | Status: DC | PRN

## 2020-06-23 MED ORDER — SODIUM CHLORIDE 0.9% FLUSH
3.0000 mL | Freq: Two times a day (BID) | INTRAVENOUS | Status: DC
  Administered 2020-06-23 (×2): 3 mL via INTRAVENOUS

## 2020-06-23 MED ORDER — PROMETHAZINE HCL 25 MG/ML IJ SOLN: 6.2500 mg | INTRAMUSCULAR | Status: DC | PRN

## 2020-06-23 MED ORDER — CHLORHEXIDINE GLUCONATE 0.12 % MT SOLN
15.0000 mL | Freq: Once | OROMUCOSAL | Status: AC
  Administered 2020-06-23: 15 mL via OROMUCOSAL
  Filled 2020-06-23: qty 15

## 2020-06-23 MED ORDER — ACETAMINOPHEN 500 MG PO TABS
1000.0000 mg | ORAL_TABLET | ORAL | Status: AC
  Administered 2020-06-23: 1000 mg via ORAL
  Filled 2020-06-23: qty 2

## 2020-06-23 MED ORDER — MIDAZOLAM HCL 2 MG/2ML IJ SOLN: 0.5000 mg | Freq: Once | INTRAMUSCULAR | Status: DC | PRN

## 2020-06-23 MED ORDER — MEPERIDINE HCL 25 MG/ML IJ SOLN: 6.2500 mg | INTRAMUSCULAR | Status: DC | PRN

## 2020-06-23 MED ORDER — OXYCODONE HCL 5 MG PO TABS: 5.0000 mg | ORAL_TABLET | Freq: Once | ORAL | Status: DC | PRN

## 2020-06-23 MED ORDER — LACTATED RINGERS IV SOLN: INTRAVENOUS | Status: DC

## 2020-06-23 MED ORDER — GABAPENTIN 300 MG PO CAPS
300.0000 mg | ORAL_CAPSULE | ORAL | Status: AC
  Administered 2020-06-23: 300 mg via ORAL
  Filled 2020-06-23: qty 1

## 2020-06-23 MED ORDER — OXYCODONE HCL 5 MG PO TABS
10.0000 mg | ORAL_TABLET | ORAL | Status: DC | PRN
Start: 2020-06-23 — End: 2020-06-24
  Administered 2020-06-23 – 2020-06-24 (×6): 10 mg via ORAL
  Filled 2020-06-23 (×7): qty 2

## 2020-06-23 MED ORDER — LACTATED RINGERS IV SOLN: INTRAVENOUS | Status: DC | PRN

## 2020-06-23 MED ORDER — CEFAZOLIN SODIUM-DEXTROSE 2-4 GM/100ML-% IV SOLN
2.0000 g | Freq: Three times a day (TID) | INTRAVENOUS | Status: AC
  Administered 2020-06-23 (×2): 2 g via INTRAVENOUS
  Filled 2020-06-23 (×2): qty 100

## 2020-06-23 MED ORDER — METHOCARBAMOL 500 MG PO TABS
ORAL_TABLET | ORAL | Status: AC
  Filled 2020-06-23: qty 1

## 2020-06-23 MED ORDER — THROMBIN 5000 UNITS EX SOLR: OROMUCOSAL | Status: DC | PRN

## 2020-06-23 MED ORDER — ACETAMINOPHEN 325 MG PO TABS: 650.0000 mg | ORAL_TABLET | ORAL | Status: DC | PRN

## 2020-06-23 MED ORDER — ALUM & MAG HYDROXIDE-SIMETH 200-200-20 MG/5ML PO SUSP: 30.0000 mL | Freq: Four times a day (QID) | ORAL | Status: DC | PRN

## 2020-06-23 MED ORDER — ONDANSETRON HCL 4 MG PO TABS: 4.0000 mg | ORAL_TABLET | Freq: Four times a day (QID) | ORAL | Status: DC | PRN

## 2020-06-23 MED ORDER — PROPOFOL 10 MG/ML IV BOLUS
INTRAVENOUS | Status: AC
  Filled 2020-06-23: qty 20

## 2020-06-23 MED ORDER — PROPOFOL 10 MG/ML IV BOLUS
INTRAVENOUS | Status: DC | PRN
  Administered 2020-06-23: 200 mg via INTRAVENOUS

## 2020-06-23 MED ORDER — METHOCARBAMOL 500 MG PO TABS
500.0000 mg | ORAL_TABLET | Freq: Four times a day (QID) | ORAL | Status: DC | PRN
  Administered 2020-06-23 – 2020-06-24 (×3): 500 mg via ORAL
  Filled 2020-06-23 (×3): qty 1

## 2020-06-23 MED ORDER — MENTHOL 3 MG MT LOZG
1.0000 | LOZENGE | OROMUCOSAL | Status: DC | PRN
Start: 2020-06-23 — End: 2020-06-24
  Administered 2020-06-23: 3 mg via ORAL
  Filled 2020-06-23: qty 9

## 2020-06-23 MED ORDER — SCOPOLAMINE 1 MG/3DAYS TD PT72
1.0000 | MEDICATED_PATCH | TRANSDERMAL | Status: DC
  Administered 2020-06-23: 1.5 mg via TRANSDERMAL
  Filled 2020-06-23: qty 1

## 2020-06-23 MED ORDER — CELECOXIB 200 MG PO CAPS
200.0000 mg | ORAL_CAPSULE | Freq: Two times a day (BID) | ORAL | Status: DC
  Administered 2020-06-23 – 2020-06-24 (×3): 200 mg via ORAL
  Filled 2020-06-23 (×3): qty 1

## 2020-06-23 MED ORDER — PANTOPRAZOLE SODIUM 40 MG IV SOLR
40.0000 mg | Freq: Every day | INTRAVENOUS | Status: DC
  Administered 2020-06-23: 40 mg via INTRAVENOUS
  Filled 2020-06-23: qty 40

## 2020-06-23 MED ORDER — MIDAZOLAM HCL 2 MG/2ML IJ SOLN
INTRAMUSCULAR | Status: DC | PRN
  Administered 2020-06-23: 2 mg via INTRAVENOUS

## 2020-06-23 SURGICAL SUPPLY — 76 items
ANCHOR LUMBAR MIS 30 (Anchor) ×3 IMPLANT
APPLIER CLIP 11 MED OPEN (CLIP) ×2
BUR BARREL STRAIGHT FLUTE 4.0 (BURR) ×1 IMPLANT
CANISTER SUCT 3000ML PPV (MISCELLANEOUS) ×2 IMPLANT
CLIP APPLIE 11 MED OPEN (CLIP) ×1 IMPLANT
CLIP LIGATING EXTRA MED SLVR (CLIP) ×2 IMPLANT
CLIP LIGATING EXTRA SM BLUE (MISCELLANEOUS) ×2 IMPLANT
COVER WAND RF STERILE (DRAPES) ×2 IMPLANT
DERMABOND ADVANCED (GAUZE/BANDAGES/DRESSINGS) ×1
DERMABOND ADVANCED .7 DNX12 (GAUZE/BANDAGES/DRESSINGS) ×1 IMPLANT
DIFFUSER DRILL AIR PNEUMATIC (MISCELLANEOUS) IMPLANT
DRAPE C-ARM 42X72 X-RAY (DRAPES) ×5 IMPLANT
DRAPE LAPAROTOMY 100X72X124 (DRAPES) ×2 IMPLANT
DURAPREP 26ML APPLICATOR (WOUND CARE) ×2 IMPLANT
ELECT BLADE 4.0 EZ CLEAN MEGAD (MISCELLANEOUS) ×4
ELECT REM PT RETURN 9FT ADLT (ELECTROSURGICAL) ×2
ELECTRODE BLDE 4.0 EZ CLN MEGD (MISCELLANEOUS) ×2 IMPLANT
ELECTRODE REM PT RTRN 9FT ADLT (ELECTROSURGICAL) ×1 IMPLANT
GAUZE 4X4 16PLY RFD (DISPOSABLE) IMPLANT
GLOVE BIO SURGEON STRL SZ7.5 (GLOVE) ×2 IMPLANT
GLOVE BIO SURGEON STRL SZ8 (GLOVE) ×4 IMPLANT
GLOVE BIOGEL PI IND STRL 7.0 (GLOVE) IMPLANT
GLOVE BIOGEL PI IND STRL 8 (GLOVE) ×1 IMPLANT
GLOVE BIOGEL PI INDICATOR 7.0 (GLOVE) ×2
GLOVE BIOGEL PI INDICATOR 8 (GLOVE) ×1
GLOVE SS BIOGEL STRL SZ 7.5 (GLOVE) ×1 IMPLANT
GLOVE SUPERSENSE BIOGEL SZ 7.5 (GLOVE) ×1
GOWN STRL REUS W/ TWL LRG LVL3 (GOWN DISPOSABLE) IMPLANT
GOWN STRL REUS W/ TWL XL LVL3 (GOWN DISPOSABLE) ×1 IMPLANT
GOWN STRL REUS W/TWL 2XL LVL3 (GOWN DISPOSABLE) ×3 IMPLANT
GOWN STRL REUS W/TWL LRG LVL3 (GOWN DISPOSABLE) ×2
GOWN STRL REUS W/TWL XL LVL3 (GOWN DISPOSABLE) ×8
GRAFT TRINITY ELITE LGE HUMAN (Tissue) ×1 IMPLANT
HEMOSTAT POWDER KIT SURGIFOAM (HEMOSTASIS) ×1 IMPLANT
HEMOSTAT SNOW SURGICEL 2X4 (HEMOSTASIS) IMPLANT
INSERT FOGARTY 61MM (MISCELLANEOUS) IMPLANT
INSERT FOGARTY SM (MISCELLANEOUS) IMPLANT
KIT BASIN OR (CUSTOM PROCEDURE TRAY) ×2 IMPLANT
KIT TURNOVER KIT B (KITS) ×2 IMPLANT
LOOP VESSEL MAXI BLUE (MISCELLANEOUS) IMPLANT
LOOP VESSEL MINI RED (MISCELLANEOUS) IMPLANT
NDL SPNL 18GX3.5 QUINCKE PK (NEEDLE) ×1 IMPLANT
NEEDLE SPNL 18GX3.5 QUINCKE PK (NEEDLE) ×2 IMPLANT
NS IRRIG 1000ML POUR BTL (IV SOLUTION) ×2 IMPLANT
PACK LAMINECTOMY NEURO (CUSTOM PROCEDURE TRAY) ×2 IMPLANT
PAD ARMBOARD 7.5X6 YLW CONV (MISCELLANEOUS) ×4 IMPLANT
SPACER HEDRON IA 29X39 13 15D (Spacer) ×1 IMPLANT
SPONGE INTESTINAL PEANUT (DISPOSABLE) ×7 IMPLANT
SPONGE LAP 18X18 RF (DISPOSABLE) ×2 IMPLANT
SPONGE LAP 4X18 RFD (DISPOSABLE) IMPLANT
SPONGE SURGIFOAM ABS GEL 100 (HEMOSTASIS) ×1 IMPLANT
STAPLER VISISTAT 35W (STAPLE) IMPLANT
SUT PDS AB 1 CTX 36 (SUTURE) ×2 IMPLANT
SUT PROLENE 4 0 RB 1 (SUTURE)
SUT PROLENE 4-0 RB1 .5 CRCL 36 (SUTURE) IMPLANT
SUT PROLENE 5 0 CC1 (SUTURE) IMPLANT
SUT PROLENE 6 0 C 1 30 (SUTURE) ×2 IMPLANT
SUT PROLENE 6 0 CC (SUTURE) IMPLANT
SUT SILK 0 TIES 10X30 (SUTURE) ×2 IMPLANT
SUT SILK 2 0 TIES 10X30 (SUTURE) ×2 IMPLANT
SUT SILK 2 0SH CR/8 30 (SUTURE) IMPLANT
SUT SILK 3 0 TIES 17X18 (SUTURE) ×2
SUT SILK 3 0SH CR/8 30 (SUTURE) IMPLANT
SUT SILK 3-0 18XBRD TIE BLK (SUTURE) ×1 IMPLANT
SUT VIC AB 0 CT1 18XCR BRD8 (SUTURE) ×1 IMPLANT
SUT VIC AB 0 CT1 27 (SUTURE) ×4
SUT VIC AB 0 CT1 27XBRD ANBCTR (SUTURE) ×2 IMPLANT
SUT VIC AB 0 CT1 8-18 (SUTURE) ×2
SUT VIC AB 2-0 CP2 18 (SUTURE) ×2 IMPLANT
SUT VIC AB 3-0 SH 8-18 (SUTURE) ×2 IMPLANT
SUT VICRYL 4-0 PS2 18IN ABS (SUTURE) ×2 IMPLANT
TOWEL GREEN STERILE (TOWEL DISPOSABLE) ×4 IMPLANT
TOWEL GREEN STERILE FF (TOWEL DISPOSABLE) ×6 IMPLANT
TRAY FOL W/BAG SLVR 16FR STRL (SET/KITS/TRAYS/PACK) IMPLANT
TRAY FOLEY W/BAG SLVR 16FR LF (SET/KITS/TRAYS/PACK) ×2
WATER STERILE IRR 1000ML POUR (IV SOLUTION) ×2 IMPLANT

## 2020-06-23 NOTE — Anesthesia Postprocedure Evaluation (Signed)
Anesthesia Post Note  Patient: Harry Cobb  Procedure(s) Performed: Anterior Lumbar Interbody Fusion - Lumbar five-Sacral one (N/A ) ABDOMINAL EXPOSURE (N/A )     Patient location during evaluation: PACU Anesthesia Type: General Level of consciousness: awake and alert Pain management: pain level controlled Vital Signs Assessment: post-procedure vital signs reviewed and stable Respiratory status: spontaneous breathing, nonlabored ventilation and respiratory function stable Cardiovascular status: blood pressure returned to baseline and stable Postop Assessment: no apparent nausea or vomiting Anesthetic complications: no   No complications documented.  Last Vitals:  Vitals:   06/23/20 1225 06/23/20 1254  BP: 120/78 126/82  Pulse: 61 (!) 57  Resp: 15 20  Temp: 36.4 C 36.6 C  SpO2: 100% 98%    Last Pain:  Vitals:   06/23/20 1254  TempSrc: Oral  PainSc:                  Deangela Randleman,E. Naz Denunzio

## 2020-06-23 NOTE — Evaluation (Signed)
Occupational Therapy Evaluation Patient Details Name: Harry Cobb MRN: [id number] DOB: 11/24/1960 Today's Date: 06/23/2020    History of Present Illness 60 yo male s/p L5-S1 fusion. PMH including arthritis, back sx (2009), bicep tendon repair (2011), TKA left and right, , GERD.   Clinical Impression   PTA, pt was living with his wife and was independent. Currently, pt performing ADLs and functional mobility ADLs and functional mobility at Mod I - Independent level. Provided education and handout on back precautions, grooming, bed mobility, LB ADLs with AE, toileting, and shower transfer; pt demonstrated and verbalized understanding. Answered all pt questions. Recommend dc home once medically stable per physician. All acute OT needs met and will sign off. Thank you.    Follow Up Recommendations  No OT follow up;Supervision - Intermittent    Equipment Recommendations  None recommended by OT    Recommendations for Other Services       Precautions / Restrictions Precautions Precautions: Back Precaution Booklet Issued: Yes (comment) Precaution Comments: Provided back handout and reviewed precautions and compensatory techniques Required Braces or Orthoses: Other Brace Other Brace: No brace per Dr Ronnald Ramp      Mobility Bed Mobility Overal bed mobility: Needs Assistance Bed Mobility: Rolling;Sidelying to Sit;Sit to Sidelying Rolling: Supervision Sidelying to sit: Supervision     Sit to sidelying: Supervision General bed mobility comments: Education on log roll technique. Supervision for initial safety    Transfers Overall transfer level: Independent                    Balance Overall balance assessment: No apparent balance deficits (not formally assessed)                                         ADL either performed or assessed with clinical judgement   ADL Overall ADL's : Modified independent                                        General ADL Comments: Providing education on compensatory techniques for LB ADLs with AE, toileting, and functional transfers. Pt demonstrating and verbalizing understanding.     Vision Baseline Vision/History: Wears glasses Patient Visual Report: No change from baseline       Perception     Praxis      Pertinent Vitals/Pain Pain Assessment: Faces Faces Pain Scale: Hurts a little bit Pain Location: Incision site Pain Descriptors / Indicators: Constant;Discomfort;Grimacing Pain Intervention(s): Monitored during session;Limited activity within patient's tolerance;Repositioned;Premedicated before session     Hand Dominance Right   Extremity/Trunk Assessment Upper Extremity Assessment Upper Extremity Assessment: Overall WFL for tasks assessed   Lower Extremity Assessment Lower Extremity Assessment: Defer to PT evaluation   Cervical / Trunk Assessment Cervical / Trunk Assessment: Other exceptions Cervical / Trunk Exceptions: s/p L5-S1   Communication Communication Communication: No difficulties   Cognition Arousal/Alertness: Awake/alert Behavior During Therapy: WFL for tasks assessed/performed Overall Cognitive Status: Within Functional Limits for tasks assessed                                     General Comments  Wife present throughout    Exercises     Shoulder Instructions      Home  Living Family/patient expects to be discharged to:: Private residence Living Arrangements: Spouse/significant other Available Help at Discharge: Family Type of Home: House Home Access: Stairs to enter [employer] of Steps: 2 front; 6 back (has rail) Entrance Stairs-Rails: None Home Layout: Two level;Bed/bath upstairs Alternate Level Stairs-Number of Steps: Flight Alternate Level Stairs-Rails: Right Bathroom Shower/Tub: Occupational psychologist: Standard     Home Equipment: Civil engineer, contracting - built in          Prior Functioning/Environment  Level of Independence: Independent                 OT Problem List: Decreased activity tolerance;Decreased knowledge of precautions;Decreased knowledge of use of DME or AE;Pain;Decreased range of motion      OT Treatment/Interventions:      OT Goals(Current goals can be found in the care plan section) Acute Rehab OT Goals Patient Stated Goal: Go home OT Goal Formulation: All assessment and education complete, DC therapy  OT Frequency:     Barriers to D/C:            Co-evaluation              AM-PAC OT "6 Clicks" Daily Activity     Outcome Measure Help from another person eating meals?: None Help from another person taking care of personal grooming?: None Help from another person toileting, which includes using toliet, bedpan, or urinal?: None Help from another person bathing (including washing, rinsing, drying)?: None Help from another person to put on and taking off regular upper body clothing?: None Help from another person to put on and taking off regular lower body clothing?: None 6 Click Score: 24   End of Session Nurse Communication: Mobility status  Activity Tolerance: Patient tolerated treatment well Patient left: in bed;with call bell/phone within reach;with family/visitor present  OT Visit Diagnosis: Unsteadiness on feet (R26.81);Other abnormalities of gait and mobility (R26.89);Muscle weakness (generalized) (M62.81);Pain Pain - part of body:  (Incision site)                Time: 6812-7517 OT Time Calculation (min): 22 min Charges:  OT General Charges $OT Visit: 1 Visit OT Evaluation $OT Eval Low Complexity: 1 Low  Mixtli Reno MSOT, OTR/L Acute Rehab Pager: [phone] Office: [facility] 06/23/2020, 5:32 PM

## 2020-06-23 NOTE — H&P (Signed)
Subjective: Patient is a 60 y.o. male admitted for alif. Onset of symptoms was several months ago, gradually worsening since that time.  The pain is rated severe, and is located at the across the lower back and radiates to LLE. The pain is described as aching and occurs all day. The symptoms have been progressive. Symptoms are exacerbated by exercise. MRI or CT showed DDD, previous surgery L5-S1 x3.  Past Medical History:  Diagnosis Date  . Arthritis   . Back pain    radiates into the left posterior thigh to the calf.  Marland Kitchen GERD (gastroesophageal reflux disease)   . History of kidney stones   . Left shoulder pain   . Leg pain    Worse than back pain  . Lumbar foraminal stenosis   . No pertinent past medical history   . Radiculopathy, lumbar region     Past Surgical History:  Procedure Laterality Date  . BACK SURGERY  2009  . BICEPS TENDON REPAIR  2011  . EYE SURGERY     Lasik  . JOINT REPLACEMENT     both partial knee replacements  . KNEE ARTHROSCOPY  11/12   lt  . KNEE SURGERY  2009/2011   Rt and Lt knee replacment  . LASER SPINE SURGERY    . SCAR REVISION Left 08/03/2016   Procedure: Left hip open scar debridement ;  Surgeon: Paralee Cancel, MD;  Location: [facility];  Service: Orthopedics;  Laterality: Left;  . shoulder cyst  06/04/11   right cyst removed  . TONSILLECTOMY    . TOTAL HIP ARTHROPLASTY Left 08/12/2015   Procedure: LEFT TOTAL HIP ARTHROPLASTY ANTERIOR APPROACH;  Surgeon: Paralee Cancel, MD;  Location: [facility];  Service: Orthopedics;  Laterality: Left;  . TOTAL KNEE ARTHROPLASTY Left 11/20/2012   Procedure: CONVERT LEFT UNI KNEE ARTHROPLASTY TO LEFT TOTAL KNEE ARTHROPLASTY;  Surgeon: Mauri Pole, MD;  Location: [facility];  Service: Orthopedics;  Laterality: Left;  . TOTAL KNEE REVISION WITH SCAR DEBRIDEMENT/PATELLA REVISION WITH POLY EXCHANGE Left 08/03/2016   Procedure: Left knee open scar debridement, poly exchange ;  Surgeon: Paralee Cancel, MD;  Location: [facility];  Service:  Orthopedics;  Laterality: Left;  . UMBILICAL HERNIA REPAIR  06/04/2011   Procedure: HERNIA REPAIR UMBILICAL ADULT;  Surgeon: Rolm Bookbinder, MD;  Location: [facility];  Service: General;  Laterality: N/A;  umbilical hernia repiar and mesh and removal of lympoma on right shoulder    Prior to Admission medications   Medication Sig Start Date End Date Taking? Authorizing Provider  amoxicillin (AMOXIL) 500 MG capsule Take 2,000 mg by mouth See admin instructions. Before dental procedures   Yes [provider]  fexofenadine (ALLEGRA) 180 MG tablet Take 180 mg by mouth daily as needed for allergies or rhinitis.   Yes [provider]  HYDROcodone-acetaminophen (NORCO/VICODIN) 5-325 MG tablet Take 1 tablet by mouth every 6 (six) hours as needed for severe pain. 05/20/20  Yes [provider]  naproxen sodium (ALEVE) 220 MG tablet Take 440 mg by mouth 2 (two) times daily with a meal.   Yes [provider]  pantoprazole (PROTONIX) 40 MG tablet Take by mouth daily as needed (acid reflux).    [provider]   No Known Allergies  Social History   Tobacco Use  . Smoking status: Never Smoker  . Smokeless tobacco: Never Used  Substance Use Topics  . Alcohol use: Yes    Comment: occassionally    History reviewed. No pertinent  family history.   Review of Systems  Positive ROS: neg  All other systems have been reviewed and were otherwise negative with the exception of those mentioned in the HPI and as above.  Objective: Vital signs in last 24 hours: Temp:  [98.3 F (36.8 C)] 98.3 F (36.8 C) (01/03 0637) Pulse Rate:  [50] 50 (01/03 0637) Resp:  [18] 18 (01/03 0637) BP: (142)/(61) 142/61 (01/03 0637) SpO2:  [97 %] 97 % (01/03 0637) Weight:  [96.1 kg] 96.1 kg (01/03 0658)  General Appearance: Alert, cooperative, no distress, appears stated age Head: Normocephalic, without obvious abnormality, atraumatic Eyes: PERRL,  conjunctiva/corneas clear, EOM's intact    Neck: Supple, symmetrical, trachea midline Back: Symmetric, no curvature, ROM normal, no CVA tenderness Lungs:  respirations unlabored Heart: Regular rate and rhythm Abdomen: Soft, non-tender Extremities: Extremities normal, atraumatic, no cyanosis or edema Pulses: 2+ and symmetric all extremities Skin: Skin color, texture, turgor normal, no rashes or lesions  NEUROLOGIC:   Mental status: Alert and oriented x4,  no aphasia, good attention span, fund of knowledge, and memory Motor Exam - grossly normal Sensory Exam - grossly normal Reflexes: 1+ Coordination - grossly normal Gait - grossly normal Balance - grossly normal Cranial Nerves: I: smell Not tested  II: visual acuity  OS: nl    OD: nl  II: visual fields Full to confrontation  II: pupils Equal, round, reactive to light  III,VII: ptosis None  III,IV,VI: extraocular muscles  Full ROM  V: mastication Normal  V: facial light touch sensation  Normal  V,VII: corneal reflex  Present  VII: facial muscle function - upper  Normal  VII: facial muscle function - lower Normal  VIII: hearing Not tested  IX: soft palate elevation  Normal  IX,X: gag reflex Present  XI: trapezius strength  5/5  XI: sternocleidomastoid strength 5/5  XI: neck flexion strength  5/5  XII: tongue strength  Normal    Data Review Lab Results  Component Value Date   WBC 5.2 06/19/2020   HGB 14.5 06/19/2020   HCT 43.6 06/19/2020   MCV 92.8 06/19/2020   PLT 180 06/19/2020   Lab Results  Component Value Date   NA 141 06/19/2020   K 4.2 06/19/2020   CL 108 06/19/2020   CO2 25 06/19/2020   BUN 23 (H) 06/19/2020   CREATININE 1.13 06/19/2020   GLUCOSE 83 06/19/2020   Lab Results  Component Value Date   INR 1.1 06/19/2020    Assessment/Plan:  Estimated body mass index is 28.74 kg/m as calculated from the following:   Height as of this encounter: 6' (1.829 m).   Weight as of this encounter: 96.1  kg. Patient admitted for ALIF L5-S1. Patient has failed a reasonable attempt at conservative therapy.  I explained the condition and procedure to the patient and answered any questions.  Patient wishes to proceed with procedure as planned. Understands risks/ benefits and typical outcomes of procedure.   ELLISON RIETH 06/23/2020 8:20 AM

## 2020-06-23 NOTE — Anesthesia Procedure Notes (Signed)
Procedure Name: Intubation Date/Time: 06/23/2020 8:52 AM Performed by: Darletta Moll, CRNA Pre-anesthesia Checklist: Patient identified, Emergency Drugs available, Suction available and Patient being monitored Patient Re-evaluated:Patient Re-evaluated prior to induction Oxygen Delivery Method: Circle system utilized Preoxygenation: Pre-oxygenation with 100% oxygen Induction Type: IV induction Ventilation: Mask ventilation without difficulty and Oral airway inserted - appropriate to patient size Laryngoscope Size: Mac and 4 Grade View: Grade I Tube type: Oral Number of attempts: 1 Airway Equipment and Method: Stylet Placement Confirmation: ETT inserted through vocal cords under direct vision,  positive ETCO2 and breath sounds checked- equal and bilateral Secured at: 22 cm Tube secured with: Tape Dental Injury: Teeth and Oropharynx as per pre-operative assessment

## 2020-06-23 NOTE — Progress Notes (Signed)
06/23/2020 Reached out to Dr. Yetta Barre RE: clarification on type of post op brace for this pt.  Dr. Yetta Barre responded "not needed".  No brace needed at this time.  Order d/c'd.    Thanks,  Corinna Capra, PT, DPT  Acute Rehabilitation [phone] pager [phone] office

## 2020-06-23 NOTE — H&P (Signed)
History and Physical Interval Note:  06/23/2020 7:38 AM  Harry Cobb  has presented today for surgery, with the diagnosis of Stenosis.  The various methods of treatment have been discussed with the patient and family. After consideration of risks, benefits and other options for treatment, the patient has consented to  Procedure(s): ALIF - L5-S1 (N/A) ABDOMINAL EXPOSURE (N/A) as a surgical intervention.  The patient's history has been reviewed, patient examined, no change in status, stable for surgery.  I have reviewed the patient's chart and labs.  Questions were answered to the patient's satisfaction.    L5-S1 ALIF  Marty Heck  Patient name: Harry Cobb MRN: [id number]        DOB: Apr 16, 1961          Sex: male  REASON FOR CONSULT: Evaluate for L5-S1 ALIF  HPI: Harry Cobb is a 60 y.o. male, with history of chronic lower back pain with left leg radiculopathy that presents for L5-S1 ALIF evaluation.  Patient states he has had years of back pain.  He states he had a previous L4-L5 discectomy.  He has been trying to manage his current pain level with nonoperative management that unfortunately has been unsuccessful.  He can no longer play golf.  He has been evaluated by Dr. Ronnald Ramp who has recommended an L5-S1 ALIF.  Previous abdominal surgery includes a umbilical hernia repair in 2012 by Dr. Donne Hazel that was done open with mesh.  HE is retired from [employer].      Past Medical History:  Diagnosis Date  . Arthritis   . Back pain    radiates into the left posterior thigh to the calf.  . Left shoulder pain   . Leg pain    Worse than back pain  . Lumbar foraminal stenosis   . No pertinent past medical history   . Radiculopathy, lumbar region          Past Surgical History:  Procedure Laterality Date  . BACK SURGERY  2009  . BICEPS TENDON REPAIR  2011  . EYE SURGERY     Lasik  . JOINT REPLACEMENT     both partial knee replacements  . KNEE  ARTHROSCOPY  11/12   lt  . KNEE SURGERY  2009/2011   Rt and Lt knee replacment  . LASER SPINE SURGERY    . SCAR REVISION Left 08/03/2016   Procedure: Left hip open scar debridement ;  Surgeon: Paralee Cancel, MD;  Location: [facility];  Service: Orthopedics;  Laterality: Left;  . shoulder cyst  06/04/11   right cyst removed  . TONSILLECTOMY    . TOTAL HIP ARTHROPLASTY Left 08/12/2015   Procedure: LEFT TOTAL HIP ARTHROPLASTY ANTERIOR APPROACH;  Surgeon: Paralee Cancel, MD;  Location: [facility];  Service: Orthopedics;  Laterality: Left;  . TOTAL KNEE ARTHROPLASTY Left 11/20/2012   Procedure: CONVERT LEFT UNI KNEE ARTHROPLASTY TO LEFT TOTAL KNEE ARTHROPLASTY;  Surgeon: Mauri Pole, MD;  Location: [facility];  Service: Orthopedics;  Laterality: Left;  . TOTAL KNEE REVISION WITH SCAR DEBRIDEMENT/PATELLA REVISION WITH POLY EXCHANGE Left 08/03/2016   Procedure: Left knee open scar debridement, poly exchange ;  Surgeon: Paralee Cancel, MD;  Location: [facility];  Service: Orthopedics;  Laterality: Left;  . UMBILICAL HERNIA REPAIR  06/04/2011   Procedure: HERNIA REPAIR UMBILICAL ADULT;  Surgeon: Rolm Bookbinder, MD;  Location: [facility];  Service: General;  Laterality: N/A;  umbilical hernia repiar and mesh and removal of lympoma on right  shoulder    History reviewed. No pertinent family history.  SOCIAL HISTORY: Social History        Socioeconomic History  . Marital status: Married    Spouse name: Not on file  . Number of children: Not on file  . Years of education: Not on file  . Highest education level: Not on file  Occupational History  . Not on file  Tobacco Use  . Smoking status: Never Smoker  . Smokeless tobacco: Never Used  Vaping Use  . Vaping Use: Never used  Substance and Sexual Activity  . Alcohol use: Yes    Comment: occassionally  . Drug use: No  . Sexual activity: Not on file  Other Topics Concern  . Not on file  Social History Narrative  . Not  on file   Social Determinants of Health      Financial Resource Strain:   . Difficulty of Paying Living Expenses: Not on file  Food Insecurity:   . Worried About Programme researcher, broadcasting/film/video in the Last Year: Not on file  . Ran Out of Food in the Last Year: Not on file  Transportation Needs:   . Lack of Transportation (Medical): Not on file  . Lack of Transportation (Non-Medical): Not on file  Physical Activity:   . Days of Exercise per Week: Not on file  . Minutes of Exercise per Session: Not on file  Stress:   . Feeling of Stress : Not on file  Social Connections:   . Frequency of Communication with Friends and Family: Not on file  . Frequency of Social Gatherings with Friends and Family: Not on file  . Attends Religious Services: Not on file  . Active Member of Clubs or Organizations: Not on file  . Attends Banker Meetings: Not on file  . Marital Status: Not on file  Intimate Partner Violence:   . Fear of Current or Ex-Partner: Not on file  . Emotionally Abused: Not on file  . Physically Abused: Not on file  . Sexually Abused: Not on file    No Known Allergies        Current Outpatient Medications  Medication Sig Dispense Refill  . fexofenadine (ALLEGRA) 180 MG tablet Take 180 mg by mouth daily.    . Ibuprofen (ADVIL PO) Advil    . ipratropium (ATROVENT) 0.06 % nasal spray Place 2 sprays into both nostrils daily.  0  . naproxen (NAPROSYN) 500 MG tablet TAKE 1 TABLET(500 MG) BY MOUTH TWICE DAILY WITH A MEAL 60 tablet 2  . Adalimumab (HUMIRA) 40 MG/0.8ML PSKT Inject into the skin. (Patient not taking: Reported on 05/27/2020)    . azelastine (ASTELIN) 0.1 % nasal spray Place 2 sprays into both nostrils daily. (Patient not taking: Reported on 05/27/2020)  0  . gabapentin (NEURONTIN) 300 MG capsule Take 300 mg by mouth 3 (three) times daily. (Patient not taking: Reported on 05/27/2020)    . pantoprazole (PROTONIX) 40 MG tablet pantoprazole 40 mg  tablet,delayed release (Patient not taking: Reported on 05/27/2020)    . terbinafine (LAMISIL) 250 MG tablet Take 1 tablet (250 mg total) by mouth daily. (Patient not taking: Reported on 05/27/2020) 90 tablet 0   No current facility-administered medications for this visit.    REVIEW OF SYSTEMS:  [X]  denotes positive finding, [ ]  denotes negative finding Cardiac  Comments:  Chest pain or chest pressure:    Shortness of breath upon exertion:    Short of breath when lying flat:  Irregular heart rhythm:        Vascular    Pain in calf, thigh, or hip brought on by ambulation:    Pain in feet at night that wakes you up from your sleep:     Blood clot in your veins:    Leg swelling:         Pulmonary    Oxygen at home:    Productive cough:     Wheezing:         Neurologic    Sudden weakness in arms or legs:     Sudden numbness in arms or legs:     Sudden onset of difficulty speaking or slurred speech:    Temporary loss of vision in one eye:     Problems with dizziness:         Gastrointestinal    Blood in stool:     Vomited blood:         Genitourinary    Burning when urinating:     Blood in urine:        Psychiatric    Major depression:         Hematologic    Bleeding problems:    Problems with blood clotting too easily:        Skin    Rashes or ulcers:        Constitutional    Fever or chills:      PHYSICAL EXAM:    Vitals:   05/27/20 1423  BP: 129/85  Pulse: (!) 51  Resp: 18  Temp: 97.9 F (36.6 C)  TempSrc: Temporal  SpO2: 95%  Weight: 207 lb (93.9 kg)  Height: 5' 11.5" (1.816 m)    GENERAL: The patient is a well-nourished male, in no acute distress. The vital signs are documented above. CARDIAC: There is a regular rate and rhythm.  VASCULAR:  Palpable femoral pulses both groins Palpable DP pulses bilaterally PULMONARY: No respiratory  distress. ABDOMEN: Soft and non-tender. MUSCULOSKELETAL: There are no major deformities or cyanosis. NEUROLOGIC: No focal weakness or paresthesias are detected. SKIN: There are no ulcers or rashes noted. PSYCHIATRIC: The patient has a normal affect.  DATA:   I independently reviewed his MRI lumbar spine from 04/09/2020 and his iliac vein bifurcation appears to be at the L4-L5 disc base  Assessment/Plan:  61 year old male with chronic lower back pain and left leg radiculopathy that presents for pre-op evaluation of L5-S1 ALIF with Dr. Ronnald Ramp.  I reviewed his MRI lumbar spine and I think he would be a good candidate for anterior approach.  His only previous abdominal surgery is umbilical hernia repair and we should be able to stay away from this incision.  I discussed plans for transverse incision over the left rectus muscle entering the retroperitoneal space laterally and mobilizing the peritoneum and left ureter across midline and potentially mobilizing the left iliac artery and vein if needed.  We discussed risk of injury to the above structures.  Look forward to assisting Dr. Ronnald Ramp.   Marty Heck, MD [facility] Office: [phone]

## 2020-06-23 NOTE — Transfer of Care (Signed)
Immediate Anesthesia Transfer of Care Note  Patient: Harry Cobb  Procedure(s) Performed: Anterior Lumbar Interbody Fusion - Lumbar five-Sacral one (N/A ) ABDOMINAL EXPOSURE (N/A )  Patient Location: PACU  Anesthesia Type:General  Level of Consciousness: drowsy and patient cooperative  Airway & Oxygen Therapy: Patient Spontanous Breathing  Post-op Assessment: Report given to RN, Post -op Vital signs reviewed and stable and Patient moving all extremities X 4  Post vital signs: Reviewed and stable  Last Vitals:  Vitals Value Taken Time  BP 141/86 06/23/20 1110  Temp 36.7 C 06/23/20 1110  Pulse 58 06/23/20 1114  Resp 22 06/23/20 1114  SpO2 97 % 06/23/20 1114  Vitals shown include unvalidated device data.  Last Pain:  Vitals:   06/23/20 0658  PainSc: 2       Patients Stated Pain Goal: 2 (06/23/20 9485)  Complications: No complications documented.

## 2020-06-23 NOTE — Progress Notes (Addendum)
Orthopedic Tech Progress Note Patient Details:  Harry Cobb [birth year] [id number] PACU RN called requesting a BRACE for patient. Spoke with MD and he aid patient doesn't need brace Patient ID: Harry Cobb, male   DOB: 08-06-1960, 60 y.o.   MRN: [id number]   [name] 06/23/2020, 12:13 PM

## 2020-06-23 NOTE — Op Note (Signed)
Date: June 23, 2020  Preoperative diagnosis: Chronic lower back pain  Postoperative diagnosis: Same  Procedure: Anterior spine exposure at the L5-S1 disc space through anterior retroperitoneal approach for L5-S1 ALIF  Surgeon: Dr. Cephus Shelling, MD  Co-surgeon: Dr. Marikay Alar, MD  Indications: Patient is a 60 year old male with chronic lower back pain.  Ultimately he has failed conservative management has been evaluated by Dr. Yetta Barre with neurosurgery.  Dr. Yetta Barre recommended an anterior L5-S1 ALIF.  Vascular surgery was asked to assist with exposure from an anterior approach.  Risk benefits discussed  Findings: Transverse incision over the left rectus muscle and the left rectus muscle subsequently circumferentially mobilized and the retroperitoneal space was entered lateral to the muscle and the peritoneum and left ureter were mobilized across midline to expose the disc space.  Middle sacral vessels were divided between clips.  There were several large osteophytes anteriorly that ultimately made dissection very tedious and the left iliac vein with fairly scarred to the disc space.  Once we had adequate exposure a spinal needle was placed in the disc space and we confirmed on lateral fluoroscopy and we were at the L5-S1 space.  Anesthesia: General  Details: Patient was taken to the operating room after informed consent was obtained.  Placed on the operating table in the supine position and general endotracheal anesthesia was induced.  Ultimately fluoroscopic C-arm was used in the lateral position to identify the L5-S1 disc space that was marked on the anterior abdominal wall over the left rectus.  The abdominal wall was then prepped and draped in usual fashion.  Preoperative antibiotics were given.  A timeout was performed to identify patient, procedure and site.  Initially made a transverse incision over the left rectus muscle and dissected through the subcutaneous tissue with Bovie  cautery.  We did use cerebellar retractors for added visualization.  Once we got down to the anterior rectus sheath this was opened transversely with Bovie cautery.  Small flaps were made underneath the anterior rectus sheath and the rectus muscle circumferentially mobilized.  I then entered lateral to the rectus muscle and the peritoneum was mobilized across midline including the left ureter.  Dr. Yetta Barre used hand-held Wiley retractors to pull the peritoneum and left ureter across midline where I could identify the L5-S1 disc space in the retroperitoneum.  Ultimately I could see the left iliac artery and vein as well as the left psoas muscle.  Middle sacral vessels were then mobilized and divided between clips.  I was then able to mobilize on the right side of the disc space bluntly with a Kd and suction.  I did use a Balfour retractor for added visualization while Dr. Yetta Barre continued to hold manual retraction with Wiley retractors.  The left iliac vein was fairly scarred to the osteophyte that made dissection somewhat tedious.  After extensive dissection I finally had what I thought was enough room on each side of the disc base and a fixed Thompson retractor was brought on the field.  I used 150 reverse lips on each side of the disc space with fixed malleable retractors cranial caudal.  Spinal needle was placed in the disc space and we confirmed on lateral fluoroscopy that we were at the correct level.  Case was turned over to Dr. Yetta Barre.  Please see his dictation for the remainder the case.  Complication: None  Condition: Stable  Cephus Shelling, MD [facility] Office: [phone]   Cephus Shelling

## 2020-06-23 NOTE — Op Note (Signed)
06/23/2020  11:06 AM  PATIENT:  Harry Cobb  60 y.o. male  PRE-OPERATIVE DIAGNOSIS: Degenerative disc disease L5-S1, retrolisthesis L5-S1, failed back syndrome, back pain with radiculopathy  POST-OPERATIVE DIAGNOSIS:  same  PROCEDURE: Anterior lumbar interbody fusion L5-S1 utilizing porous titanium interbody cage packed with morselized allograft  SURGEON:  Marikay Alar, MD  Co-surgeon: Dr. Chestine Spore  ASSISTANTS: Verlin Dike, FNP  ANESTHESIA:   General  EBL: 150 ml  Total I/O In: 1100 [I.V.:1000; IV Piggyback:100] Out: 410 [Urine:260; Blood:150]  BLOOD ADMINISTERED: none  DRAINS: None  SPECIMEN:  none  INDICATION FOR PROCEDURE: This patient presented with back and leg pain after 3 previous microdiscectomies L5-S1. Imaging showed postlaminectomy degenerative disc disease and retrolisthesis with foraminal stenosis L5-S1. The patient tried conservative measures without relief. Pain was debilitating. Recommended anterior lumbar interbody fusion L5-S1. Patient understood the risks, benefits, and alternatives and potential outcomes and wished to proceed.  PROCEDURE DETAILS: The patient was taken to the operating room and after induction of adequate generalized endotracheal anesthesia he was placed in the supine position on the operating table.  His abdominal region was shaved and then cleaned and then prepped with DuraPrep and then draped in the usual sterile fashion.  The exposure was performed by Dr. Chestine Spore of vascular surgery and this will be described in a separate operative report.  Once the exposure was completed and the retractor was in place we placed a needle in the disc base and marked our midline with AP fluoroscopy.  We then confirmed the level with lateral fluoroscopy.  I then incised the disc base and perform the initial discectomy with pituitary rongeurs after releasing the disc from the endplates with a Cobb elevator.  I then distracted the disc base to 11 mm and drilled  the disc space with a high-speed drill to prepare the endplates for arthrodesis.  I drilled the corners and drilled the posterior osteophyte coming off of L5.  I prepared the endplates for arthrodesis by shaving them with the drill.  I was careful not to get into the endplates.  We then used an 11 mm medial 15 degree trial and check this with AP and lateral fluoroscopy.  We then moved up to a 13 mm large 15 degree trial and this seemed to fit perfectly.  We then packed a corresponding globus titanium cage with morselized allograft and tapped this into position at L5-S1 utilizing lateral fluoroscopy.  We then placed 2 anchors into the sacrum and 1 anchor into the L5 vertebral body.  These were 30 mm anchors.  We then remove the inserter and tapped the anchors in further and then locked them into position with the locking mechanism within the graft.  We then checked AP and lateral fluoroscopy to confirm placement of our graft.  We packed around the cage with morselized allograft.  We then removed our retractor sequentially and found no significant bleeding.  We then checked AP fluoroscopy once again to confirm placement of our cage.  We also checked an AP x-ray to make sure there are no retained sponges or instruments.  Radiologist called to confirm.  Then closed the rectus fascia with a running 2-0 Prolene.  We closed the subcutaneous tissues with 2-0 Vicryl.  We closed the subcuticular tissue with 3-0 Vicryl.  We closed the skin with Dermabond.  The drapes were removed.  The patient was awakened from general anesthesia and transferred to the covering stable condition. at end the procedure all sponge needle and instrument counts  were correct.   PLAN OF CARE: Admit to inpatient   PATIENT DISPOSITION:  PACU - hemodynamically stable.   Delay start of Pharmacological VTE agent (>24hrs) due to surgical blood loss or risk of bleeding:  yes

## 2020-06-23 NOTE — Progress Notes (Signed)
Doing well, back sore, no leg pain or NTW, yet to pass gas, yet to walk.

## 2020-06-24 ENCOUNTER — Encounter (HOSPITAL_COMMUNITY): Payer: Self-pay | Admitting: Neurological Surgery

## 2020-06-24 ENCOUNTER — Encounter: Payer: BC Managed Care – PPO | Admitting: Vascular Surgery

## 2020-06-24 MED ORDER — TRAZODONE HCL 50 MG PO TABS: 50.0000 mg | ORAL_TABLET | Freq: Every evening | ORAL | 0 refills | Status: DC | PRN

## 2020-06-24 MED ORDER — METHOCARBAMOL 500 MG PO TABS: 500.0000 mg | ORAL_TABLET | Freq: Four times a day (QID) | ORAL | 1 refills | Status: DC | PRN

## 2020-06-24 MED ORDER — OXYCODONE HCL 10 MG PO TABS: 10.0000 mg | ORAL_TABLET | ORAL | 0 refills | Status: DC | PRN

## 2020-06-24 NOTE — Evaluation (Signed)
Physical Therapy Evaluation and Discharge Patient Details Name: Harry Cobb MRN: [id number] DOB: 1960/08/18 Today's Date: 06/24/2020   History of Present Illness  60 yo male s/p L5-S1 fusion. PMH including arthritis, back sx (2009), bicep tendon repair (2011), TKA left and right, , GERD.    Clinical Impression  Patient evaluated by Physical Therapy with no further acute PT needs identified. All education has been completed and the patient has no further questions. Pt was able to demonstrate transfers and ambulation with gross modified independence and no AD. Pt was educated on precautions, appropriate activity progression, and car transfer. See below for any follow-up Physical Therapy or equipment needs. PT is signing off. Thank you for this referral.     Follow Up Recommendations No PT follow up;Supervision - Intermittent    Equipment Recommendations  None recommended by PT    Recommendations for Other Services       Precautions / Restrictions Precautions Precautions: Back Precaution Booklet Issued: Yes (comment) Precaution Comments: Verbally reviewed precautions during functional mobility. Required Braces or Orthoses: Other Brace Other Brace: No brace per Dr Yetta Barre Restrictions Weight Bearing Restrictions: No      Mobility  Bed Mobility Overal bed mobility: Modified Independent Bed Mobility: Rolling;Sidelying to Sit           General bed mobility comments: HOB flat and rails lowered to simulate home environment.    Transfers Overall transfer level: Modified independent Equipment used: None             General transfer comment: No assist required and no unsteadiness noted.  Ambulation/Gait Ambulation/Gait assistance: Modified independent (Device/Increase time) Gait Distance (Feet): 400 Feet Assistive device: None Gait Pattern/deviations: Step-through pattern;Decreased stride length Gait velocity: Decreased Gait velocity interpretation: <1.31 ft/sec,  indicative of household ambulator General Gait Details: Mildly decreased gait speed and guarded steps however overall pt ambulating well without complaints of pain or any noted usnteadiness.  Stairs Stairs: Yes Stairs assistance: Modified independent (Device/Increase time) Stair Management: One rail Right;Alternating pattern;Step to pattern;Forwards Number of Stairs: 10 General stair comments: Light cues for sequencing but overall pt able to negotiate stairs without difficulty.  Wheelchair Mobility    Modified Rankin (Stroke Patients Only)       Balance Overall balance assessment: No apparent balance deficits (not formally assessed)                                           Pertinent Vitals/Pain Pain Assessment: Faces Faces Pain Scale: Hurts a little bit Pain Location: Incision site Pain Descriptors / Indicators: Discomfort;Grimacing;Operative site guarding    Home Living Family/patient expects to be discharged to:: Private residence Living Arrangements: Spouse/significant other Available Help at Discharge: Family Type of Home: House Home Access: Stairs to enter Entrance Stairs-Rails: None Entrance Stairs-Number of Steps: 2 front; 6 back (has rail) Home Layout: Two level;Bed/bath upstairs Home Equipment: Shower seat - built in      Prior Function Level of Independence: Independent               Hand Dominance   Dominant Hand: Right    Extremity/Trunk Assessment   Upper Extremity Assessment Upper Extremity Assessment: Overall WFL for tasks assessed    Lower Extremity Assessment Lower Extremity Assessment: LLE deficits/detail LLE Deficits / Details: Knee extension contracture from prior TKA. Only has around 45 of active knee flexion.    Cervical /  Trunk Assessment Cervical / Trunk Assessment: Other exceptions Cervical / Trunk Exceptions: s/p L5-S1  Communication   Communication: No difficulties  Cognition Arousal/Alertness:  Awake/alert Behavior During Therapy: WFL for tasks assessed/performed Overall Cognitive Status: Within Functional Limits for tasks assessed                                        General Comments      Exercises     Assessment/Plan    PT Assessment Patent does not need any further PT services  PT Problem List         PT Treatment Interventions      PT Goals (Current goals can be found in the Care Plan section)  Acute Rehab PT Goals Patient Stated Goal: Go home PT Goal Formulation: All assessment and education complete, DC therapy    Frequency     Barriers to discharge        Co-evaluation               AM-PAC PT "6 Clicks" Mobility  Outcome Measure Help needed turning from your back to your side while in a flat bed without using bedrails?: None Help needed moving from lying on your back to sitting on the side of a flat bed without using bedrails?: None Help needed moving to and from a bed to a chair (including a wheelchair)?: None Help needed standing up from a chair using your arms (e.g., wheelchair or bedside chair)?: None Help needed to walk in hospital room?: None Help needed climbing 3-5 steps with a railing? : None 6 Click Score: 24    End of Session Equipment Utilized During Treatment: Gait belt Activity Tolerance: Patient tolerated treatment well Patient left: in chair;with call bell/phone within reach Nurse Communication: Mobility status PT Visit Diagnosis: Unsteadiness on feet (R26.81);Pain Pain - part of body:  (back)    Time: 1779-3903 PT Time Calculation (min) (ACUTE ONLY): 12 min   Charges:   PT Evaluation $PT Eval Low Complexity: 1 Low          Conni Slipper, PT, DPT Acute Rehabilitation Services Pager: [phone] Office: [phone]   Marylynn Pearson 06/24/2020, 10:30 AM

## 2020-06-24 NOTE — Discharge Summary (Signed)
Physician Discharge Summary  Patient ID: Harry Cobb MRN: [id number] DOB/AGE: 11/25/1960 60 y.o.  Admit date: 06/23/2020 Discharge date: 06/24/2020  Admission Diagnoses: DDD/ retrolisthesis L5-S1 with radiculopathy    Discharge Diagnoses: same   Discharged Condition: good  Hospital Course: The patient was admitted on 06/23/2020 and taken to the operating room where the patient underwent ALIF L5-s1. The patient tolerated the procedure well and was taken to the recovery room and then to the floor in stable condition. The hospital course was routine. There were no complications. The wound remained clean dry and intact. Pt had appropriate back soreness. No complaints of leg pain or new N/T/W. The patient remained afebrile with stable vital signs, and tolerated a regular diet. The patient continued to increase activities, and pain was well controlled with oral pain medications.   Consults: None  Significant Diagnostic Studies:  Results for orders placed or performed during the hospital encounter of 06/19/20  SARS CORONAVIRUS 2 (TAT 6-24 HRS) Nasopharyngeal Nasopharyngeal Swab   Specimen: Nasopharyngeal Swab  Result Value Ref Range   SARS Coronavirus 2 NEGATIVE NEGATIVE    Chest 2 View  Result Date: 06/20/2020 CLINICAL DATA:  Preoperative chest x-ray EXAM: CHEST - 2 VIEW COMPARISON:  December 11, 2018 FINDINGS: The heart size and mediastinal contours are within normal limits. Both lungs are clear. The visualized skeletal structures are stable. IMPRESSION: No active cardiopulmonary disease. Electronically Signed   By: Sherian Rein M.D.   On: 06/20/2020 08:29   DG Lumbar Spine 2-3 Views  Result Date: 06/23/2020 CLINICAL DATA:  Elective surgery. Additional history provided: Anterior lumbar interbody fusion lumbar 5-sacral 1. Provided FLUOROSCOPY TIME:  40.7 seconds (31.17 mGy). EXAM: LUMBAR SPINE - 2-3 VIEW; DG C-ARM 1-60 MIN COMPARISON:  Lumbar spine MRI 04/09/2020. FINDINGS: PA and lateral view  intraoperative fluoroscopic images of the lumbosacral spine are submitted, 2 images total. The images demonstrate interbody fusion hardware at L5-S1 from an anterior approach. Overlying retractors. IMPRESSION: Two intraoperative fluoroscopic images from L5-S1 interbody fusion, as described. Electronically Signed   By: Jackey Loge DO   On: 06/23/2020 11:09   DG C-Arm 1-60 Min  Result Date: 06/23/2020 CLINICAL DATA:  Elective surgery. Additional history provided: Anterior lumbar interbody fusion lumbar 5-sacral 1. Provided FLUOROSCOPY TIME:  40.7 seconds (31.17 mGy). EXAM: LUMBAR SPINE - 2-3 VIEW; DG C-ARM 1-60 MIN COMPARISON:  Lumbar spine MRI 04/09/2020. FINDINGS: PA and lateral view intraoperative fluoroscopic images of the lumbosacral spine are submitted, 2 images total. The images demonstrate interbody fusion hardware at L5-S1 from an anterior approach. Overlying retractors. IMPRESSION: Two intraoperative fluoroscopic images from L5-S1 interbody fusion, as described. Electronically Signed   By: Jackey Loge DO   On: 06/23/2020 11:09   DG OR LOCAL ABDOMEN  Result Date: 06/23/2020 CLINICAL DATA:  L5-S1 ALIF, assessment for retained instruments, needles, or sponges EXAM: OR LOCAL ABDOMEN COMPARISON:  Portable exam 1036 hours without priors for comparison FINDINGS: Orthopedic hardware L5-S1 and at LEFT hip. Calcified pelvic phleboliths. Electronic device and wire project over the lateral LEFT pelvis and hip region. No unexpected retained radiopaque foreign bodies. IMPRESSION: No unexpected retained radiopaque foreign bodies identified. Electronically Signed   By: Ulyses Southward M.D.   On: 06/23/2020 10:55    Antibiotics:  Anti-infectives (From admission, onward)   Start     Dose/Rate Route Frequency Ordered Stop   06/23/20 1700  ceFAZolin (ANCEF) IVPB 2g/100 mL premix        2 g 200 mL/hr over 30 Minutes  Intravenous Every 8 hours 06/23/20 1250 06/24/20 0017   06/23/20 0615  ceFAZolin (ANCEF) IVPB 2g/100  mL premix        2 g 200 mL/hr over 30 Minutes Intravenous On call to O.R. 06/23/20 [id number] 06/23/20 0924      Discharge Exam: Blood pressure 101/66, pulse (!) 50, temperature 98.2 F (36.8 C), temperature source Oral, resp. rate 20, height 6' (1.829 m), weight 96.1 kg, SpO2 95 %. Neurologic: Alert and oriented X 3, normal strength and tone. Normal symmetric reflexes. Normal coordination and gait CDI  Discharge Medications:   Allergies as of 06/24/2020   No Known Allergies     Medication List    STOP taking these medications   HYDROcodone-acetaminophen 5-325 MG tablet Commonly known as: NORCO/VICODIN     TAKE these medications   amoxicillin 500 MG capsule Commonly known as: AMOXIL Take 2,000 mg by mouth See admin instructions. Before dental procedures   fexofenadine 180 MG tablet Commonly known as: ALLEGRA Take 180 mg by mouth daily as needed for allergies or rhinitis.   methocarbamol 500 MG tablet Commonly known as: ROBAXIN Take 1 tablet (500 mg total) by mouth every 6 (six) hours as needed for muscle spasms.   naproxen sodium 220 MG tablet Commonly known as: ALEVE Take 440 mg by mouth 2 (two) times daily with a meal.   Oxycodone HCl 10 MG Tabs Take 1 tablet (10 mg total) by mouth every 4 (four) hours as needed for severe pain ((score 7 to 10)).   pantoprazole 40 MG tablet Commonly known as: PROTONIX Take by mouth daily as needed (acid reflux).   traZODone 50 MG tablet Commonly known as: DESYREL Take 1 tablet (50 mg total) by mouth at bedtime as needed for sleep.            Durable Medical Equipment  (From admission, onward)         Start     Ordered   06/23/20 1251  DME Walker rolling  Once       Question:  Patient needs a walker to treat with the following condition  Answer:  S/P lumbar fusion   06/23/20 1250   06/23/20 1251  DME 3 n 1  Once        06/23/20 1250          Disposition: home   Final Dx: ALIF L5-S1  Discharge Instructions      Remove dressing in 72 hours   Complete by: As directed    Call MD for:  persistant nausea and vomiting   Complete by: As directed    Call MD for:  redness, tenderness, or signs of infection (pain, swelling, redness, odor or green/yellow discharge around incision site)   Complete by: As directed    Call MD for:  severe uncontrolled pain   Complete by: As directed    Call MD for:  temperature >100.4   Complete by: As directed    Diet - low sodium heart healthy   Complete by: As directed    Increase activity slowly   Complete by: As directed          Signed: Eustace Moore 06/24/2020, 7:55 AM

## 2020-06-24 NOTE — Progress Notes (Signed)
[facility]  Subjective  -no complaints.  No nausea.   Objective 101/66 (!) 50 98.2 F (36.8 C) (Oral) 20 95%  Intake/Output Summary (Last 24 hours) at 06/24/2020 0657 Last data filed at 06/24/2020 0400 Gross per 24 hour  Intake 1414.28 ml  Output 560 ml  Net 854.28 ml    Transverse incision over the left rectus muscle clean dry and intact. Appropriate postop incisional tenderness with no other guarding or rebound. Left DP palpable.  Laboratory Lab Results: No results for input(s): WBC, HGB, HCT, PLT in the last 72 hours. BMET No results for input(s): NA, K, CL, CO2, GLUCOSE, BUN, CREATININE, CALCIUM in the last 72 hours.  COAG Lab Results  Component Value Date   INR 1.1 06/19/2020   INR 1.05 07/28/2016   INR 1.05 08/05/2015   No results found for: PTT  Assessment/Planning:  Postop day 1 status post L5-S1 ALIF.  Overall looks good this morning with appropriate postop incisional tenderness.  Tolerating p.o.  Stable for discharge from my standpoint.  Cephus Shelling 06/24/2020 6:57 AM --

## 2020-06-24 NOTE — Plan of Care (Signed)
Patient alert and oriented, mae's well, voiding adequate amount of urine, swallowing without difficulty, no c/o pain at time of discharge. Patient discharged home with family. Script and discharged instructions given to patient. Patient and family stated understanding of instructions given. Patient has an appointment with Dr. Jones °

## 2020-06-25 MED FILL — Heparin Sodium (Porcine) Inj 1000 Unit/ML: INTRAMUSCULAR | Qty: 30 | Status: AC

## 2020-06-25 MED FILL — Sodium Chloride IV Soln 0.9%: INTRAVENOUS | Qty: 1000 | Status: AC

## 2020-07-08 DIAGNOSIS — M961 Postlaminectomy syndrome, not elsewhere classified: Secondary | ICD-10-CM | POA: Diagnosis not present

## 2020-08-11 DIAGNOSIS — J3089 Other allergic rhinitis: Secondary | ICD-10-CM | POA: Diagnosis not present

## 2020-08-11 DIAGNOSIS — M25611 Stiffness of right shoulder, not elsewhere classified: Secondary | ICD-10-CM | POA: Diagnosis not present

## 2020-08-11 DIAGNOSIS — J301 Allergic rhinitis due to pollen: Secondary | ICD-10-CM | POA: Diagnosis not present

## 2020-08-11 DIAGNOSIS — L299 Pruritus, unspecified: Secondary | ICD-10-CM | POA: Diagnosis not present

## 2020-08-11 DIAGNOSIS — M25662 Stiffness of left knee, not elsewhere classified: Secondary | ICD-10-CM | POA: Diagnosis not present

## 2020-08-11 DIAGNOSIS — B999 Unspecified infectious disease: Secondary | ICD-10-CM | POA: Diagnosis not present

## 2020-08-13 DIAGNOSIS — M25661 Stiffness of right knee, not elsewhere classified: Secondary | ICD-10-CM | POA: Diagnosis not present

## 2020-08-13 DIAGNOSIS — M25662 Stiffness of left knee, not elsewhere classified: Secondary | ICD-10-CM | POA: Diagnosis not present

## 2020-08-15 DIAGNOSIS — E7800 Pure hypercholesterolemia, unspecified: Secondary | ICD-10-CM | POA: Diagnosis not present

## 2020-08-15 DIAGNOSIS — Z125 Encounter for screening for malignant neoplasm of prostate: Secondary | ICD-10-CM | POA: Diagnosis not present

## 2020-08-18 DIAGNOSIS — M25662 Stiffness of left knee, not elsewhere classified: Secondary | ICD-10-CM | POA: Diagnosis not present

## 2020-08-18 DIAGNOSIS — M25661 Stiffness of right knee, not elsewhere classified: Secondary | ICD-10-CM | POA: Diagnosis not present

## 2020-08-21 DIAGNOSIS — M25661 Stiffness of right knee, not elsewhere classified: Secondary | ICD-10-CM | POA: Diagnosis not present

## 2020-08-21 DIAGNOSIS — M25662 Stiffness of left knee, not elsewhere classified: Secondary | ICD-10-CM | POA: Diagnosis not present

## 2020-08-22 DIAGNOSIS — R82998 Other abnormal findings in urine: Secondary | ICD-10-CM | POA: Diagnosis not present

## 2020-09-01 DIAGNOSIS — M25661 Stiffness of right knee, not elsewhere classified: Secondary | ICD-10-CM | POA: Diagnosis not present

## 2020-09-01 DIAGNOSIS — M25662 Stiffness of left knee, not elsewhere classified: Secondary | ICD-10-CM | POA: Diagnosis not present

## 2020-09-04 DIAGNOSIS — M25662 Stiffness of left knee, not elsewhere classified: Secondary | ICD-10-CM | POA: Diagnosis not present

## 2020-09-04 DIAGNOSIS — M25661 Stiffness of right knee, not elsewhere classified: Secondary | ICD-10-CM | POA: Diagnosis not present

## 2020-09-09 DIAGNOSIS — M25661 Stiffness of right knee, not elsewhere classified: Secondary | ICD-10-CM | POA: Diagnosis not present

## 2020-09-09 DIAGNOSIS — M25662 Stiffness of left knee, not elsewhere classified: Secondary | ICD-10-CM | POA: Diagnosis not present

## 2020-09-10 DIAGNOSIS — K921 Melena: Secondary | ICD-10-CM | POA: Diagnosis not present

## 2020-09-11 DIAGNOSIS — Z741 Need for assistance with personal care: Secondary | ICD-10-CM | POA: Diagnosis not present

## 2020-09-11 DIAGNOSIS — Z1339 Encounter for screening examination for other mental health and behavioral disorders: Secondary | ICD-10-CM | POA: Diagnosis not present

## 2020-09-11 DIAGNOSIS — Z0000 Encounter for general adult medical examination without abnormal findings: Secondary | ICD-10-CM | POA: Diagnosis not present

## 2020-09-11 DIAGNOSIS — M5136 Other intervertebral disc degeneration, lumbar region: Secondary | ICD-10-CM | POA: Diagnosis not present

## 2020-09-11 DIAGNOSIS — R82998 Other abnormal findings in urine: Secondary | ICD-10-CM | POA: Diagnosis not present

## 2020-09-11 DIAGNOSIS — Z1331 Encounter for screening for depression: Secondary | ICD-10-CM | POA: Diagnosis not present

## 2020-09-11 DIAGNOSIS — E7800 Pure hypercholesterolemia, unspecified: Secondary | ICD-10-CM | POA: Diagnosis not present

## 2020-09-12 DIAGNOSIS — M25662 Stiffness of left knee, not elsewhere classified: Secondary | ICD-10-CM | POA: Diagnosis not present

## 2020-09-25 ENCOUNTER — Ambulatory Visit (INDEPENDENT_AMBULATORY_CARE_PROVIDER_SITE_OTHER): Payer: BC Managed Care – PPO

## 2020-09-25 ENCOUNTER — Ambulatory Visit (INDEPENDENT_AMBULATORY_CARE_PROVIDER_SITE_OTHER): Payer: BC Managed Care – PPO | Admitting: Podiatry

## 2020-09-25 ENCOUNTER — Other Ambulatory Visit: Payer: Self-pay

## 2020-09-25 ENCOUNTER — Encounter: Payer: Self-pay | Admitting: Podiatry

## 2020-09-25 DIAGNOSIS — M76821 Posterior tibial tendinitis, right leg: Secondary | ICD-10-CM

## 2020-09-25 DIAGNOSIS — T8484XA Pain due to internal orthopedic prosthetic devices, implants and grafts, initial encounter: Secondary | ICD-10-CM | POA: Diagnosis not present

## 2020-09-25 DIAGNOSIS — M76822 Posterior tibial tendinitis, left leg: Secondary | ICD-10-CM

## 2020-09-25 DIAGNOSIS — M722 Plantar fascial fibromatosis: Secondary | ICD-10-CM

## 2020-09-25 DIAGNOSIS — M24662 Ankylosis, left knee: Secondary | ICD-10-CM | POA: Diagnosis not present

## 2020-09-25 DIAGNOSIS — Z96653 Presence of artificial knee joint, bilateral: Secondary | ICD-10-CM | POA: Diagnosis not present

## 2020-09-25 MED ORDER — TRIAMCINOLONE ACETONIDE 10 MG/ML IJ SUSP
10.0000 mg | Freq: Once | INTRAMUSCULAR | Status: AC
  Administered 2020-09-25: 20 mg

## 2020-09-25 MED ORDER — PREDNISONE 10 MG PO TABS: ORAL_TABLET | ORAL | 0 refills | Status: DC

## 2020-09-26 NOTE — Progress Notes (Signed)
Subjective:   Patient ID: Harry Cobb, male   DOB: 60 y.o.   MRN: [id number]   HPI Patient presents stating he has developed pain on the inside of both feet and he does try to stay quite active but did have back surgery in January   ROS      Objective:  Physical Exam  Neurovascular status intact muscle strength adequate range of motion adequate with patient found to have exquisite discomfort in the posterior tibial insertion into the navicular bilateral with inflammation around this area noted.  It is localized to this area is quite sore when pressed     Assessment:  Acute posterior tibial tendinitis bilateral     Plan:  Reviewed conditions foot structure consideration long-term orthotics and at this time did sterile prep and injected the tendon insertion 3 mg dexamethasone Kenalog 5 mg Xylocaine advised on ice therapy reappoint to recheck  X-rays indicate moderate depression of the arch no indications of other pathology

## 2020-10-09 ENCOUNTER — Ambulatory Visit (INDEPENDENT_AMBULATORY_CARE_PROVIDER_SITE_OTHER): Payer: BC Managed Care – PPO | Admitting: Podiatry

## 2020-10-09 ENCOUNTER — Encounter: Payer: Self-pay | Admitting: Podiatry

## 2020-10-09 ENCOUNTER — Other Ambulatory Visit: Payer: Self-pay

## 2020-10-09 DIAGNOSIS — M76822 Posterior tibial tendinitis, left leg: Secondary | ICD-10-CM

## 2020-10-09 DIAGNOSIS — M76821 Posterior tibial tendinitis, right leg: Secondary | ICD-10-CM

## 2020-10-09 DIAGNOSIS — L600 Ingrowing nail: Secondary | ICD-10-CM | POA: Diagnosis not present

## 2020-10-10 NOTE — Progress Notes (Signed)
Subjective:   Patient ID: Harry Cobb, male   DOB: 60 y.o.   MRN: [id number]   HPI Patient states he is feeling quite a bit better from having had inflammation bilateral with patient also having mild nail disease and having had history of back surgery several months ago   ROS      Objective:  Physical Exam  Neurovascular status intact with significant diminishment of discomfort in the posterior tibial tendon bilateral with minimal discomfort when palpated no loss of motion no indication of tendon dysfunction with nail disease and generalized orthopedic challenges     Assessment:  Posterior tibial tendinitis bilateral improving along with nail disease and moderate flatfoot deformity     Plan:  H&P discussed condition at great length.  I recommended shoe gear modifications continued orthotic usage not going barefoot not wearing flat shoes ice therapy and topical anti-inflammatories.  Patient is encouraged to call with questions may require further treatments if symptoms persist and if they get worse may require MRI I do not recommend treatment for nails which I discussed today

## 2021-01-05 DIAGNOSIS — F438 Other reactions to severe stress: Secondary | ICD-10-CM | POA: Diagnosis not present

## 2021-01-05 DIAGNOSIS — F419 Anxiety disorder, unspecified: Secondary | ICD-10-CM | POA: Diagnosis not present

## 2021-01-12 DIAGNOSIS — F438 Other reactions to severe stress: Secondary | ICD-10-CM | POA: Diagnosis not present

## 2021-01-12 DIAGNOSIS — F419 Anxiety disorder, unspecified: Secondary | ICD-10-CM | POA: Diagnosis not present

## 2021-01-26 DIAGNOSIS — F438 Other reactions to severe stress: Secondary | ICD-10-CM | POA: Diagnosis not present

## 2021-01-26 DIAGNOSIS — F419 Anxiety disorder, unspecified: Secondary | ICD-10-CM | POA: Diagnosis not present

## 2021-02-03 DIAGNOSIS — F438 Other reactions to severe stress: Secondary | ICD-10-CM | POA: Diagnosis not present

## 2021-02-03 DIAGNOSIS — F419 Anxiety disorder, unspecified: Secondary | ICD-10-CM | POA: Diagnosis not present

## 2021-02-05 DIAGNOSIS — S76012A Strain of muscle, fascia and tendon of left hip, initial encounter: Secondary | ICD-10-CM | POA: Diagnosis not present

## 2021-03-30 DIAGNOSIS — E7800 Pure hypercholesterolemia, unspecified: Secondary | ICD-10-CM | POA: Diagnosis not present

## 2021-04-02 DIAGNOSIS — F4389 Other reactions to severe stress: Secondary | ICD-10-CM | POA: Diagnosis not present

## 2021-04-02 DIAGNOSIS — F419 Anxiety disorder, unspecified: Secondary | ICD-10-CM | POA: Diagnosis not present

## 2021-04-08 DIAGNOSIS — M25511 Pain in right shoulder: Secondary | ICD-10-CM | POA: Diagnosis not present

## 2021-04-08 DIAGNOSIS — M7541 Impingement syndrome of right shoulder: Secondary | ICD-10-CM | POA: Diagnosis not present

## 2021-04-14 NOTE — Progress Notes (Signed)
DUE TO COVID-19 ONLY ONE VISITOR IS ALLOWED TO COME WITH YOU AND STAY IN THE WAITING ROOM ONLY DURING PRE OP AND PROCEDURE DAY OF SURGERY.  2 VISITOR  MAY VISIT WITH YOU AFTER SURGERY IN YOUR PRIVATE ROOM DURING VISITING HOURS ONLY!  YOU NEED TO HAVE A COVID 19 TEST ON___11/09/2020 @_  @_from  8am-3pm _____, THIS TEST MUST BE DONE BEFORE SURGERY,  Covid test is done at [place], [place] [address].  This is a drive thru.  No appt required. Please see map.                 Your procedure is scheduled on:  04/28/2021   Report to [facility] Main  Entrance   Report to admitting at 1100   AM     Call this number if you have problems the morning of surgery [phone]    REMEMBER: NO  SOLID FOOD CANDY OR GUM AFTER MIDNIGHT. CLEAR LIQUIDS UNTIL    1050AM        . NOTHING BY MOUTH EXCEPT CLEAR LIQUIDS UNTIL  1050AM   . PLEASE FINISH ENSURE DRINK PER SURGEON ORDER  WHICH NEEDS TO BE COMPLETED AT     1050AM  .      CLEAR LIQUID DIET   Foods Allowed                                                                    Coffee and tea, regular and decaf                            Fruit ices (not with fruit pulp)                                      Iced Popsicles                                    Carbonated beverages, regular and diet                                    Cranberry, grape and apple juices Sports drinks like Gatorade Lightly seasoned clear broth or consume(fat free) Sugar, honey syrup ___________________________________________________________________      BRUSH YOUR TEETH MORNING OF SURGERY AND RINSE YOUR MOUTH OUT, NO CHEWING GUM CANDY OR MINTS.     Take these medicines the morning of surgery with A SIP OF WATER:  PROTONIX   DO NOT TAKE ANY DIABETIC MEDICATIONS DAY OF YOUR SURGERY                               You may not have any metal on your body including hair pins and              piercings  Do not wear jewelry, make-up, lotions, powders or  perfumes, deodorant             Do not  wear nail polish on your fingernails.  Do not shave  48 hours prior to surgery.              Men may shave face and neck.   Do not bring valuables to the hospital. [place].  Contacts, dentures or bridgework may not be worn into surgery.  Leave suitcase in the car. After surgery it may be brought to your room.     Patients discharged the day of surgery will not be allowed to drive home. IF YOU ARE HAVING SURGERY AND GOING HOME THE SAME DAY, YOU MUST HAVE AN ADULT TO DRIVE YOU HOME AND BE WITH YOU FOR 24 HOURS. YOU MAY GO HOME BY TAXI OR UBER OR ORTHERWISE, BUT AN ADULT MUST ACCOMPANY YOU HOME AND STAY WITH YOU FOR 24 HOURS.  Name and phone number of your driver:  Special Instructions: N/A              Please read over the following fact sheets you were given: _____________________________________________________________________  [facility] - Preparing for Surgery Before surgery, you can play an important role.  Because skin is not sterile, your skin needs to be as free of germs as possible.  You can reduce the number of germs on your skin by washing with CHG (chlorahexidine gluconate) soap before surgery.  CHG is an antiseptic cleaner which kills germs and bonds with the skin to continue killing germs even after washing. Please DO NOT use if you have an allergy to CHG or antibacterial soaps.  If your skin becomes reddened/irritated stop using the CHG and inform your nurse when you arrive at Short Stay. Do not shave (including legs and underarms) for at least 48 hours prior to the first CHG shower.  You may shave your face/neck. Please follow these instructions carefully:  1.  Shower with CHG Soap the night before surgery and the  morning of Surgery.  2.  If you choose to wash your hair, wash your hair first as usual with your  normal  shampoo.  3.  After you shampoo, rinse your hair and body thoroughly  to remove the  shampoo.                           4.  Use CHG as you would any other liquid soap.  You can apply chg directly  to the skin and wash                       Gently with a scrungie or clean washcloth.  5.  Apply the CHG Soap to your body ONLY FROM THE NECK DOWN.   Do not use on face/ open                           Wound or open sores. Avoid contact with eyes, ears mouth and genitals (private parts).                       Wash face,  Genitals (private parts) with your normal soap.             6.  Wash thoroughly, paying special attention to the area where your surgery  will be performed.  7.  Thoroughly rinse your body with warm water  from the neck down.  8.  DO NOT shower/wash with your normal soap after using and rinsing off  the CHG Soap.                9.  Pat yourself dry with a clean towel.            10.  Wear clean pajamas.            11.  Place clean sheets on your bed the night of your first shower and do not  sleep with pets. Day of Surgery : Do not apply any lotions/deodorants the morning of surgery.  Please wear clean clothes to the hospital/surgery center.  FAILURE TO FOLLOW THESE INSTRUCTIONS MAY RESULT IN THE CANCELLATION OF YOUR SURGERY PATIENT SIGNATURE_________________________________  NURSE SIGNATURE__________________________________  ________________________________________________________________________

## 2021-04-14 NOTE — Progress Notes (Addendum)
Anesthesia Review:  PCP: DR Tisovec Clearance dated 03/30/21 on chart  Cardiologist : Chest x-ray : 2V- 06/20/20  EKG : 06/19/2020  Echo : Stress test: Cardiac Cath :  Activity level: can do a flgiht of stairs without difficulty  Sleep Study/ CPAP : none  Fasting Blood Sugar :      / Checks Blood Sugar -- times a day:   Blood Thinner/ Instructions /Last Dose: ASA / Instructions/ Last Dose :   Covid test - 04/24/2021

## 2021-04-16 ENCOUNTER — Encounter (HOSPITAL_COMMUNITY)
Admission: RE | Admit: 2021-04-16 | Discharge: 2021-04-16 | Disposition: A | Discharge status: Home / Self Care | Payer: BC Managed Care – PPO | Source: Ambulatory Visit | Attending: Orthopedic Surgery | Admitting: Orthopedic Surgery

## 2021-04-16 ENCOUNTER — Encounter (HOSPITAL_COMMUNITY): Payer: Self-pay

## 2021-04-16 ENCOUNTER — Other Ambulatory Visit: Payer: Self-pay

## 2021-04-16 VITALS — BP 122/88 | HR 51 | Temp 98.0°F | Resp 16 | Ht 71.5 in | Wt 210.0 lb

## 2021-04-16 DIAGNOSIS — Z01812 Encounter for preprocedural laboratory examination: Secondary | ICD-10-CM | POA: Insufficient documentation

## 2021-04-16 DIAGNOSIS — Z01818 Encounter for other preprocedural examination: Secondary | ICD-10-CM

## 2021-04-16 LAB — TYPE AND SCREEN
ABO/RH(D): O NEG
Antibody Screen: NEGATIVE

## 2021-04-16 LAB — COMPREHENSIVE METABOLIC PANEL
ALT: 21 U/L (ref 0–44)
AST: 23 U/L (ref 15–41)
Albumin: 4.1 g/dL (ref 3.5–5.0)
Alkaline Phosphatase: 60 U/L (ref 38–126)
Anion gap: 5 (ref 5–15)
BUN: 23 mg/dL — ABNORMAL HIGH (ref 6–20)
CO2: 23 mmol/L (ref 22–32)
Calcium: 8.8 mg/dL — ABNORMAL LOW (ref 8.9–10.3)
Chloride: 108 mmol/L (ref 98–111)
Creatinine, Ser: 1.04 mg/dL (ref 0.61–1.24)
GFR, Estimated: >60 mL/min (ref >60)
Glucose, Bld: 90 mg/dL (ref 70–99)
Potassium: 3.8 mmol/L (ref 3.5–5.1)
Sodium: 136 mmol/L (ref 135–145)
Total Bilirubin: 2.1 mg/dL — ABNORMAL HIGH (ref 0.3–1.2)
Total Protein: 7.2 g/dL (ref 6.5–8.1)

## 2021-04-16 LAB — CBC
HCT: 44.6 % (ref 39.0–52.0)
Hemoglobin: 15.2 g/dL (ref 13.0–17.0)
MCH: 31.3 pg (ref 26.0–34.0)
MCHC: 34.1 g/dL (ref 30.0–36.0)
MCV: 91.8 fL (ref 80.0–100.0)
Platelets: 150 10*3/uL (ref 150–400)
RBC: 4.86 MIL/uL (ref 4.22–5.81)
RDW: 12.8 % (ref 11.5–15.5)
WBC: 5.1 10*3/uL (ref 4.0–10.5)
nRBC: 0 % (ref 0.0–0.2)

## 2021-04-16 LAB — SURGICAL PCR SCREEN
MRSA, PCR: NEGATIVE
Staphylococcus aureus: POSITIVE — AB

## 2021-04-16 NOTE — Progress Notes (Signed)
Anesthesia Chart Review   Case: [id number] Date/Time: 04/28/21 1240   Procedure: TOTAL KNEE REVISION (Left: Knee)   Anesthesia type: Spinal   Pre-op diagnosis: Failed left total knee arthroplasty   Location: [facility] ROOM 09 / [facility]   Surgeons: Paralee Cancel, MD       DISCUSSION:60 y.o. never smoker with h/o GERD, failed left total knee arthroplasty scheduled for above procedure 04/28/2021 with Dr. Paralee Cancel.   S/p L5-S1 fusion with hardware in place 06/23/2020 with no anesthesia complications noted.   Clearance from PCP on chart which states pt is low risk for planned procedure.   Anticipate pt can proceed with planned procedure barring acute status change.   VS: BP 122/88   Pulse (!) 51   Temp 36.7 C (Oral)   Resp 16   Ht 5' 11.5" (1.816 m)   Wt 95.3 kg   SpO2 96%   BMI 28.88 kg/m   PROVIDERS: Tisovec, Fransico Him, MD is PCP    LABS: Labs reviewed: Acceptable for surgery. (all labs ordered are listed, but only abnormal results are displayed)  Labs Reviewed  SURGICAL PCR SCREEN - Abnormal; Notable for the following components:      Result Value   Staphylococcus aureus POSITIVE (*)    All other components within normal limits  COMPREHENSIVE METABOLIC PANEL - Abnormal; Notable for the following components:   BUN 23 (*)    Calcium 8.8 (*)    Total Bilirubin 2.1 (*)    All other components within normal limits  CBC  TYPE AND SCREEN     IMAGES:   EKG: 06/19/2020 Rate 51 bpm  Sinus bradycardia Otherwise normal ECG No significant change since last tracing  CV:  Past Medical History:  Diagnosis Date   Arthritis    Back pain    radiates into the left posterior thigh to the calf.   GERD (gastroesophageal reflux disease)    History of kidney stones    Left shoulder pain    Leg pain    Worse than back pain   Lumbar foraminal stenosis    No pertinent past medical history    Radiculopathy, lumbar region     Past Surgical History:  Procedure Laterality Date    ABDOMINAL EXPOSURE N/A 06/23/2020   Procedure: ABDOMINAL EXPOSURE;  Surgeon: Marty Heck, MD;  Location: [place];  Service: Vascular;  Laterality: N/A;   ANTERIOR LUMBAR FUSION N/A 06/23/2020   Procedure: Anterior Lumbar Interbody Fusion - Lumbar five-Sacral one;  Surgeon: Eustace Moore, MD;  Location: [place];  Service: Neurosurgery;  Laterality: N/A;   BACK SURGERY  2009   BICEPS TENDON REPAIR  2011   EYE SURGERY     Lasik   JOINT REPLACEMENT     both partial knee replacements   KNEE ARTHROSCOPY  11/12   lt   KNEE SURGERY  2009/2011   Rt and Lt knee replacment   LASER SPINE SURGERY     SCAR REVISION Left 08/03/2016   Procedure: Left hip open scar debridement ;  Surgeon: Paralee Cancel, MD;  Location: [facility];  Service: Orthopedics;  Laterality: Left;   shoulder cyst  06/04/11   right cyst removed   TONSILLECTOMY     TOTAL HIP ARTHROPLASTY Left 08/12/2015   Procedure: LEFT TOTAL HIP ARTHROPLASTY ANTERIOR APPROACH;  Surgeon: Paralee Cancel, MD;  Location: [facility];  Service: Orthopedics;  Laterality: Left;   TOTAL KNEE ARTHROPLASTY Left 11/20/2012   Procedure: CONVERT LEFT UNI KNEE ARTHROPLASTY TO  LEFT TOTAL KNEE ARTHROPLASTY;  Surgeon: Mauri Pole, MD;  Location: [facility];  Service: Orthopedics;  Laterality: Left;   TOTAL KNEE REVISION WITH SCAR DEBRIDEMENT/PATELLA REVISION WITH POLY EXCHANGE Left 08/03/2016   Procedure: Left knee open scar debridement, poly exchange ;  Surgeon: Paralee Cancel, MD;  Location: [facility];  Service: Orthopedics;  Laterality: Left;   UMBILICAL HERNIA REPAIR  06/04/2011   Procedure: HERNIA REPAIR UMBILICAL ADULT;  Surgeon: Rolm Bookbinder, MD;  Location: [facility];  Service: General;  Laterality: N/A;  umbilical hernia repiar and mesh and removal of lympoma on right shoulder    MEDICATIONS:  diclofenac (VOLTAREN) 75 MG EC tablet   fexofenadine (ALLEGRA) 180 MG tablet   No current facility-administered medications for this encounter.      Konrad Felix Ward, PA-C [facility] Pre-Surgical Testing [phone]

## 2021-04-20 ENCOUNTER — Other Ambulatory Visit: Payer: Self-pay

## 2021-04-20 ENCOUNTER — Ambulatory Visit (INDEPENDENT_AMBULATORY_CARE_PROVIDER_SITE_OTHER): Payer: BC Managed Care – PPO | Admitting: Podiatry

## 2021-04-20 ENCOUNTER — Encounter: Payer: Self-pay | Admitting: Podiatry

## 2021-04-20 DIAGNOSIS — Q828 Other specified congenital malformations of skin: Secondary | ICD-10-CM | POA: Diagnosis not present

## 2021-04-20 DIAGNOSIS — M76822 Posterior tibial tendinitis, left leg: Secondary | ICD-10-CM

## 2021-04-20 DIAGNOSIS — M76821 Posterior tibial tendinitis, right leg: Secondary | ICD-10-CM

## 2021-04-20 MED ORDER — TRIAMCINOLONE ACETONIDE 10 MG/ML IJ SUSP
10.0000 mg | Freq: Once | INTRAMUSCULAR | Status: AC
  Administered 2021-04-20: 10 mg

## 2021-04-20 NOTE — Progress Notes (Signed)
Subjective:   Patient ID: [name], male   DOB: 60 y.o.   MRN: [id number]   HPI Patient presents with inflammation around the posterior tibial insertion left with some enlargement of the joint with patient being very active plan a lot of golf and other activities with the plantar fashion doing excellent and discomfort in the right fourth toe and the medial side of the right first metatarsal   ROS      Objective:  Physical Exam  Neurovascular status found to be intact inflammation around the posterior tibial insertion into the left navicular with inflammation around this area and also noted to have a keratotic lesion right first metatarsal right fourth digit     Assessment:  Posterior tibial tendinitis left at insertion with porokeratotic lesion formation medial side right and fourth digit     Plan:  H&P reviewed both conditions and for the posterior tib I did discuss injection explaining chances for rupture and that I want him to wear good support afterwards.  Careful sterile prep done and injected near the insertion keeping it and that she 3 mg Dexasone Kenalog 5 mg Xylocaine and advised on reduced activity.  For the other ones I debrided the lesions no iatrogenic bleeding reappoint as needed may require other treatments

## 2021-04-21 DIAGNOSIS — F419 Anxiety disorder, unspecified: Secondary | ICD-10-CM | POA: Diagnosis not present

## 2021-04-21 DIAGNOSIS — K921 Melena: Secondary | ICD-10-CM | POA: Diagnosis not present

## 2021-04-21 DIAGNOSIS — F4389 Other reactions to severe stress: Secondary | ICD-10-CM | POA: Diagnosis not present

## 2021-04-23 NOTE — H&P (Signed)
TOTAL KNEE REVISION ADMISSION H&P  Patient is being admitted for left revision total knee arthroplasty.  Subjective:  Chief Complaint: left knee stiffness and pain, arthrofibrosis  HPI: Harry Cobb, 60 y.o. male, has a history of pain and functional disability in the left knee(s) due to  arthrofibrosis  and patient has failed non-surgical conservative treatments for greater than 12 weeks to include NSAID's and/or analgesics, flexibility and strengthening excercises, supervised PT with diminished ADL's post treatment, and activity modification. The indications for the revision of the total knee arthroplasty are  arthrofibrosis with limited flexion . He had conversion of left unicompartmental knee arthroplasty to total knee arthroplasty in 2014 by Dr. Alvan Dame. He subsequently developed arthrofibrosis despite extensive physical therapy. He underwent open scar excision in 2018 by Dr. Alvan Dame with recurrent arthrofibrosis. Dr. Alvan Dame discussed with him the limitations of total knee revision in managing arthrofibrosis and flexion. They reviewed risks, benefits, and expectations of surgery and he does wish to proceed.There is no current active infection.  Patient Active Problem List   Diagnosis Date Noted   S/P lumbar fusion 06/23/2020   DDD (degenerative disc disease), cervical 12/13/2019   Lumbar post-laminectomy syndrome 12/13/2019   Degeneration of lumbar intervertebral disc 12/13/2019   Impingement syndrome of left shoulder region 06/23/2018   Pain in joint of left shoulder 04/26/2018   Low back pain 08/24/2017   Knee stiffness, left 08/03/2016   History of total knee replacement, left 08/12/2015   Expected blood loss anemia, postoperative 11/21/2012   Overweight (BMI 25.0-29.9) 11/21/2012   S/P left UKA to TKA 99/83/3825   Umbilical hernia 05/39/7673   Past Medical History:  Diagnosis Date   Arthritis    Back pain    radiates into the left posterior thigh to the calf.   GERD (gastroesophageal  reflux disease)    History of kidney stones    Left shoulder pain    Leg pain    Worse than back pain   Lumbar foraminal stenosis    No pertinent past medical history    Radiculopathy, lumbar region     Past Surgical History:  Procedure Laterality Date   ABDOMINAL EXPOSURE N/A 06/23/2020   Procedure: ABDOMINAL EXPOSURE;  Surgeon: Marty Heck, MD;  Location: [facility];  Service: Vascular;  Laterality: N/A;   ANTERIOR LUMBAR FUSION N/A 06/23/2020   Procedure: Anterior Lumbar Interbody Fusion - Lumbar five-Sacral one;  Surgeon: Eustace Moore, MD;  Location: [place] [facility];  Service: Neurosurgery;  Laterality: N/A;   BACK SURGERY  2009   BICEPS TENDON REPAIR  2011   EYE SURGERY     Lasik   JOINT REPLACEMENT     both partial knee replacements   KNEE ARTHROSCOPY  11/12   lt   KNEE SURGERY  2009/2011   Rt and Lt knee replacment   LASER SPINE SURGERY     SCAR REVISION Left 08/03/2016   Procedure: Left hip open scar debridement ;  Surgeon: Paralee Cancel, MD;  Location: [facility];  Service: Orthopedics;  Laterality: Left;   shoulder cyst  06/04/11   right cyst removed   TONSILLECTOMY     TOTAL HIP ARTHROPLASTY Left 08/12/2015   Procedure: LEFT TOTAL HIP ARTHROPLASTY ANTERIOR APPROACH;  Surgeon: Paralee Cancel, MD;  Location: [facility];  Service: Orthopedics;  Laterality: Left;   TOTAL KNEE ARTHROPLASTY Left 11/20/2012   Procedure: CONVERT LEFT UNI KNEE ARTHROPLASTY TO LEFT TOTAL KNEE ARTHROPLASTY;  Surgeon: Mauri Pole, MD;  Location: [facility];  Service:  Orthopedics;  Laterality: Left;   TOTAL KNEE REVISION WITH SCAR DEBRIDEMENT/PATELLA REVISION WITH POLY EXCHANGE Left 08/03/2016   Procedure: Left knee open scar debridement, poly exchange ;  Surgeon: Paralee Cancel, MD;  Location: [facility];  Service: Orthopedics;  Laterality: Left;   UMBILICAL HERNIA REPAIR  06/04/2011   Procedure: HERNIA REPAIR UMBILICAL ADULT;  Surgeon: Rolm Bookbinder, MD;  Location: [place];  Service: General;   Laterality: N/A;  umbilical hernia repiar and mesh and removal of lympoma on right shoulder    No current facility-administered medications for this encounter.   Current Outpatient Medications  Medication Sig Dispense Refill Last Dose   diclofenac (VOLTAREN) 75 MG EC tablet Take 75 mg by mouth 2 (two) times daily.      fexofenadine (ALLEGRA) 180 MG tablet Take 180 mg by mouth daily.      No Known Allergies  Social History   Tobacco Use   Smoking status: Never   Smokeless tobacco: Never  Substance Use Topics   Alcohol use: Yes    Comment: occassionally    No family history on file.    Review of Systems  Constitutional:  Negative for chills and fever.  Respiratory:  Negative for cough and shortness of breath.   Cardiovascular:  Negative for chest pain.  Gastrointestinal:  Negative for nausea and vomiting.  Musculoskeletal:  Positive for arthralgias.     Objective:  Physical Exam Well nourished and well developed. General: Alert and oriented x3, cooperative and pleasant, no acute distress. Head: normocephalic, atraumatic, neck supple. Eyes: EOMI.  Musculoskeletal: Left knee exam: Healed surgical incision, no warmth erythema no effusion His range of motion he has basically full knee extension with good quad tone but only flexes with pressure to close to 80 degrees. He demonstrates the challenges of trying to get his foot underneath him to get up from a seated position.  Calves soft and nontender. Motor function intact in LE. Strength 5/5 LE bilaterally. Neuro: Distal pulses 2+. Sensation to light touch intact in LE.  Vital signs in last 24 hours:    Labs:  Estimated body mass index is 28.88 kg/m as calculated from the following:   Height as of 04/16/21: 5' 11.5" (1.816 m).   Weight as of 04/16/21: 95.3 kg.  Imaging Review Plain radiographs demonstrate stable well fixed and aligned femoral and tibial components. No evidence of any complications involving the  components in all 3 compartments.    Assessment/Plan:  arthrofibrosis, left knee(s) with failed previous arthroplasty.   The patient history, physical examination, clinical judgment of the provider and imaging studies are consistent with end stage degenerative joint disease of the left knee(s), previous total knee arthroplasty. Revision total knee arthroplasty is deemed medically necessary. The treatment options including medical management, injection therapy, arthroscopy and revision arthroplasty were discussed at length. The risks and benefits of revision total knee arthroplasty were presented and reviewed. The risks due to aseptic loosening, infection, stiffness, patella tracking problems, thromboembolic complications and other imponderables were discussed. The patient acknowledged the explanation, agreed to proceed with the plan and consent was signed. Patient is being admitted for inpatient treatment for surgery, pain control, PT, OT, prophylactic antibiotics, VTE prophylaxis, progressive ambulation and ADL's and discharge planning.The patient is planning to be discharged  home.   Therapy Plans: outpatient therapy at [facility] Disposition: Home with wife Planned DVT Prophylaxis: aspirin 81mg  BID DME needed: none PCP: Dr. Osborne Casco, clearance received TXA: IV Allergies: NKDA Anesthesia Concerns: none BMI: 28.4  Last HgbA1c: Not diabetic  Other: - Oxycodone, celebrex, tylenol, robaxin, toradol - Wants rx for trazodone  Griffith Citron, PA-C Orthopedic Surgery [facility] [place] [phone]

## 2021-04-23 NOTE — Progress Notes (Signed)
Dr. Alvan Dame states patient called him about being out of town for original scheduled date and he will not return until day before surgery. This nurse spoke with patients wife and educated to be here early for appt. (1000) on 04/28/21 for test on arrival.

## 2021-04-28 ENCOUNTER — Observation Stay (HOSPITAL_COMMUNITY)
Admission: RE | Admit: 2021-04-28 | Discharge: 2021-04-29 | Disposition: A | Discharge status: Home / Self Care | Payer: BC Managed Care – PPO | Source: Ambulatory Visit | Attending: Orthopedic Surgery | Admitting: Orthopedic Surgery

## 2021-04-28 ENCOUNTER — Encounter (HOSPITAL_COMMUNITY): Payer: Self-pay | Admitting: Orthopedic Surgery

## 2021-04-28 ENCOUNTER — Ambulatory Visit (HOSPITAL_COMMUNITY): Payer: BC Managed Care – PPO | Admitting: Certified Registered"

## 2021-04-28 ENCOUNTER — Ambulatory Visit (HOSPITAL_COMMUNITY): Payer: BC Managed Care – PPO | Admitting: Physician Assistant

## 2021-04-28 ENCOUNTER — Encounter (HOSPITAL_COMMUNITY)
Admission: RE | Disposition: A | Discharge status: Home / Self Care | Payer: Self-pay | Source: Ambulatory Visit | Attending: Orthopedic Surgery

## 2021-04-28 ENCOUNTER — Other Ambulatory Visit: Payer: Self-pay

## 2021-04-28 DIAGNOSIS — Z01812 Encounter for preprocedural laboratory examination: Secondary | ICD-10-CM

## 2021-04-28 DIAGNOSIS — Z87442 Personal history of urinary calculi: Secondary | ICD-10-CM | POA: Diagnosis not present

## 2021-04-28 DIAGNOSIS — Z96652 Presence of left artificial knee joint: Secondary | ICD-10-CM | POA: Diagnosis not present

## 2021-04-28 DIAGNOSIS — K219 Gastro-esophageal reflux disease without esophagitis: Secondary | ICD-10-CM | POA: Diagnosis not present

## 2021-04-28 DIAGNOSIS — T8482XA Fibrosis due to internal orthopedic prosthetic devices, implants and grafts, initial encounter: Secondary | ICD-10-CM | POA: Diagnosis not present

## 2021-04-28 DIAGNOSIS — G8918 Other acute postprocedural pain: Secondary | ICD-10-CM | POA: Diagnosis not present

## 2021-04-28 DIAGNOSIS — Z20822 Contact with and (suspected) exposure to covid-19: Secondary | ICD-10-CM | POA: Insufficient documentation

## 2021-04-28 DIAGNOSIS — M24662 Ankylosis, left knee: Secondary | ICD-10-CM | POA: Diagnosis not present

## 2021-04-28 DIAGNOSIS — T8484XA Pain due to internal orthopedic prosthetic devices, implants and grafts, initial encounter: Secondary | ICD-10-CM | POA: Diagnosis not present

## 2021-04-28 HISTORY — PX: TOTAL KNEE REVISION: SHX996

## 2021-04-28 LAB — SARS CORONAVIRUS 2 BY RT PCR (HOSPITAL ORDER, PERFORMED IN ~~LOC~~ HOSPITAL LAB): SARS Coronavirus 2: NEGATIVE

## 2021-04-28 SURGERY — TOTAL KNEE REVISION
Anesthesia: Regional | Site: Knee | Laterality: Left

## 2021-04-28 MED ORDER — ONDANSETRON HCL 4 MG/2ML IJ SOLN: 4.0000 mg | Freq: Four times a day (QID) | INTRAMUSCULAR | Status: DC | PRN

## 2021-04-28 MED ORDER — METHOCARBAMOL 500 MG PO TABS
500.0000 mg | ORAL_TABLET | Freq: Four times a day (QID) | ORAL | Status: DC | PRN
  Administered 2021-04-29: 500 mg via ORAL
  Filled 2021-04-28 (×2): qty 1

## 2021-04-28 MED ORDER — STERILE WATER FOR IRRIGATION IR SOLN
Status: DC | PRN
  Administered 2021-04-28: 2000 mL

## 2021-04-28 MED ORDER — DEXAMETHASONE SODIUM PHOSPHATE 10 MG/ML IJ SOLN
10.0000 mg | Freq: Once | INTRAMUSCULAR | Status: AC
  Administered 2021-04-29: 10 mg via INTRAVENOUS
  Filled 2021-04-28: qty 1

## 2021-04-28 MED ORDER — ASPIRIN 81 MG PO CHEW
81.0000 mg | CHEWABLE_TABLET | Freq: Two times a day (BID) | ORAL | Status: DC
  Administered 2021-04-28 – 2021-04-29 (×2): 81 mg via ORAL
  Filled 2021-04-28 (×2): qty 1

## 2021-04-28 MED ORDER — PHENOL 1.4 % MT LIQD: 1.0000 | OROMUCOSAL | Status: DC | PRN

## 2021-04-28 MED ORDER — ONDANSETRON HCL 4 MG PO TABS: 4.0000 mg | ORAL_TABLET | Freq: Four times a day (QID) | ORAL | Status: DC | PRN

## 2021-04-28 MED ORDER — KETOROLAC TROMETHAMINE 30 MG/ML IJ SOLN
INTRAMUSCULAR | Status: DC | PRN
  Administered 2021-04-28: 30 mg

## 2021-04-28 MED ORDER — MIDAZOLAM HCL 2 MG/2ML IJ SOLN
INTRAMUSCULAR | Status: AC
  Filled 2021-04-28: qty 2

## 2021-04-28 MED ORDER — PROPOFOL 10 MG/ML IV BOLUS
INTRAVENOUS | Status: DC | PRN
  Administered 2021-04-28 (×2): 10 mg via INTRAVENOUS

## 2021-04-28 MED ORDER — DEXAMETHASONE SODIUM PHOSPHATE 10 MG/ML IJ SOLN
INTRAMUSCULAR | Status: DC | PRN
  Administered 2021-04-28: 5 mg

## 2021-04-28 MED ORDER — FENTANYL CITRATE (PF) 100 MCG/2ML IJ SOLN
INTRAMUSCULAR | Status: AC
  Filled 2021-04-28: qty 2

## 2021-04-28 MED ORDER — MIDAZOLAM HCL 2 MG/2ML IJ SOLN
INTRAMUSCULAR | Status: DC | PRN
  Administered 2021-04-28 (×2): 1 mg via INTRAVENOUS

## 2021-04-28 MED ORDER — KETOROLAC TROMETHAMINE 30 MG/ML IJ SOLN
INTRAMUSCULAR | Status: AC
  Filled 2021-04-28: qty 1

## 2021-04-28 MED ORDER — CEFAZOLIN SODIUM-DEXTROSE 2-4 GM/100ML-% IV SOLN
2.0000 g | Freq: Four times a day (QID) | INTRAVENOUS | Status: AC
  Administered 2021-04-28 – 2021-04-29 (×2): 2 g via INTRAVENOUS
  Filled 2021-04-28 (×2): qty 100

## 2021-04-28 MED ORDER — EPHEDRINE SULFATE-NACL 50-0.9 MG/10ML-% IV SOSY
PREFILLED_SYRINGE | INTRAVENOUS | Status: DC | PRN
  Administered 2021-04-28 (×2): 5 mg via INTRAVENOUS

## 2021-04-28 MED ORDER — ONDANSETRON HCL 4 MG/2ML IJ SOLN
INTRAMUSCULAR | Status: AC
  Filled 2021-04-28: qty 2

## 2021-04-28 MED ORDER — BUPIVACAINE-EPINEPHRINE 0.25% -1:200000 IJ SOLN
INTRAMUSCULAR | Status: DC | PRN
  Administered 2021-04-28: 30 mL

## 2021-04-28 MED ORDER — METOCLOPRAMIDE HCL 5 MG PO TABS: 5.0000 mg | ORAL_TABLET | Freq: Three times a day (TID) | ORAL | Status: DC | PRN

## 2021-04-28 MED ORDER — LORATADINE 10 MG PO TABS
10.0000 mg | ORAL_TABLET | Freq: Every day | ORAL | Status: DC
  Administered 2021-04-29: 10 mg via ORAL
  Filled 2021-04-28: qty 1

## 2021-04-28 MED ORDER — DIPHENHYDRAMINE HCL 12.5 MG/5ML PO ELIX: 12.5000 mg | ORAL_SOLUTION | ORAL | Status: DC | PRN

## 2021-04-28 MED ORDER — OXYCODONE HCL 5 MG PO TABS
5.0000 mg | ORAL_TABLET | ORAL | Status: DC | PRN
  Administered 2021-04-29: 10 mg via ORAL
  Filled 2021-04-28 (×3): qty 2

## 2021-04-28 MED ORDER — SODIUM CHLORIDE 0.9 % IR SOLN
Status: DC | PRN
  Administered 2021-04-28: 3000 mL

## 2021-04-28 MED ORDER — CELECOXIB 200 MG PO CAPS
200.0000 mg | ORAL_CAPSULE | Freq: Two times a day (BID) | ORAL | Status: DC
  Administered 2021-04-29: 200 mg via ORAL
  Filled 2021-04-28: qty 1

## 2021-04-28 MED ORDER — FENTANYL CITRATE PF 50 MCG/ML IJ SOSY
25.0000 ug | PREFILLED_SYRINGE | INTRAMUSCULAR | Status: DC | PRN
  Administered 2021-04-28: 50 ug via INTRAVENOUS

## 2021-04-28 MED ORDER — KETOROLAC TROMETHAMINE 15 MG/ML IJ SOLN
7.5000 mg | Freq: Four times a day (QID) | INTRAMUSCULAR | Status: AC
  Administered 2021-04-28 – 2021-04-29 (×2): 7.5 mg via INTRAVENOUS
  Filled 2021-04-28 (×2): qty 1

## 2021-04-28 MED ORDER — BUPIVACAINE IN DEXTROSE 0.75-8.25 % IT SOLN
INTRATHECAL | Status: DC | PRN
  Administered 2021-04-28: 2 mL via INTRATHECAL

## 2021-04-28 MED ORDER — DEXAMETHASONE SODIUM PHOSPHATE 10 MG/ML IJ SOLN
8.0000 mg | Freq: Once | INTRAMUSCULAR | Status: AC
  Administered 2021-04-28: 8 mg via INTRAVENOUS

## 2021-04-28 MED ORDER — EPHEDRINE 5 MG/ML INJ
INTRAVENOUS | Status: AC
  Filled 2021-04-28: qty 5

## 2021-04-28 MED ORDER — ROPIVACAINE HCL 5 MG/ML IJ SOLN
INTRAMUSCULAR | Status: DC | PRN
  Administered 2021-04-28: 20 mL via PERINEURAL

## 2021-04-28 MED ORDER — ACETAMINOPHEN 325 MG PO TABS: 325.0000 mg | ORAL_TABLET | Freq: Four times a day (QID) | ORAL | Status: DC | PRN

## 2021-04-28 MED ORDER — PROPOFOL 1000 MG/100ML IV EMUL
INTRAVENOUS | Status: AC
  Filled 2021-04-28: qty 100

## 2021-04-28 MED ORDER — SODIUM CHLORIDE (PF) 0.9 % IJ SOLN
INTRAMUSCULAR | Status: AC
  Filled 2021-04-28: qty 30

## 2021-04-28 MED ORDER — ACETAMINOPHEN 500 MG PO TABS
1000.0000 mg | ORAL_TABLET | Freq: Once | ORAL | Status: AC
  Administered 2021-04-28: 1000 mg via ORAL
  Filled 2021-04-28: qty 2

## 2021-04-28 MED ORDER — BISACODYL 10 MG RE SUPP: 10.0000 mg | Freq: Every day | RECTAL | Status: DC | PRN

## 2021-04-28 MED ORDER — DOCUSATE SODIUM 100 MG PO CAPS
100.0000 mg | ORAL_CAPSULE | Freq: Two times a day (BID) | ORAL | Status: DC
  Administered 2021-04-28 – 2021-04-29 (×2): 100 mg via ORAL
  Filled 2021-04-28 (×2): qty 1

## 2021-04-28 MED ORDER — METHOCARBAMOL 500 MG IVPB - SIMPLE MED
500.0000 mg | Freq: Four times a day (QID) | INTRAVENOUS | Status: DC | PRN
  Administered 2021-04-28: 500 mg via INTRAVENOUS
  Filled 2021-04-28: qty 500
  Filled 2021-04-28: qty 50

## 2021-04-28 MED ORDER — HYDROMORPHONE HCL 1 MG/ML IJ SOLN
0.5000 mg | INTRAMUSCULAR | Status: DC | PRN
  Administered 2021-04-28 (×2): 1 mg via INTRAVENOUS
  Filled 2021-04-28 (×2): qty 1

## 2021-04-28 MED ORDER — FERROUS SULFATE 325 (65 FE) MG PO TABS
325.0000 mg | ORAL_TABLET | Freq: Three times a day (TID) | ORAL | Status: DC
  Administered 2021-04-28 – 2021-04-29 (×3): 325 mg via ORAL
  Filled 2021-04-28 (×3): qty 1

## 2021-04-28 MED ORDER — FENTANYL CITRATE (PF) 100 MCG/2ML IJ SOLN
INTRAMUSCULAR | Status: DC | PRN
  Administered 2021-04-28: 50 ug via INTRAVENOUS

## 2021-04-28 MED ORDER — DEXAMETHASONE SODIUM PHOSPHATE 10 MG/ML IJ SOLN
INTRAMUSCULAR | Status: AC
  Filled 2021-04-28: qty 1

## 2021-04-28 MED ORDER — ONDANSETRON HCL 4 MG/2ML IJ SOLN
INTRAMUSCULAR | Status: DC | PRN
  Administered 2021-04-28: 4 mg via INTRAVENOUS

## 2021-04-28 MED ORDER — CEFAZOLIN SODIUM-DEXTROSE 2-4 GM/100ML-% IV SOLN
2.0000 g | INTRAVENOUS | Status: AC
  Administered 2021-04-28: 2 g via INTRAVENOUS
  Filled 2021-04-28: qty 100

## 2021-04-28 MED ORDER — PENTOXIFYLLINE ER 400 MG PO TBCR
400.0000 mg | EXTENDED_RELEASE_TABLET | Freq: Two times a day (BID) | ORAL | Status: DC
  Administered 2021-04-28 – 2021-04-29 (×2): 400 mg via ORAL
  Filled 2021-04-28 (×3): qty 1

## 2021-04-28 MED ORDER — PROPOFOL 500 MG/50ML IV EMUL
INTRAVENOUS | Status: DC | PRN
Start: 2021-04-28 — End: 2021-04-28
  Administered 2021-04-28: 100 ug/kg/min via INTRAVENOUS

## 2021-04-28 MED ORDER — POVIDONE-IODINE 10 % EX SWAB: 2.0000 "application " | Freq: Once | CUTANEOUS | Status: DC

## 2021-04-28 MED ORDER — 0.9 % SODIUM CHLORIDE (POUR BTL) OPTIME
TOPICAL | Status: DC | PRN
  Administered 2021-04-28: 1000 mL

## 2021-04-28 MED ORDER — LACTATED RINGERS IV SOLN: INTRAVENOUS | Status: DC

## 2021-04-28 MED ORDER — METOCLOPRAMIDE HCL 5 MG/ML IJ SOLN: 5.0000 mg | Freq: Three times a day (TID) | INTRAMUSCULAR | Status: DC | PRN

## 2021-04-28 MED ORDER — POLYETHYLENE GLYCOL 3350 17 G PO PACK: 17.0000 g | PACK | Freq: Every day | ORAL | Status: DC | PRN

## 2021-04-28 MED ORDER — FENTANYL CITRATE PF 50 MCG/ML IJ SOSY
PREFILLED_SYRINGE | INTRAMUSCULAR | Status: AC
  Filled 2021-04-28: qty 1

## 2021-04-28 MED ORDER — TRANEXAMIC ACID-NACL 1000-0.7 MG/100ML-% IV SOLN
1000.0000 mg | Freq: Once | INTRAVENOUS | Status: AC
  Administered 2021-04-28: 1000 mg via INTRAVENOUS
  Filled 2021-04-28: qty 100

## 2021-04-28 MED ORDER — FENTANYL CITRATE PF 50 MCG/ML IJ SOSY
50.0000 ug | PREFILLED_SYRINGE | INTRAMUSCULAR | Status: DC
Start: 2021-04-28 — End: 2021-04-28
  Administered 2021-04-28: 100 ug via INTRAVENOUS
  Filled 2021-04-28: qty 2

## 2021-04-28 MED ORDER — MIDAZOLAM HCL 2 MG/2ML IJ SOLN
1.0000 mg | INTRAMUSCULAR | Status: DC
Start: 2021-04-28 — End: 2021-04-28
  Administered 2021-04-28: 2 mg via INTRAVENOUS
  Filled 2021-04-28: qty 2

## 2021-04-28 MED ORDER — OXYCODONE HCL 5 MG PO TABS
10.0000 mg | ORAL_TABLET | ORAL | Status: DC | PRN
  Administered 2021-04-28: 15 mg via ORAL
  Administered 2021-04-29 (×2): 10 mg via ORAL
  Filled 2021-04-28: qty 3

## 2021-04-28 MED ORDER — SODIUM CHLORIDE 0.9 % IV SOLN: INTRAVENOUS | Status: DC

## 2021-04-28 MED ORDER — PROPOFOL 10 MG/ML IV BOLUS
INTRAVENOUS | Status: AC
  Filled 2021-04-28: qty 20

## 2021-04-28 MED ORDER — EPHEDRINE 5 MG/ML INJ
INTRAVENOUS | Status: AC
  Filled 2021-04-28: qty 10

## 2021-04-28 MED ORDER — MENTHOL 3 MG MT LOZG: 1.0000 | LOZENGE | OROMUCOSAL | Status: DC | PRN

## 2021-04-28 MED ORDER — SODIUM CHLORIDE (PF) 0.9 % IJ SOLN
INTRAMUSCULAR | Status: DC | PRN
  Administered 2021-04-28: 30 mL

## 2021-04-28 MED ORDER — BUPIVACAINE-EPINEPHRINE (PF) 0.25% -1:200000 IJ SOLN
INTRAMUSCULAR | Status: AC
  Filled 2021-04-28: qty 30

## 2021-04-28 MED ORDER — TRANEXAMIC ACID-NACL 1000-0.7 MG/100ML-% IV SOLN
1000.0000 mg | INTRAVENOUS | Status: AC
  Administered 2021-04-28: 1000 mg via INTRAVENOUS
  Filled 2021-04-28: qty 100

## 2021-04-28 SURGICAL SUPPLY — 70 items
ADH SKN CLS APL DERMABOND .7 (GAUZE/BANDAGES/DRESSINGS) ×1
ATTUNE MED ANAT PAT 38 KNEE (Knees) ×2 IMPLANT
ATTUNE PSRP INSR SZ6 8 KNEE (Insert) ×2 IMPLANT
ATUNE CEMENTED STEM 16X80 (Stem) ×2 IMPLANT
AUG FEM SZ6 8 REV DIST STRL LF (Miscellaneous) ×2 IMPLANT
AUGMENT DISTAL FEM SZ6 8 KNEE (Miscellaneous) ×4 IMPLANT
BAG COUNTER SPONGE SURGICOUNT (BAG) IMPLANT
BAG DECANTER FOR FLEXI CONT (MISCELLANEOUS) IMPLANT
BAG SPEC THK2 15X12 ZIP CLS (MISCELLANEOUS)
BAG SPNG CNTER NS LX DISP (BAG)
BAG ZIPLOCK 12X15 (MISCELLANEOUS) IMPLANT
BLADE SAW SGTL 11.0X1.19X90.0M (BLADE) ×2 IMPLANT
BLADE SAW SGTL 13.0X1.19X90.0M (BLADE) ×2 IMPLANT
BLADE SAW SGTL 81X20 HD (BLADE) ×2 IMPLANT
BLADE SURG SZ10 CARB STEEL (BLADE) ×4 IMPLANT
BNDG ELASTIC 6X5.8 VLCR STR LF (GAUZE/BANDAGES/DRESSINGS) ×2 IMPLANT
BRUSH FEMORAL CANAL (MISCELLANEOUS) ×2 IMPLANT
BSPLAT TIB 5 CMNT REV ROT PLAT (Orthopedic Implant) ×1 IMPLANT
CEMENT BONE R 1X40 (Cement) ×4 IMPLANT
COMP FEM ATTUNE CRS SZ6 LT (Femur) ×2 IMPLANT
COMPONENT FEM ATN CRS SZ6 LT (Femur) ×1 IMPLANT
COVER SURGICAL LIGHT HANDLE (MISCELLANEOUS) ×2 IMPLANT
CUFF TOURN SGL QUICK 34 (TOURNIQUET CUFF) ×2
CUFF TRNQT CYL 34X4.125X (TOURNIQUET CUFF) ×1 IMPLANT
DECANTER SPIKE VIAL GLASS SM (MISCELLANEOUS) IMPLANT
DERMABOND ADVANCED (GAUZE/BANDAGES/DRESSINGS) ×1
DERMABOND ADVANCED .7 DNX12 (GAUZE/BANDAGES/DRESSINGS) ×1 IMPLANT
DRAPE INCISE IOBAN 66X45 STRL (DRAPES) ×2 IMPLANT
DRAPE U-SHAPE 47X51 STRL (DRAPES) ×2 IMPLANT
DRESSING AQUACEL AG SP 3.5X10 (GAUZE/BANDAGES/DRESSINGS) IMPLANT
DRSG AQUACEL AG ADV 3.5X10 (GAUZE/BANDAGES/DRESSINGS) ×2 IMPLANT
DRSG AQUACEL AG ADV 3.5X14 (GAUZE/BANDAGES/DRESSINGS) ×2 IMPLANT
DRSG AQUACEL AG SP 3.5X10 (GAUZE/BANDAGES/DRESSINGS)
DURAPREP 26ML APPLICATOR (WOUND CARE) ×4 IMPLANT
ELECT REM PT RETURN 15FT ADLT (MISCELLANEOUS) ×2 IMPLANT
GAUZE SPONGE 2X2 8PLY STRL LF (GAUZE/BANDAGES/DRESSINGS) IMPLANT
GLOVE SURG UNDER POLY LF SZ7.5 (GLOVE) ×4 IMPLANT
GOWN STRL REUS W/TWL LRG LVL3 (GOWN DISPOSABLE) ×2 IMPLANT
HANDPIECE INTERPULSE COAX TIP (DISPOSABLE) ×2
HOLDER FOLEY CATH W/STRAP (MISCELLANEOUS) IMPLANT
IRRIGATION SURGIPHOR STRL (IV SOLUTION) ×2 IMPLANT
KIT TURNOVER KIT A (KITS) IMPLANT
MANIFOLD NEPTUNE II (INSTRUMENTS) ×2 IMPLANT
NDL SAFETY ECLIPSE 18X1.5 (NEEDLE) IMPLANT
NEEDLE HYPO 18GX1.5 SHARP (NEEDLE)
NS IRRIG 1000ML POUR BTL (IV SOLUTION) ×2 IMPLANT
PACK TOTAL KNEE CUSTOM (KITS) ×2 IMPLANT
PLATE REV TIB BAS ROT SZ5 KNEE (Orthopedic Implant) ×1 IMPLANT
PROTECTOR NERVE ULNAR (MISCELLANEOUS) ×2 IMPLANT
RESTRICTOR CEMENT SZ 5 C-STEM (Cement) ×2 IMPLANT
REV TIB BASE ROT PLAT SZ5 KNEE (Orthopedic Implant) ×2 IMPLANT
SET HNDPC FAN SPRY TIP SCT (DISPOSABLE) ×1 IMPLANT
SET PAD KNEE POSITIONER (MISCELLANEOUS) ×2 IMPLANT
SLEEVE TIB ATTUNE FP 37 (Knees) ×2 IMPLANT
SPONGE GAUZE 2X2 STER 10/PKG (GAUZE/BANDAGES/DRESSINGS)
STAPLER VISISTAT 35W (STAPLE) IMPLANT
STEM ATTUNE CEMENTED 16X80 (Stem) ×1 IMPLANT
STEM STR ATTUNE PF 16X60 (Knees) ×2 IMPLANT
SUT MNCRL AB 3-0 PS2 18 (SUTURE) ×2 IMPLANT
SUT STRATAFIX PDS+ 0 24IN (SUTURE) ×2 IMPLANT
SUT VIC AB 1 CT1 36 (SUTURE) ×4 IMPLANT
SUT VIC AB 2-0 CT1 27 (SUTURE) ×6
SUT VIC AB 2-0 CT1 TAPERPNT 27 (SUTURE) ×3 IMPLANT
SYR 50ML LL SCALE MARK (SYRINGE) IMPLANT
TOWER CARTRIDGE SMART MIX (DISPOSABLE) ×2 IMPLANT
TRAY FOLEY MTR SLVR 16FR STAT (SET/KITS/TRAYS/PACK) ×2 IMPLANT
TUBE KAMVAC SUCTION (TUBING) IMPLANT
TUBE SUCTION HIGH CAP CLEAR NV (SUCTIONS) ×2 IMPLANT
WATER STERILE IRR 1000ML POUR (IV SOLUTION) ×2 IMPLANT
WRAP KNEE MAXI GEL POST OP (GAUZE/BANDAGES/DRESSINGS) ×2 IMPLANT

## 2021-04-28 NOTE — Interval H&P Note (Signed)
History and Physical Interval Note:  04/28/2021 11:29 AM  Harry Cobb  has presented today for surgery, with the diagnosis of Failed left total knee arthroplasty.  The various methods of treatment have been discussed with the patient and family. After consideration of risks, benefits and other options for treatment, the patient has consented to  Procedure(s): TOTAL KNEE REVISION (Left) as a surgical intervention.  The patient's history has been reviewed, patient examined, no change in status, stable for surgery.  I have reviewed the patient's chart and labs.  Questions were answered to the patient's satisfaction.     Mauri Pole

## 2021-04-28 NOTE — Anesthesia Procedure Notes (Signed)
Anesthesia Regional Block: Adductor canal block   Pre-Anesthetic Checklist: , timeout performed,  Correct Patient, Correct Site, Correct Laterality,  Correct Procedure, Correct Position, site marked,  Risks and benefits discussed,  Surgical consent,  Pre-op evaluation,  At surgeon's request and post-op pain management  Laterality: Left  Prep: Maximum Sterile Barrier Precautions used, chloraprep       Needles:  Injection technique: Single-shot  Needle Type: Echogenic Stimulator Needle     Needle Length: 9cm  Needle Gauge: 22     Additional Needles:   Procedures:,,,, ultrasound used (permanent image in chart),,    Narrative:  Start time: 04/28/2021 12:15 PM End time: 04/28/2021 12:18 PM Injection made incrementally with aspirations every 5 mL.  Performed by: Personally  Anesthesiologist: Freddrick March, MD  Additional Notes: Monitors applied. No increased pain on injection. No increased resistance to injection. Injection made in 5cc increments. Good needle visualization. Patient tolerated procedure well.

## 2021-04-28 NOTE — Anesthesia Postprocedure Evaluation (Signed)
Anesthesia Post Note  Patient: Harry Cobb  Procedure(s) Performed: TOTAL KNEE REVISION (Left: Knee)     Patient location during evaluation: PACU Anesthesia Type: Regional and Spinal Level of consciousness: oriented and awake and alert Pain management: pain level controlled Vital Signs Assessment: post-procedure vital signs reviewed and stable Respiratory status: spontaneous breathing, respiratory function stable and patient connected to nasal cannula oxygen Cardiovascular status: blood pressure returned to baseline and stable Postop Assessment: no headache, no backache and no apparent nausea or vomiting Anesthetic complications: no   No notable events documented.  Last Vitals:  Vitals:   04/28/21 1645 04/28/21 1710  BP: 116/78 125/81  Pulse: (!) 56 (!) 50  Resp: 15   Temp:  36.6 C  SpO2: 93% 94%    Last Pain:  Vitals:   04/28/21 1710  TempSrc: Oral  PainSc:                  Nomie Buchberger L Taisa Deloria

## 2021-04-28 NOTE — Progress Notes (Signed)
Orthopedic Tech Progress Note Patient Details:  Harry Cobb 1961-03-13 [id number]  Patient ID: [name], male   DOB: 1961/03/05, 60 y.o.   MRN: [id number]  Anav, Lammert 04/28/2021, 8:53 PM Cpm removed from pt

## 2021-04-28 NOTE — Progress Notes (Signed)
Orthopedic Tech Progress Note Patient Details:  Harry Cobb 1961-05-29 [id number]  Patient ID: Harry Cobb, male   DOB: 08-29-1960, 60 y.o.   MRN: [id number]  Harry, Cobb 04/28/2021, 5:13 PM Cpm placed on patient in room

## 2021-04-28 NOTE — Anesthesia Preprocedure Evaluation (Addendum)
Anesthesia Evaluation  Patient identified by MRN, date of birth, ID band Patient awake    Reviewed: Allergy & Precautions, NPO status , Patient's Chart, lab work & pertinent test results  Airway Mallampati: III  TM Distance: >3 FB Neck ROM: Full    Dental no notable dental hx. (+) Teeth Intact, Dental Advisory Given   Pulmonary neg pulmonary ROS,    Pulmonary exam normal breath sounds clear to auscultation       Cardiovascular negative cardio ROS Normal cardiovascular exam Rhythm:Regular Rate:Normal     Neuro/Psych negative neurological ROS  negative psych ROS   GI/Hepatic Neg liver ROS, GERD  ,  Endo/Other  negative endocrine ROS  Renal/GU negative Renal ROS  negative genitourinary   Musculoskeletal negative musculoskeletal ROS (+)   Abdominal   Peds  Hematology negative hematology ROS (+)   Anesthesia Other Findings   Reproductive/Obstetrics                            Anesthesia Physical Anesthesia Plan  ASA: 2  Anesthesia Plan: Regional and Spinal   Post-op Pain Management:  Regional for Post-op pain   Induction: Intravenous  PONV Risk Score and Plan: 1 and Ondansetron, Dexamethasone, Midazolam and Propofol infusion  Airway Management Planned: Natural Airway  Additional Equipment:   Intra-op Plan:   Post-operative Plan: Extubation in OR  Informed Consent: I have reviewed the patients History and Physical, chart, labs and discussed the procedure including the risks, benefits and alternatives for the proposed anesthesia with the patient or authorized representative who has indicated his/her understanding and acceptance.     Dental advisory given  Plan Discussed with: CRNA  Anesthesia Plan Comments:        Anesthesia Quick Evaluation

## 2021-04-28 NOTE — Progress Notes (Signed)
AssistedDr. Hulan Fray with left, ultrasound guided, adductor canal block. Side rails up, monitors on throughout procedure. See vital signs in flow sheet. Tolerated Procedure well.

## 2021-04-28 NOTE — Brief Op Note (Signed)
04/28/2021  11:35 AM  PATIENT:  Harry Cobb  60 y.o. male  PRE-OPERATIVE DIAGNOSIS:  Failed left total knee arthroplasty due to arthrofibrosis and pain  POST-OPERATIVE DIAGNOSIS:  Failed left total knee arthroplasty due to arthrofibrosis and pain  PROCEDURE:  Procedure(s): TOTAL KNEE REVISION (Left)  SURGEON:  Surgeon(s) and Role:    Paralee Cancel, MD - Primary  PHYSICIAN ASSISTANT: Costella Hatcher, PA-C  ANESTHESIA:   regional and spinal  EBL: <150cc   BLOOD ADMINISTERED:none  DRAINS: none   LOCAL MEDICATIONS USED:  MARCAINE     SPECIMEN:  No Specimen  DISPOSITION OF SPECIMEN:  N/A  COUNTS:  YES  TOURNIQUET:  96 min at 250 mmHg  DICTATION: .Other Dictation: Dictation Number [id number]  PLAN OF CARE: Admit for overnight observation  PATIENT DISPOSITION:  PACU - hemodynamically stable.   Delay start of Pharmacological VTE agent (>24hrs) due to surgical blood loss or risk of bleeding: no

## 2021-04-28 NOTE — Transfer of Care (Signed)
Immediate Anesthesia Transfer of Care Note  Patient: Harry Cobb  Procedure(s) Performed: TOTAL KNEE REVISION (Left: Knee)  Patient Location: PACU  Anesthesia Type:MAC and Spinal  Level of Consciousness: awake, alert , oriented and patient cooperative  Airway & Oxygen Therapy: Patient Spontanous Breathing and Patient connected to face mask oxygen  Post-op Assessment: Report given to RN and Post -op Vital signs reviewed and stable  Post vital signs: Reviewed and stable  Last Vitals:  Vitals Value Taken Time  BP 127/86 04/28/21 1605  Temp    Pulse 58 04/28/21 1609  Resp 20 04/28/21 1609  SpO2 96 % 04/28/21 1609  Vitals shown include unvalidated device data.  Last Pain:  Vitals:   04/28/21 1223  TempSrc:   PainSc: 0-No pain         Complications: No notable events documented.

## 2021-04-28 NOTE — Anesthesia Procedure Notes (Signed)
Procedure Name: MAC Date/Time: 04/28/2021 1:00 PM Performed by: Eben Burow, CRNA Pre-anesthesia Checklist: Patient identified, Emergency Drugs available, Suction available, Patient being monitored and Timeout performed Oxygen Delivery Method: Simple face mask Placement Confirmation: positive ETCO2

## 2021-04-28 NOTE — Discharge Instructions (Addendum)

## 2021-04-28 NOTE — Anesthesia Procedure Notes (Signed)
Spinal  Patient location during procedure: OR Start time: 04/28/2021 1:05 PM End time: 04/28/2021 1:07 PM Reason for block: surgical anesthesia Staffing Performed: anesthesiologist  Anesthesiologist: Freddrick March, MD Preanesthetic Checklist Completed: patient identified, IV checked, risks and benefits discussed, surgical consent, monitors and equipment checked, pre-op evaluation and timeout performed Spinal Block Patient position: sitting Prep: DuraPrep and site prepped and draped Patient monitoring: cardiac monitor, continuous pulse ox and blood pressure Approach: midline Location: L3-4 Injection technique: single-shot Needle Needle type: Pencan  Needle gauge: 24 G Needle length: 9 cm Assessment Sensory level: T6 Events: CSF return Additional Notes Functioning IV was confirmed and monitors were applied. Sterile prep and drape, including hand hygiene and sterile gloves were used. The patient was positioned and the spine was prepped. The skin was anesthetized with lidocaine.  Free flow of clear CSF was obtained prior to injecting local anesthetic into the CSF.  The spinal needle aspirated freely following injection.  The needle was carefully withdrawn.  The patient tolerated the procedure well.

## 2021-04-29 ENCOUNTER — Encounter (HOSPITAL_COMMUNITY): Payer: Self-pay | Admitting: Orthopedic Surgery

## 2021-04-29 ENCOUNTER — Ambulatory Visit
Admission: RE | Admit: 2021-04-29 | Discharge: 2021-04-29 | Disposition: A | Discharge status: Home / Self Care | Payer: BC Managed Care – PPO | Source: Ambulatory Visit | Attending: Radiation Oncology | Admitting: Radiation Oncology

## 2021-04-29 ENCOUNTER — Ambulatory Visit
Admit: 2021-04-29 | Discharge: 2021-04-29 | Disposition: A | Discharge status: Home / Self Care | Payer: BC Managed Care – PPO | Attending: Radiation Oncology | Admitting: Radiation Oncology

## 2021-04-29 ENCOUNTER — Ambulatory Visit: Admit: 2021-04-29 | Payer: BC Managed Care – PPO

## 2021-04-29 DIAGNOSIS — Z96652 Presence of left artificial knee joint: Secondary | ICD-10-CM

## 2021-04-29 DIAGNOSIS — Z20822 Contact with and (suspected) exposure to covid-19: Secondary | ICD-10-CM | POA: Diagnosis not present

## 2021-04-29 DIAGNOSIS — M24662 Ankylosis, left knee: Secondary | ICD-10-CM | POA: Insufficient documentation

## 2021-04-29 LAB — BASIC METABOLIC PANEL
Anion gap: 9 (ref 5–15)
BUN: 23 mg/dL — ABNORMAL HIGH (ref 6–20)
CO2: 25 mmol/L (ref 22–32)
Calcium: 8.4 mg/dL — ABNORMAL LOW (ref 8.9–10.3)
Chloride: 104 mmol/L (ref 98–111)
Creatinine, Ser: 0.92 mg/dL (ref 0.61–1.24)
GFR, Estimated: >60 mL/min (ref >60)
Glucose, Bld: 173 mg/dL — ABNORMAL HIGH (ref 70–99)
Potassium: 4.1 mmol/L (ref 3.5–5.1)
Sodium: 138 mmol/L (ref 135–145)

## 2021-04-29 LAB — CBC
HCT: 37.2 % — ABNORMAL LOW (ref 39.0–52.0)
Hemoglobin: 12.8 g/dL — ABNORMAL LOW (ref 13.0–17.0)
MCH: 31.6 pg (ref 26.0–34.0)
MCHC: 34.4 g/dL (ref 30.0–36.0)
MCV: 91.9 fL (ref 80.0–100.0)
Platelets: 161 10*3/uL (ref 150–400)
RBC: 4.05 MIL/uL — ABNORMAL LOW (ref 4.22–5.81)
RDW: 12.8 % (ref 11.5–15.5)
WBC: 11 10*3/uL — ABNORMAL HIGH (ref 4.0–10.5)
nRBC: 0 % (ref 0.0–0.2)

## 2021-04-29 MED ORDER — TRAZODONE HCL 50 MG PO TABS: 50.0000 mg | ORAL_TABLET | Freq: Every day | ORAL | 0 refills | Status: DC

## 2021-04-29 MED ORDER — DOXYCYCLINE HYCLATE 50 MG PO CAPS: 100.0000 mg | ORAL_CAPSULE | Freq: Two times a day (BID) | ORAL | 0 refills | Status: AC

## 2021-04-29 MED ORDER — POLYETHYLENE GLYCOL 3350 17 G PO PACK: 17.0000 g | PACK | Freq: Every day | ORAL | 0 refills | Status: DC | PRN

## 2021-04-29 MED ORDER — OXYCODONE HCL 5 MG PO TABS: 5.0000 mg | ORAL_TABLET | ORAL | 0 refills | Status: DC | PRN

## 2021-04-29 MED ORDER — ASPIRIN 81 MG PO CHEW: 81.0000 mg | CHEWABLE_TABLET | Freq: Two times a day (BID) | ORAL | 0 refills | Status: AC

## 2021-04-29 MED ORDER — SURGIRINSE WOUND IRRIGATION SYSTEM - OPTIME: TOPICAL | Status: DC | PRN

## 2021-04-29 MED ORDER — ACETAMINOPHEN 325 MG PO TABS: 325.0000 mg | ORAL_TABLET | Freq: Four times a day (QID) | ORAL | Status: DC | PRN

## 2021-04-29 MED ORDER — METHOCARBAMOL 500 MG PO TABS
500.0000 mg | ORAL_TABLET | Freq: Four times a day (QID) | ORAL | 0 refills | Status: DC | PRN
Start: 2021-04-29 — End: 2021-06-10

## 2021-04-29 MED ORDER — DOCUSATE SODIUM 100 MG PO CAPS: 100.0000 mg | ORAL_CAPSULE | Freq: Two times a day (BID) | ORAL | 0 refills | Status: DC

## 2021-04-29 MED ORDER — CELECOXIB 200 MG PO CAPS: 200.0000 mg | ORAL_CAPSULE | Freq: Two times a day (BID) | ORAL | 0 refills | Status: DC

## 2021-04-29 MED ORDER — PENTOXIFYLLINE ER 400 MG PO TBCR: 400.0000 mg | EXTENDED_RELEASE_TABLET | Freq: Two times a day (BID) | ORAL | 0 refills | Status: AC

## 2021-04-29 NOTE — Progress Notes (Signed)
Radiation Oncology         [phone] ________________________________  Initial Inpatient Consultation  Name: Harry Cobb MRN: [id number]  Date: 04/29/2021  DOB: 03-24-1961  OI:[id number], Fransico Him, MD  Paralee Cancel, MD   REFERRING PHYSICIAN: Paralee Cancel, MD  DIAGNOSIS: The encounter diagnosis was S/P revision of total knee, left.  Left knee arthrofibrosis.   HISTORY OF PRESENT ILLNESS::Harry Cobb is a 60 y.o. male who is accompanied by his wife. he is seen as a courtesy of Dr. Alvan Dame for an opinion concerning radiation therapy as part of management for his diagnosed left knee arthrofibrosis.   The patient has a history of pain and functional disability in the left knee due to arthrofibrosis,  and has failed non-surgical conservative treatments for greater than 12 weeks including NSAID's and/or analgesics, flexibility and strengthening excercises, supervised PT with diminished ADL's post treatment, and activity modification.   The patient subsequently underwent total knee arthoplasty revision yesterday (04/28/21) under the care of Dr. Alvan Dame. Indications for this procedure were/are arthrofibrosis with limited flexion of the knee.  His surgical history includes conversion of left unicompartmental knee arthroplasty to total knee arthroplasty in 2014 by Dr. Alvan Dame. He subsequently developed arthrofibrosis despite extensive physical therapy. He also underwent open scar excision in 2018 by Dr. Alvan Dame with recurrent arthrofibrosis.   Postop radiation therapy has been requested to reduce chances for additional problems along the left knee.   PREVIOUS RADIATION THERAPY: No  PAST MEDICAL HISTORY:  Past Medical History:  Diagnosis Date   Arthritis    Back pain    radiates into the left posterior thigh to the calf.   GERD (gastroesophageal reflux disease)    History of kidney stones    Left shoulder pain    Leg pain    Worse than back pain   Lumbar foraminal stenosis    No pertinent  past medical history    Radiculopathy, lumbar region     PAST SURGICAL HISTORY: Past Surgical History:  Procedure Laterality Date   ABDOMINAL EXPOSURE N/A 06/23/2020   Procedure: ABDOMINAL EXPOSURE;  Surgeon: Marty Heck, MD;  Location: [facility] OR;  Service: Vascular;  Laterality: N/A;   ANTERIOR LUMBAR FUSION N/A 06/23/2020   Procedure: Anterior Lumbar Interbody Fusion - Lumbar five-Sacral one;  Surgeon: Eustace Moore, MD;  Location: [facility];  Service: Neurosurgery;  Laterality: N/A;   BACK SURGERY  2009   BICEPS TENDON REPAIR  2011   EYE SURGERY     Lasik   JOINT REPLACEMENT     both partial knee replacements   KNEE ARTHROSCOPY  11/12   lt   KNEE SURGERY  2009/2011   Rt and Lt knee replacment   LASER SPINE SURGERY     SCAR REVISION Left 08/03/2016   Procedure: Left hip open scar debridement ;  Surgeon: Paralee Cancel, MD;  Location: [facility];  Service: Orthopedics;  Laterality: Left;   shoulder cyst  06/04/11   right cyst removed   TONSILLECTOMY     TOTAL HIP ARTHROPLASTY Left 08/12/2015   Procedure: LEFT TOTAL HIP ARTHROPLASTY ANTERIOR APPROACH;  Surgeon: Paralee Cancel, MD;  Location: [facility];  Service: Orthopedics;  Laterality: Left;   TOTAL KNEE ARTHROPLASTY Left 11/20/2012   Procedure: CONVERT LEFT UNI KNEE ARTHROPLASTY TO LEFT TOTAL KNEE ARTHROPLASTY;  Surgeon: Mauri Pole, MD;  Location: [facility];  Service: Orthopedics;  Laterality: Left;   TOTAL KNEE REVISION Left 04/28/2021   Procedure: TOTAL KNEE REVISION;  Surgeon: Alvan Dame,  Rodman Key, MD;  Location: [facility];  Service: Orthopedics;  Laterality: Left;   TOTAL KNEE REVISION WITH SCAR DEBRIDEMENT/PATELLA REVISION WITH POLY EXCHANGE Left 08/03/2016   Procedure: Left knee open scar debridement, poly exchange ;  Surgeon: Paralee Cancel, MD;  Location: [facility];  Service: Orthopedics;  Laterality: Left;   UMBILICAL HERNIA REPAIR  06/04/2011   Procedure: HERNIA REPAIR UMBILICAL ADULT;  Surgeon: Rolm Bookbinder, MD;  Location: [place];  Service: General;  Laterality: N/A;  umbilical hernia repiar and mesh and removal of lympoma on right shoulder    FAMILY HISTORY: No family history on file.  SOCIAL HISTORY:  Social History   Tobacco Use   Smoking status: Never   Smokeless tobacco: Never  Vaping Use   Vaping Use: Never used  Substance Use Topics   Alcohol use: Yes    Comment: occassionally   Drug use: Never    ALLERGIES: No Known Allergies  MEDICATIONS:  No current facility-administered medications for this encounter.   No current outpatient medications on file.   Facility-Administered Medications Ordered in Other Encounters  Medication Dose Route Frequency Provider Last Rate Last Admin   0.9 %  sodium chloride infusion   Intravenous Continuous Irving Copas, PA-C   Stopped at 04/29/21 0920   acetaminophen (TYLENOL) tablet 325-650 mg  325-650 mg Oral Q6H PRN Irving Copas, PA-C       aspirin chewable tablet 81 mg  81 mg Oral BID Irving Copas, PA-C   81 mg at 04/29/21 7169   bisacodyl (DULCOLAX) suppository 10 mg  10 mg Rectal Daily PRN Irving Copas, PA-C       celecoxib (CELEBREX) capsule 200 mg  200 mg Oral BID Irving Copas, PA-C   200 mg at 04/29/21 6789   diphenhydrAMINE (BENADRYL) 12.5 MG/5ML elixir 12.5-25 mg  12.5-25 mg Oral Q4H PRN Irving Copas, PA-C       docusate sodium (COLACE) capsule 100 mg  100 mg Oral BID Irving Copas, PA-C   100 mg at 04/29/21 3810   ferrous sulfate tablet 325 mg  325 mg Oral TID PC Irving Copas, PA-C   325 mg at 04/29/21 1344   HYDROmorphone (DILAUDID) injection 0.5-1 mg  0.5-1 mg Intravenous Q4H PRN Irving Copas, PA-C   1 mg at 04/28/21 2205   loratadine (CLARITIN) tablet 10 mg  10 mg Oral Daily Irving Copas, PA-C   10 mg at 04/29/21 1751   menthol-cetylpyridinium (CEPACOL) lozenge 3 mg  1 lozenge Oral PRN Irving Copas, PA-C       Or   phenol (CHLORASEPTIC) mouth spray 1 spray  1 spray Mouth/Throat PRN Irving Copas, PA-C       methocarbamol (ROBAXIN) tablet 500 mg  500 mg Oral Q6H PRN Costella Hatcher R, PA-C   500 mg at 04/29/21 0416   Or   methocarbamol (ROBAXIN) 500 mg in dextrose 5 % 50 mL IVPB  500 mg Intravenous Q6H PRN Irving Copas, PA-C   Stopped at 04/28/21 1809   metoCLOPramide (REGLAN) tablet 5-10 mg  5-10 mg Oral Q8H PRN Irving Copas, PA-C       Or   metoCLOPramide (REGLAN) injection 5-10 mg  5-10 mg Intravenous Q8H PRN Irving Copas, PA-C       ondansetron [facility]) tablet 4 mg  4 mg Oral Q6H PRN Irving Copas, PA-C       Or   ondansetron [facility])  injection 4 mg  4 mg Intravenous Q6H PRN Irving Copas, PA-C       oxyCODONE (Oxy IR/ROXICODONE) immediate release tablet 10-15 mg  10-15 mg Oral Q4H PRN Irving Copas, PA-C   10 mg at 04/29/21 1554   oxyCODONE (Oxy IR/ROXICODONE) immediate release tablet 5-10 mg  5-10 mg Oral Q4H PRN Irving Copas, PA-C   10 mg at 04/29/21 0416   pentoxifylline (TRENTAL) CR tablet 400 mg  400 mg Oral BID Irving Copas, PA-C   400 mg at 04/29/21 6270   polyethylene glycol (MIRALAX / GLYCOLAX) packet 17 g  17 g Oral Daily PRN Irving Copas, PA-C       Surgiphor Wound Irrigation System - Optime 450 mL, Surgirinse Wound Irrigation System - Optime 450 mL irrigation    PRN Paralee Cancel, MD   Given at 04/28/21 1341    REVIEW OF SYSTEMS:  A 10+ POINT REVIEW OF SYSTEMS WAS OBTAINED including neurology, dermatology, psychiatry, cardiac, respiratory, lymph, extremities, GI, GU, musculoskeletal, constitutional, reproductive, HEENT.    PHYSICAL EXAM:  General: Alert and oriented, in no acute distress, sitting comfortably in wheelchair with left leg elevated horizontally.  Left knee region has been bandaged after his total knee replacement from yesterday.  Some swelling noted in the left knee area but no obvious infection.   ECOG = 1  0 - Asymptomatic (Fully active, able to carry on all predisease activities without  restriction)  1 - Symptomatic but completely ambulatory (Restricted in physically strenuous activity but ambulatory and able to carry out work of a light or sedentary nature. For example, light housework, office work)  2 - Symptomatic, <50% in bed during the day (Ambulatory and capable of all self care but unable to carry out any work activities. Up and about more than 50% of waking hours)  3 - Symptomatic, >50% in bed, but not bedbound (Capable of only limited self-care, confined to bed or chair 50% or more of waking hours)  4 - Bedbound (Completely disabled. Cannot carry on any self-care. Totally confined to bed or chair)  5 - Death   Eustace Pen MM, Creech RH, Tormey DC, et al. [phone]). "Toxicity and response criteria of the [facility]". [place] Oncol. 5 (6): 649-55  LABORATORY DATA:  Lab Results  Component Value Date   WBC 11.0 (H) 04/29/2021   HGB 12.8 (L) 04/29/2021   HCT 37.2 (L) 04/29/2021   MCV 91.9 04/29/2021   PLT 161 04/29/2021   NEUTROABS 2.6 06/19/2020   Lab Results  Component Value Date   NA 138 04/29/2021   K 4.1 04/29/2021   CL 104 04/29/2021   CO2 25 04/29/2021   GLUCOSE 173 (H) 04/29/2021   CREATININE 0.92 04/29/2021   CALCIUM 8.4 (L) 04/29/2021      RADIOGRAPHY: No results found.    IMPRESSION: Left knee arthrofibrosis.   The patient would be a good candidate for postoperative radiation therapy directed at the operative bed along the left knee.  I discussed the treatment in detail with the patient is wife.  He appears to understand and wished to proceed with postop radiation treatment.   We reviewed the anticipated acute and late sequelae associated with radiation in this setting.  The patient was encouraged to ask questions that I answered to the best of my ability.  A patient consent form was discussed and signed.  We retained a copy for our records.  The patient would like to proceed  with radiation and will be scheduled for  planning and treatment today.    PLAN: 7 Gy directed at the operative bed along the left knee.   30 minutes of total time was spent for this patient encounter, including preparation, face-to-face counseling with the patient and coordination of care, physical exam, and documentation of the encounter.   ------------------------------------------------  Blair Promise, PhD, MD  This document serves as a record of services personally performed by Gery Pray, MD. It was created on his behalf by Roney Mans, a trained medical scribe. The creation of this record is based on the scribe's personal observations and the provider's statements to them. This document has been checked and approved by the attending provider.

## 2021-04-29 NOTE — Plan of Care (Signed)

## 2021-04-29 NOTE — Progress Notes (Signed)
Subjective: 1 Day Post-Op Procedure(s) (LRB): TOTAL KNEE REVISION (Left) Patient reports pain as mild.   Patient seen in rounds with Dr. Alvan Dame. Patient is well, and has had no acute complaints or problems. No acute events overnight. Foley catheter removed. Patient was in CPM yesterday for several hours.  We will start therapy today.   Objective: Vital signs in last 24 hours: Temp:  [97.4 F (36.3 C)-98.2 F (36.8 C)] 98.1 F (36.7 C) (11/09 0519) Pulse Rate:  [50-59] 53 (11/09 0519) Resp:  [15-20] 16 (11/09 0519) BP: (102-148)/(73-100) 115/78 (11/09 0519) SpO2:  [92 %-98 %] 94 % (11/09 0519) Weight:  [95.3 kg] 95.3 kg (11/08 1043)  Intake/Output from previous day:  Intake/Output Summary (Last 24 hours) at 04/29/2021 0739 Last data filed at 04/29/2021 0418 Gross per 24 hour  Intake 2490.2 ml  Output 1900 ml  Net 590.2 ml     Intake/Output this shift: No intake/output data recorded.  Labs: Recent Labs    04/29/21 0329  HGB 12.8*   Recent Labs    04/29/21 0329  WBC 11.0*  RBC 4.05*  HCT 37.2*  PLT 161   Recent Labs    04/29/21 0329  NA 138  K 4.1  CL 104  CO2 25  BUN 23*  CREATININE 0.92  GLUCOSE 173*  CALCIUM 8.4*   No results for input(s): LABPT, INR in the last 72 hours.  Exam: General - Patient is Alert and Oriented Extremity - Neurologically intact Sensation intact distally Intact pulses distally Dorsiflexion/Plantar flexion intact Dressing - dressing C/D/I Motor Function - intact, moving foot and toes well on exam.   Past Medical History:  Diagnosis Date   Arthritis    Back pain    radiates into the left posterior thigh to the calf.   GERD (gastroesophageal reflux disease)    History of kidney stones    Left shoulder pain    Leg pain    Worse than back pain   Lumbar foraminal stenosis    No pertinent past medical history    Radiculopathy, lumbar region     Assessment/Plan: 1 Day Post-Op Procedure(s) (LRB): TOTAL KNEE REVISION  (Left) Active Problems:   S/P revision of total knee, left  Estimated body mass index is 28.88 kg/m as calculated from the following:   Height as of this encounter: 5' 11.5" (1.816 m).   Weight as of this encounter: 95.3 kg. Advance diet Up with therapy  Anticipated LOS equal to or greater than 2 midnights due to - Age 69 and older with one or more of the following:  - Obesity  - Expected need for hospital services (PT, OT, Nursing) required for safe  discharge  - Anticipated need for postoperative skilled nursing care or inpatient rehab  - Active co-morbidities: None OR   - Unanticipated findings during/Post Surgery: None  - Patient is a high risk of re-admission due to: None   DVT Prophylaxis - Aspirin PWB LLE 50% (changed today)  Plan is to go Home after hospital stay.   Patient to be evaluated and treated by interventional radiology today. We appreciate their assistance.  Plan to get up with PT today. If meeting goals with therapy after IR consult, patient may discharge home today. He is scheduled for OPPT on 11/14.   We do need to order CPM for home.   Will place on Doxy 100 BID x7 days for prophylaxis due to revision surgery.   Griffith Citron, PA-C Orthopedic Surgery [phone] 04/29/2021,  7:39 AM

## 2021-04-29 NOTE — Op Note (Signed)
NAME: Harry Cobb, Harry Cobb MEDICAL RECORD NO: [id number] ACCOUNT NO: [id number] DATE OF BIRTH: 04/19/1961 FACILITY: [facility] PHYSICIAN: Pietro Cassis. Alvan Dame, MD  Operative Report   DATE OF PROCEDURE: 04/28/2021   PREOPERATIVE DIAGNOSIS:  Failed left total knee replacement due to persistent arthrofibrosis and pain.  POSTOPERATIVE DIAGNOSIS:  Failed left total knee replacement due to persistent arthrofibrosis and pain.  PROCEDURE:  Revision left total knee arthroplasty.  COMPONENTS USED:  DePuy Attune knee revision system with a size 5 Attune revision tibial baseplate with a 37 mm porous coated sleeve with press-fit 16 x 60 mm stem on the tibia side.  The femoral side was a size 6 left revision femoral component with 8  mm distal medial and lateral augments.  On the femoral side, we additionally used a 14 x 80 mm cemented stem.  An 8 mm insert was used.  This was a posterior stabilized AOX insert for the Attune knee to match the size 6 femur.  SURGEON:  Pietro Cassis. Alvan Dame, MD.  ASSISTANT:  Costella Hatcher, PA-C.  Note that Ms. Lu Duffel was present for the entirety of the case and preoperative positioning, perioperative management of the operative extremity, general facilitation of the case and primary wound closure.  ANESTHESIA:  Regional plus general.  SPECIMENS:  None.  COMPLICATIONS:  None apparent.  TOURNIQUET TIME:  Up for 96 minutes at 225 mmHg.  DRAINS:  None.  INDICATIONS:  The patient is a pleasant, healthy 60 year old active male.  He has had a history of bilateral total knee replacements as well as a left total hip replacement.  He has been challenged with stiffness as he notes presurgically, but also  post-surgically.  He has been specifically challenged with his left knee with multiple attempts including a prior excisional debridement and polyethylene revision as well as a significant amount of attempts with physical therapy and modalities to try to  improve motion.   Despite that, his motion has been limited to a slight flexion contracture to flexion around 80 degrees.  This limits his functional activities of daily living including rising from a chair.  Additionally, there are issues with his  desired activity level, which he is challenged despite high level of competitiveness.  We have been having lengthy discussions regarding the management of his left knee.  Despite the discussion of the extent that would be involved in revision in the  setting of arthrofibrosis and persistent pain and stiffness.  He wishes to proceed based on the above noted complaints about his quality of life.  I discussed with him the extensive nature of surgery required in an effort to try to maximize motion in the  setting of revision for this diagnosis.  We reviewed the literature and some of the conflicting results, but with recent data indicating perhaps some improvement in arc of motion.  We reviewed standard risk of infection, DVT, persistent stiffness, need  for future surgeries.  We discussed and reviewed the postoperative course.  We discussed and reviewed multiple other modalities and attempt to try to improve his postop motion including consideration of radiation therapy, the use of a continuous passive  motion machine, medications as well as extensive physical therapy.  It is obvious that he will work very hard in terms of trying to maximize his functional return.  Consent was obtained for the benefit of improved pain and motion.  DESCRIPTION OF PROCEDURE:  The patient was brought to the operative theater.  Once adequate anesthesia, preoperative antibiotics, 2 g of  Ancef as well as tranexamic acid and Decadron.  He was positioned supine with a left thigh tourniquet placed.  The  left lower extremity was then prepped and draped in sterile fashion.  A time-out was performed identifying the patient, planned procedure and extremity.  His old incision was incised and extended slightly  proximal and distally.  Soft tissue exposure was  obtained relatively without issue with good soft tissue planes.  A median arthrotomy was then made encountering clear yellow synovial fluid without signs of infection.  At first, the first part of the procedure for me was an extensive medial, lateral and  suprapatellar synovectomy and scar debridement.  This included around the patella as best that I could do initially and was revisited as we went through the procedure.  Once I had the knee exposed and was able to subluxate the patella laterally, we used  a thin ACL saw to undermine the tibial tray.  I was able to subluxate the tibia successfully and subsequently was able to remove the tibia without difficulty.  I then at this point removed remaining cement on the proximal tibia and then made a cut on  the tibial bone about 1 mm lower than that where the cement was as a fresh cut.  This was despite the acknowledgement that I may very well be press fitting a sleeve and that would keep [place] elevated above his proximal tibia, but nonetheless for removal of  old cement and nonviable tissue.  By doing this procedure, I was able to circumferentially debride around the tibia medially, laterally as well as in the anterior and posterior dimensions.  Once this was done, I did ream up to 16 mm with the intent of  placing a 60 mm press-fit stem.  I then used a 17 mm reamer and partially reamed.  Once this was completed, I attended to the femur.  The femur was exposed and the thin ACL saw was used to undermine the bone cement interface again on the medial and  lateral aspects of the knee as much as could be done.  We then used osteotomes in the medial and lateral aspect of the knee and we were able to remove the femoral component without significant bone loss.  All remaining cement was removed as well.  I then  evaluated the distal femoral cut and based on the fact there was no significant bone loss, I only removed about a  millimeter of bone on the distal medial and lateral aspect of the femur to provide a new cut surface at 5 degrees of valgus.  Once this was  done, I sized the distal femur to be a size 6.  Per protocol, we revisited the anterior, posterior and chamfer cuts with a guides provided for the size 6 femur.  I reamed the femur up to 16 mm with the intent of using a 14 mm x 80 mm stem.  We did a  trial reduction initially with a 6 femur with a stem and the tibial tray in place.  Here, I found that the knee came to hyperextension, but was relatively stable in flexion.  For that reason, I placed distal augments first with 4 mm and then 8 mm distal  augments, at which point I felt that I was able to balance the knee.  With a trial component was then finished up further debridement around the patella of scar tissue and then removed the old patellar button measuring the patella height with a button  at  27 mm.  I replaced the patella with a size 38 patellar button.  Once this was completed, all the trial components were removed from the knee.  We irrigated the canals with pulse lavage and a canal brush irrigator.  I measured and selected a size 5  cement restrictor for the distal femur.  We then irrigated the knee with Betadine solution.  Final components were opened on the back table and configured under direct supervision as noted above.  The final tibial component was then press fit into place  with significant levels of impaction without further migration.  Cement was mixed for the femoral side.  The final component was then cemented on the femoral side and the knee was brought to extension with an 8 mm insert.  The patella was cemented into  place as well.  The cement was allowed to cure with the knee in extension and excessive cement was removed.  Once the cement had fully cured, I reassessed and determined the size 8 insert would be best.  A size 8 posterior stabilized insert to match the  6 femur was opened and  placed in the tibial tray and the knee was reduced.  We had let the tourniquet down after 96 minutes.  No significant hemostasis was required.  At this point, I did spend about 5 minutes stretching his knee, then by doing so, I  used towel clips to hold his extensor mechanism over top of the knee and flexed his knee.  We did find that we were able to get his knee back to about 120 degrees.  With the knee in a flexed position, I reapproximated the extensor mechanism using #1  Vicryl sutures to provide a substantial scaffold to the reconstruction followed by the use of #1 Stratafix.  With the capsule closed, we were able to get a good stretch on his knee as noted with regards to his motion.  The remainder of the wound was  closed with 2-0 Vicryl and a running Monocryl stitch.  The knee was clean, dry and dressed sterilely at the end of the case with Dermabond and Aquacel dressing.  He was then brought to the recovery room in stable condition, tolerating the procedure well.  Postoperatively, we will use a CPM machine in the hospital with transition to home.  We did discuss and I ordered a consultation for radiation therapy as a single dose treatment for some hopeful management of what appears to be this hypertrophic scarring  that he has dealt with in all of his joints.  I also will put him on a medication named Trental, which he has used in times of postradiation scarring to try to improve vascularity and allow for better healing.  He will then be seen and discharged from  the hospital with plans for outpatient physical therapy.  Findings were reviewed with his wife.     SUJ D: 04/28/2021 4:21:05 pm T: 04/29/2021 12:19:00 am  JOB: [id number]/ [id number]

## 2021-04-29 NOTE — TOC Transition Note (Addendum)
Transition of Care (TOC) - CM/SW Discharge Note  Patient Details  Name: Jacquel E Modesitt MRN: [id number] Date of Birth: 02/05/1961  Transition of Care (TOC) CM/SW Contact:  Megan S Glenn, LCSW Phone Number: 04/29/2021, 11:11 AM  Clinical Narrative: Patient is expected to discharge home after working with PT. CSW met with patient to confirm discharge plan. Patient will discharge home with OPPT at [facility]. Patient reported he has used crutches after previous surgeries, but is unsure if he needs a walker now. CSW confirmed with ortho PA that patient will need a walker. Patient is agreeable to referral to [facility].  CSW made DME referral to Genevieve with [employer]. [facility] to deliver walker to patient's room. TOC signing off.  Addendum: CSW notified patient will need a CPM. Nathan with [employer] to set up CPM tomorrow. CSW updated patient and RN.  Final next level of care: OP Rehab Barriers to Discharge: No Barriers Identified  Patient Goals and CMS Choice Patient states their goals for this hospitalization and ongoing recovery are:: Discharge home with OPPT at [facility] Choice offered to / list presented to : Patient  Discharge Plan and Services         DME Arranged: Walker rolling DME Agency: [facility] Date DME Agency Contacted: 04/29/21 Time DME Agency Contacted: 1102 Representative spoke with at DME Agency: Genevieve  Readmission Risk Interventions No flowsheet data found.  

## 2021-04-29 NOTE — Evaluation (Signed)
Physical Therapy Evaluation Patient Details Name: Harry Cobb MRN: [id number] DOB: Sep 09, 1960 Today's Date: 04/29/2021  History of Present Illness  60 y.o. male admitted 04/28/21 for L TKA revision 2* arthofibrosis. PMH includes lumbar fusion, DDD, L TKA 2014 with revision 2018, L THA 2017.  Clinical Impression  Pt is mobilizing well, he ambulated 200' with RW without loss of balance. Pt has significant edema in L knee, appears to be joint effusion, unable to palpate patella due to excessive fluid, RN notified and will notify MD. Reviewed HEP. He is ready to DC home from a PT standpoint.         Recommendations for follow up therapy are one component of a multi-disciplinary discharge planning process, led by the attending physician.  Recommendations may be updated based on patient status, additional functional criteria and insurance authorization.  Follow Up Recommendations Follow physician's recommendations for discharge plan and follow up therapies    Assistance Recommended at Discharge Set up Supervision/Assistance  Functional Status Assessment Patient has had a recent decline in their functional status and demonstrates the ability to make significant improvements in function in a reasonable and predictable amount of time.  Equipment Recommendations  Rolling walker (2 wheels)    Recommendations for Other Services       Precautions / Restrictions Precautions Precautions: Knee Precaution Booklet Issued: Yes (comment) Precaution Comments: reviewed no pillow under knee Restrictions Weight Bearing Restrictions: No LLE Weight Bearing: Weight bearing as tolerated      Mobility  Bed Mobility Overal bed mobility: Independent                  Transfers Overall transfer level: Independent                      Ambulation/Gait Ambulation/Gait assistance: Modified independent (Device/Increase time) Gait Distance (Feet): 200 Feet Assistive device: Rolling walker (2  wheels) Gait Pattern/deviations: Step-through pattern;Decreased step length - right;Decreased step length - left Gait velocity: WFL     General Gait Details: steady with RW, no loss of balance  Stairs            Wheelchair Mobility    Modified Rankin (Stroke Patients Only)       Balance Overall balance assessment: Modified Independent                                           Pertinent Vitals/Pain Pain Assessment: 0-10 Pain Score: 3  Pain Location: L knee Pain Descriptors / Indicators: Aching Pain Intervention(s): Limited activity within patient's tolerance;Monitored during session;Premedicated before session;Repositioned;Ice applied    Home Living Family/patient expects to be discharged to:: Private residence Living Arrangements: Spouse/significant other Available Help at Discharge: Family Type of Home: House Home Access: Stairs to enter   [employer] of Steps: 2 front; 6 back (has rail) Alternate Level Stairs-Number of Steps: Flight Home Layout: Two level;Bed/bath upstairs Home Equipment: Shower seat - built in      Prior Function Prior Level of Function : Independent/Modified Independent             Mobility Comments: was playing pickle ball and golf PTA       Hand Dominance   Dominant Hand: Right    Extremity/Trunk Assessment   Upper Extremity Assessment Upper Extremity Assessment: Overall WFL for tasks assessed    Lower Extremity Assessment Lower Extremity Assessment: LLE deficits/detail  LLE Deficits / Details: knee AAROM 0-45*, significant swelling noted in L knee, appears to be joint effusion, unable to palpate patella 2* fluid, RN notified and she will notify MD, SLR 3/5 at least LLE Sensation: WNL    Cervical / Trunk Assessment Cervical / Trunk Assessment: Normal  Communication   Communication: No difficulties  Cognition Arousal/Alertness: Awake/alert Behavior During Therapy: WFL for tasks  assessed/performed Overall Cognitive Status: Within Functional Limits for tasks assessed                                          General Comments      Exercises Total Joint Exercises Ankle Circles/Pumps: AROM;Both;10 reps Quad Sets: AROM;Left;5 reps;Supine Heel Slides: AAROM;Left;5 reps;Supine Long Arc Quad: AROM;Left;10 reps;Seated Knee Flexion: AAROM;Left;10 reps;Seated Goniometric ROM: 0-45* AAROM L knee   Assessment/Plan    PT Assessment All further PT needs can be met in the next venue of care  PT Problem List Decreased range of motion;Pain       PT Treatment Interventions      PT Goals (Current goals can be found in the Care Plan section)  Acute Rehab PT Goals Patient Stated Goal: return to pickleball, golf PT Goal Formulation: All assessment and education complete, DC therapy    Frequency     Barriers to discharge        Co-evaluation               AM-PAC PT "6 Clicks" Mobility  Outcome Measure Help needed turning from your back to your side while in a flat bed without using bedrails?: None Help needed moving from lying on your back to sitting on the side of a flat bed without using bedrails?: None Help needed moving to and from a bed to a chair (including a wheelchair)?: None Help needed standing up from a chair using your arms (e.g., wheelchair or bedside chair)?: None Help needed to walk in hospital room?: None Help needed climbing 3-5 steps with a railing? : None 6 Click Score: 24    End of Session   Activity Tolerance: Patient tolerated treatment well Patient left: in chair;with call bell/phone within reach Nurse Communication: Mobility status;Other (comment) (pronounced swelling L knee)      Time: 1040-1102 PT Time Calculation (min) (ACUTE ONLY): 22 min   Charges:   PT Evaluation $PT Eval Low Complexity: 1 Low         Blondell Reveal Kistler PT 04/29/2021  Acute Rehabilitation Services Pager  [phone] Office [phone]

## 2021-04-29 NOTE — Progress Notes (Signed)
Provided discharge education/instructions, all questions and concerns addressed, Pt not in acute distress, discharged home with RW and belongings accompanied by family.

## 2021-04-30 DIAGNOSIS — Z96652 Presence of left artificial knee joint: Secondary | ICD-10-CM | POA: Diagnosis not present

## 2021-04-30 DIAGNOSIS — T8189XA Other complications of procedures, not elsewhere classified, initial encounter: Secondary | ICD-10-CM | POA: Diagnosis not present

## 2021-04-30 DIAGNOSIS — Z471 Aftercare following joint replacement surgery: Secondary | ICD-10-CM | POA: Diagnosis not present

## 2021-05-01 DIAGNOSIS — M1712 Unilateral primary osteoarthritis, left knee: Secondary | ICD-10-CM | POA: Diagnosis not present

## 2021-05-01 DIAGNOSIS — Z96652 Presence of left artificial knee joint: Secondary | ICD-10-CM | POA: Diagnosis not present

## 2021-05-04 DIAGNOSIS — M25662 Stiffness of left knee, not elsewhere classified: Secondary | ICD-10-CM | POA: Diagnosis not present

## 2021-05-06 DIAGNOSIS — M25662 Stiffness of left knee, not elsewhere classified: Secondary | ICD-10-CM | POA: Diagnosis not present

## 2021-05-08 DIAGNOSIS — M25662 Stiffness of left knee, not elsewhere classified: Secondary | ICD-10-CM | POA: Diagnosis not present

## 2021-05-08 NOTE — Discharge Summary (Signed)
Physician Discharge Summary   Patient ID: Harry Cobb MRN: [id number] DOB/AGE: [birth year] 60 y.o.  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  Admission Diagnoses:  Past Medical History:  Diagnosis Date   Arthritis    Back pain    radiates into the left posterior thigh to the calf.   GERD (gastroesophageal reflux disease)    History of kidney stones    Left shoulder pain    Leg pain    Worse than back pain   Lumbar foraminal stenosis    No pertinent past medical history    Radiculopathy, lumbar region    Discharge Diagnoses:   Active Problems:   S/P revision of total knee, left  Estimated body mass index is 28.88 kg/m as calculated from the following:   Height as of this encounter: 5' 11.5" (1.816 m).   Weight as of this encounter: 95.3 kg.  Procedure:  Procedure(s) (LRB): TOTAL KNEE REVISION (Left)   Consults: None  HPI: The patient is a pleasant, healthy 60 year old active male.  He has had a history of bilateral total knee replacements as well as a left total hip replacement.  He has been challenged with stiffness as he notes presurgically, but also  post-surgically.  He has been specifically challenged with his left knee with multiple attempts including a prior excisional debridement and polyethylene revision as well as a significant amount of attempts with physical therapy and modalities to try to  improve motion.  Despite that, his motion has been limited to a slight flexion contracture to flexion around 80 degrees.  This limits his functional activities of daily living including rising from a chair.  Additionally, there are issues with his  desired activity level, which he is challenged despite high level of competitiveness.  We have been having lengthy discussions regarding the management of his left knee.  Despite the discussion of the extent that would be  involved in revision in the  setting of arthrofibrosis and persistent pain and stiffness.  He wishes to proceed based on the above noted complaints about his quality of life.  I discussed with him the extensive nature of surgery required in an effort to try to maximize motion in the  setting of revision for this diagnosis.  We reviewed the literature and some of the conflicting results, but with recent data indicating perhaps some improvement in arc of motion.  We reviewed standard risk of infection, DVT, persistent stiffness, need  for future surgeries.  We discussed and reviewed the postoperative course.  We discussed and reviewed multiple other modalities and attempt to try to improve his postop motion including consideration of radiation therapy, the use of a continuous passive  motion machine, medications as well as extensive physical therapy.  It is obvious that he will work very hard in terms of trying to maximize his functional return.  Consent was obtained for the benefit of improved pain and motion.  Laboratory Data: Admission on 04/28/2021, Discharged on 04/29/2021  Component Date Value Ref Range Status   SARS Coronavirus 2 04/28/2021 NEGATIVE  NEGATIVE Final   Comment: (NOTE) SARS-CoV-2 target nucleic acids are NOT DETECTED.  The SARS-CoV-2 RNA is generally detectable in upper and lower respiratory specimens during the acute phase of infection. The lowest concentration of SARS-CoV-2 viral copies this assay can detect is 250 copies / mL. A negative result does not preclude SARS-CoV-2 infection and should not be used  as the sole basis for treatment or other patient management decisions.  A negative result may occur with improper specimen collection / handling, submission of specimen other than nasopharyngeal swab, presence of viral mutation(s) within the areas targeted by this assay, and inadequate number of viral copies (<250 copies / mL). A negative result must be combined with  clinical observations, patient history, and epidemiological information.  Fact Sheet for Patients:   StrictlyIdeas.no  Fact Sheet for Healthcare Providers: BankingDealers.co.za  This test is not yet approved or                           cleared by the [place] FDA and has been authorized for detection and/or diagnosis of SARS-CoV-2 by FDA under an Emergency Use Authorization (EUA).  This EUA will remain in effect (meaning this test can be used) for the duration of the COVID-19 declaration under Section 564(b)(1) of the Act, 21 U.S.C. section 360bbb-3(b)(1), unless the authorization is terminated or revoked sooner.  Performed at [facility], [place] [address], [place], [place] [address]    WBC 04/29/2021 11.0 (H)  4.0 - 10.5 K/uL Final   RBC 04/29/2021 4.05 (L)  4.22 - 5.81 MIL/uL Final   Hemoglobin 04/29/2021 12.8 (L)  13.0 - 17.0 g/dL Final   HCT 04/29/2021 37.2 (L)  39.0 - 52.0 % Final   MCV 04/29/2021 91.9  80.0 - 100.0 fL Final   MCH 04/29/2021 31.6  26.0 - 34.0 pg Final   MCHC 04/29/2021 34.4  30.0 - 36.0 g/dL Final   RDW 04/29/2021 12.8  11.5 - 15.5 % Final   Platelets 04/29/2021 161  150 - 400 K/uL Final   nRBC 04/29/2021 0.0  0.0 - 0.2 % Final   Performed at [facility], [place] [address], [place], [place] [address]   Sodium 04/29/2021 138  135 - 145 mmol/L Final   Potassium 04/29/2021 4.1  3.5 - 5.1 mmol/L Final   Chloride 04/29/2021 104  98 - 111 mmol/L Final   CO2 04/29/2021 25  22 - 32 mmol/L Final   Glucose, Bld 04/29/2021 173 (H)  70 - 99 mg/dL Final   Glucose reference range applies only to samples taken after fasting for at least 8 hours.   BUN 04/29/2021 23 (H)  6 - 20 mg/dL Final   Creatinine, Ser 04/29/2021 0.92  0.61 - 1.24 mg/dL Final   Calcium 04/29/2021 8.4 (L)  8.9 - 10.3 mg/dL Final   GFR, Estimated 04/29/2021 >60  >60 mL/min Final   Comment: (NOTE) Calculated using  the CKD-EPI Creatinine Equation (2021)    Anion gap 04/29/2021 9  5 - 15 Final   Performed at [facility], [place] [address], [place], [place] [address]  Hospital Outpatient Visit on 04/16/2021  Component Date Value Ref Range Status   MRSA, PCR 04/16/2021 NEGATIVE  NEGATIVE Final   Staphylococcus aureus 04/16/2021 POSITIVE (A)  NEGATIVE Final   Comment: (NOTE) The Xpert SA Assay (FDA approved for NASAL specimens in patients 22 years of age and older), is one component of a comprehensive surveillance program. It is not intended to diagnose infection nor to guide or monitor treatment. Performed at [facility], [place] [address] [place], [place] [address]    WBC 04/16/2021 5.1  4.0 - 10.5 K/uL Final   RBC 04/16/2021 4.86  4.22 - 5.81 MIL/uL Final   Hemoglobin 04/16/2021 15.2  13.0 - 17.0 g/dL  Final   HCT 04/16/2021 44.6  39.0 - 52.0 % Final   MCV 04/16/2021 91.8  80.0 - 100.0 fL Final   MCH 04/16/2021 31.3  26.0 - 34.0 pg Final   MCHC 04/16/2021 34.1  30.0 - 36.0 g/dL Final   RDW 04/16/2021 12.8  11.5 - 15.5 % Final   Platelets 04/16/2021 150  150 - 400 K/uL Final   nRBC 04/16/2021 0.0  0.0 - 0.2 % Final   Performed at [facility], [place] [address], [place], [place] [address]   Sodium 04/16/2021 136  135 - 145 mmol/L Final   Potassium 04/16/2021 3.8  3.5 - 5.1 mmol/L Final   Chloride 04/16/2021 108  98 - 111 mmol/L Final   CO2 04/16/2021 23  22 - 32 mmol/L Final   Glucose, Bld 04/16/2021 90  70 - 99 mg/dL Final   Glucose reference range applies only to samples taken after fasting for at least 8 hours.   BUN 04/16/2021 23 (H)  6 - 20 mg/dL Final   Creatinine, Ser 04/16/2021 1.04  0.61 - 1.24 mg/dL Final   Calcium 04/16/2021 8.8 (L)  8.9 - 10.3 mg/dL Final   Total Protein 04/16/2021 7.2  6.5 - 8.1 g/dL Final   Albumin 04/16/2021 4.1  3.5 - 5.0 g/dL Final   AST 04/16/2021 23  15 - 41 U/L Final   ALT 04/16/2021 21  0 - 44 U/L Final    Alkaline Phosphatase 04/16/2021 60  38 - 126 U/L Final   Total Bilirubin 04/16/2021 2.1 (H)  0.3 - 1.2 mg/dL Final   GFR, Estimated 04/16/2021 >60  >60 mL/min Final   Comment: (NOTE) Calculated using the CKD-EPI Creatinine Equation (2021)    Anion gap 04/16/2021 5  5 - 15 Final   Performed at [facility], [place] [address] [place], [place] [address]   ABO/RH(D) 04/16/2021 O NEG   Final   Antibody Screen 04/16/2021 NEG   Final   Sample Expiration 04/16/2021 04/30/2021,2359   Final   Extend sample reason 04/16/2021    Final                   Value:NO TRANSFUSIONS OR PREGNANCY IN THE PAST 3 MONTHS Performed at [place] [address], [place], [place] [address]      X-Rays:No results found.  EKG: Orders placed or performed during the hospital encounter of 06/19/20   EKG test   EKG test     Hospital Course: RAYLEE ADAMEC is a 60 y.o. who was admitted to [facility]. They were brought to the operating room on 04/28/2021 and underwent Procedure(s): [place].  Patient tolerated the procedure well and was later transferred to the recovery room and then to the orthopaedic floor for postoperative care. They were given PO and IV analgesics for pain control following their surgery. They were given 24 hours of postoperative antibiotics of  Anti-infectives (From admission, onward)    Start     Dose/Rate Route Frequency Ordered Stop   04/29/21 0000  doxycycline (VIBRAMYCIN) 50 MG capsule        100 mg Oral 2 times daily 04/29/21 1652 05/06/21 2359   04/28/21 1900  ceFAZolin (ANCEF) IVPB 2g/100 mL premix        2 g 200 mL/hr over 30 Minutes Intravenous Every 6 hours 04/28/21 1713 04/29/21 0113   04/28/21 1015  ceFAZolin (ANCEF) IVPB 2g/100 mL premix  2 g 200 mL/hr over 30 Minutes Intravenous On call to O.R. 04/28/21 1013 04/28/21 1320      and started on DVT prophylaxis in the form of Aspirin.   PT and OT were ordered for  total joint protocol. Discharge planning consulted to help with postop disposition and equipment needs.  Patient had a good night on the evening of surgery. They started to get up OOB with therapy on POD #1. Pt was seen during rounds and was ready to go home pending progress with therapy.he worked with therapy on POD #1 and was meeting his goals. Pt was discharged to home later that day in stable condition.  Diet: Regular diet Activity: WBAT Follow-up: in 2 weeks Disposition: Home Discharged Condition: good   Discharge Instructions     Call MD / Call 911   Complete by: As directed    If you experience chest pain or shortness of breath, CALL 911 and be transported to the hospital emergency room.  If you develope a fever above 101 F, pus (white drainage) or increased drainage or redness at the wound, or calf pain, call your surgeon's office.   Change dressing   Complete by: As directed    Maintain surgical dressing until follow up in the clinic. If the edges start to pull up, may reinforce with tape. If the dressing is no longer working, may remove and cover with gauze and tape, but must keep the area dry and clean.  Call with any questions or concerns.   Constipation Prevention   Complete by: As directed    Drink plenty of fluids.  Prune juice may be helpful.  You may use a stool softener, such as Colace (over the counter) 100 mg twice a day.  Use MiraLax (over the counter) for constipation as needed.   Diet - low sodium heart healthy   Complete by: As directed    Increase activity slowly as tolerated   Complete by: As directed    Partial weight bearing with assist device as directed.   Post-operative opioid taper instructions:   Complete by: As directed    POST-OPERATIVE OPIOID TAPER INSTRUCTIONS: It is important to wean off of your opioid medication as soon as possible. If you do not need pain medication after your surgery it is ok to stop day one. Opioids include: Codeine,  Hydrocodone(Norco, Vicodin), Oxycodone(Percocet, oxycontin) and hydromorphone amongst others.  Long term and even short term use of opiods can cause: Increased pain response Dependence Constipation Depression Respiratory depression And more.  Withdrawal symptoms can include Flu like symptoms Nausea, vomiting And more Techniques to manage these symptoms Hydrate well Eat regular healthy meals Stay active Use relaxation techniques(deep breathing, meditating, yoga) Do Not substitute Alcohol to help with tapering If you have been on opioids for less than two weeks and do not have pain than it is ok to stop all together.  Plan to wean off of opioids This plan should start within one week post op of your joint replacement. Maintain the same interval or time between taking each dose and first decrease the dose.  Cut the total daily intake of opioids by one tablet each day Next start to increase the time between doses. The last dose that should be eliminated is the evening dose.      TED hose   Complete by: As directed    Use stockings (TED hose) for 2 weeks on both leg(s).  You may remove them at night for sleeping.  Allergies as of 04/29/2021   No Known Allergies      Medication List     STOP taking these medications    diclofenac 75 MG EC tablet Commonly known as: VOLTAREN       TAKE these medications    acetaminophen 325 MG tablet Commonly known as: TYLENOL Take 1-2 tablets (325-650 mg total) by mouth every 6 (six) hours as needed for mild pain (pain score 1-3 or temp > 100.5). Notes to patient: Last dose given 11/08 10:32am   aspirin 81 MG chewable tablet Chew 1 tablet (81 mg total) by mouth 2 (two) times daily for 28 days.   celecoxib 200 MG capsule Commonly known as: CELEBREX Take 1 capsule (200 mg total) by mouth 2 (two) times daily.   docusate sodium 100 MG capsule Commonly known as: COLACE Take 1 capsule (100 mg total) by mouth 2 (two) times  daily.   fexofenadine 180 MG tablet Commonly known as: ALLEGRA Take 180 mg by mouth daily. Notes to patient: Resume home regimen   methocarbamol 500 MG tablet Commonly known as: ROBAXIN Take 1 tablet (500 mg total) by mouth every 6 (six) hours as needed for muscle spasms. Notes to patient: Last dose given 11/09 04:16am   oxyCODONE 5 MG immediate release tablet Commonly known as: Oxy IR/ROXICODONE Take 1-2 tablets (5-10 mg total) by mouth every 4 (four) hours as needed for severe pain (pain score 4-6). Notes to patient: Last dose given 11/09 03:54pm   pentoxifylline 400 MG CR tablet Commonly known as: TRENTAL Take 1 tablet (400 mg total) by mouth 2 (two) times daily.   polyethylene glycol 17 g packet Commonly known as: MIRALAX / GLYCOLAX Take 17 g by mouth daily as needed for mild constipation.   traZODone 50 MG tablet Commonly known as: DESYREL Take 1 tablet (50 mg total) by mouth at bedtime. Take 1 tablet at bedtime as needed for sleep       ASK your doctor about these medications    doxycycline 50 MG capsule Commonly known as: VIBRAMYCIN Take 2 capsules (100 mg total) by mouth 2 (two) times daily for 7 days. Ask about: Should I take this medication?               Discharge Care Instructions  (From admission, onward)           Start     Ordered   04/29/21 0000  Change dressing       Comments: Maintain surgical dressing until follow up in the clinic. If the edges start to pull up, may reinforce with tape. If the dressing is no longer working, may remove and cover with gauze and tape, but must keep the area dry and clean.  Call with any questions or concerns.   04/29/21 1652            Follow-up Information     Paralee Cancel, MD. Schedule an appointment as soon as possible for a visit in 2 week(s).   Specialty: Orthopedic Surgery Contact information: [address] [place] [address] [phone]                  Signed: Griffith Citron, PA-C Orthopedic Surgery 05/08/2021, 8:08 AM

## 2021-05-11 DIAGNOSIS — M25662 Stiffness of left knee, not elsewhere classified: Secondary | ICD-10-CM | POA: Diagnosis not present

## 2021-05-13 DIAGNOSIS — Z96652 Presence of left artificial knee joint: Secondary | ICD-10-CM | POA: Diagnosis not present

## 2021-05-13 DIAGNOSIS — Z471 Aftercare following joint replacement surgery: Secondary | ICD-10-CM | POA: Diagnosis not present

## 2021-05-13 DIAGNOSIS — M25662 Stiffness of left knee, not elsewhere classified: Secondary | ICD-10-CM | POA: Diagnosis not present

## 2021-05-18 DIAGNOSIS — M25662 Stiffness of left knee, not elsewhere classified: Secondary | ICD-10-CM | POA: Diagnosis not present

## 2021-05-20 DIAGNOSIS — M25662 Stiffness of left knee, not elsewhere classified: Secondary | ICD-10-CM | POA: Diagnosis not present

## 2021-05-20 DIAGNOSIS — Z471 Aftercare following joint replacement surgery: Secondary | ICD-10-CM | POA: Diagnosis not present

## 2021-05-20 DIAGNOSIS — Z96652 Presence of left artificial knee joint: Secondary | ICD-10-CM | POA: Diagnosis not present

## 2021-05-22 DIAGNOSIS — M25662 Stiffness of left knee, not elsewhere classified: Secondary | ICD-10-CM | POA: Diagnosis not present

## 2021-05-25 DIAGNOSIS — M25662 Stiffness of left knee, not elsewhere classified: Secondary | ICD-10-CM | POA: Diagnosis not present

## 2021-05-27 DIAGNOSIS — Z471 Aftercare following joint replacement surgery: Secondary | ICD-10-CM | POA: Diagnosis not present

## 2021-05-27 DIAGNOSIS — Z96642 Presence of left artificial hip joint: Secondary | ICD-10-CM | POA: Diagnosis not present

## 2021-05-28 DIAGNOSIS — K269 Duodenal ulcer, unspecified as acute or chronic, without hemorrhage or perforation: Secondary | ICD-10-CM | POA: Diagnosis not present

## 2021-05-28 DIAGNOSIS — K449 Diaphragmatic hernia without obstruction or gangrene: Secondary | ICD-10-CM | POA: Diagnosis not present

## 2021-05-28 DIAGNOSIS — K2930 Chronic superficial gastritis without bleeding: Secondary | ICD-10-CM | POA: Diagnosis not present

## 2021-05-28 DIAGNOSIS — R195 Other fecal abnormalities: Secondary | ICD-10-CM | POA: Diagnosis not present

## 2021-05-28 DIAGNOSIS — K922 Gastrointestinal hemorrhage, unspecified: Secondary | ICD-10-CM | POA: Diagnosis not present

## 2021-05-29 DIAGNOSIS — Z4789 Encounter for other orthopedic aftercare: Secondary | ICD-10-CM | POA: Diagnosis not present

## 2021-05-29 DIAGNOSIS — M25662 Stiffness of left knee, not elsewhere classified: Secondary | ICD-10-CM | POA: Diagnosis not present

## 2021-06-01 DIAGNOSIS — M25662 Stiffness of left knee, not elsewhere classified: Secondary | ICD-10-CM | POA: Diagnosis not present

## 2021-06-03 DIAGNOSIS — E7800 Pure hypercholesterolemia, unspecified: Secondary | ICD-10-CM | POA: Diagnosis not present

## 2021-06-03 DIAGNOSIS — T8484XA Pain due to internal orthopedic prosthetic devices, implants and grafts, initial encounter: Secondary | ICD-10-CM | POA: Diagnosis not present

## 2021-06-03 DIAGNOSIS — Z471 Aftercare following joint replacement surgery: Secondary | ICD-10-CM | POA: Diagnosis not present

## 2021-06-03 DIAGNOSIS — M25662 Stiffness of left knee, not elsewhere classified: Secondary | ICD-10-CM | POA: Diagnosis not present

## 2021-06-03 DIAGNOSIS — R5383 Other fatigue: Secondary | ICD-10-CM | POA: Diagnosis not present

## 2021-06-03 DIAGNOSIS — R509 Fever, unspecified: Secondary | ICD-10-CM | POA: Diagnosis not present

## 2021-06-03 DIAGNOSIS — M1990 Unspecified osteoarthritis, unspecified site: Secondary | ICD-10-CM | POA: Diagnosis not present

## 2021-06-03 DIAGNOSIS — R52 Pain, unspecified: Secondary | ICD-10-CM | POA: Diagnosis not present

## 2021-06-03 DIAGNOSIS — Z96652 Presence of left artificial knee joint: Secondary | ICD-10-CM | POA: Diagnosis not present

## 2021-06-05 DIAGNOSIS — M25661 Stiffness of right knee, not elsewhere classified: Secondary | ICD-10-CM | POA: Diagnosis not present

## 2021-06-05 DIAGNOSIS — M25611 Stiffness of right shoulder, not elsewhere classified: Secondary | ICD-10-CM | POA: Diagnosis not present

## 2021-06-08 ENCOUNTER — Encounter (HOSPITAL_COMMUNITY): Payer: Self-pay

## 2021-06-08 ENCOUNTER — Encounter (HOSPITAL_COMMUNITY)
Admission: RE | Admit: 2021-06-08 | Discharge: 2021-06-08 | Disposition: A | Discharge status: Home / Self Care | Payer: BC Managed Care – PPO | Source: Ambulatory Visit | Attending: Orthopedic Surgery | Admitting: Orthopedic Surgery

## 2021-06-08 ENCOUNTER — Other Ambulatory Visit: Payer: Self-pay

## 2021-06-08 VITALS — BP 137/92 | HR 61 | Temp 97.9°F | Resp 18 | Ht 72.0 in | Wt 217.0 lb

## 2021-06-08 DIAGNOSIS — Z96652 Presence of left artificial knee joint: Secondary | ICD-10-CM | POA: Insufficient documentation

## 2021-06-08 DIAGNOSIS — Z01818 Encounter for other preprocedural examination: Secondary | ICD-10-CM

## 2021-06-08 DIAGNOSIS — Z01812 Encounter for preprocedural laboratory examination: Secondary | ICD-10-CM | POA: Insufficient documentation

## 2021-06-08 LAB — CBC
HCT: 38.6 % — ABNORMAL LOW (ref 39.0–52.0)
Hemoglobin: 13.3 g/dL (ref 13.0–17.0)
MCH: 31.4 pg (ref 26.0–34.0)
MCHC: 34.5 g/dL (ref 30.0–36.0)
MCV: 91 fL (ref 80.0–100.0)
Platelets: 207 10*3/uL (ref 150–400)
RBC: 4.24 MIL/uL (ref 4.22–5.81)
RDW: 12.7 % (ref 11.5–15.5)
WBC: 5 10*3/uL (ref 4.0–10.5)
nRBC: 0 % (ref 0.0–0.2)

## 2021-06-08 LAB — BASIC METABOLIC PANEL
Anion gap: 9 (ref 5–15)
BUN: 22 mg/dL — ABNORMAL HIGH (ref 6–20)
CO2: 22 mmol/L (ref 22–32)
Calcium: 8.9 mg/dL (ref 8.9–10.3)
Chloride: 107 mmol/L (ref 98–111)
Creatinine, Ser: 0.87 mg/dL (ref 0.61–1.24)
GFR, Estimated: >60 mL/min (ref >60)
Glucose, Bld: 110 mg/dL — ABNORMAL HIGH (ref 70–99)
Potassium: 3.8 mmol/L (ref 3.5–5.1)
Sodium: 138 mmol/L (ref 135–145)

## 2021-06-08 LAB — TYPE AND SCREEN
ABO/RH(D): O NEG
Antibody Screen: NEGATIVE

## 2021-06-08 LAB — SURGICAL PCR SCREEN
MRSA, PCR: NEGATIVE
Staphylococcus aureus: NEGATIVE

## 2021-06-08 NOTE — Progress Notes (Signed)
COVID test- DOS   PCP - Dr. Alfonso Patten. [place] Cardiologist - no  Chest x-ray - no EKG -12/30/221-epic Stress Test - no ECHO - no Cardiac Cath - no Pacemaker/ICD device last checked:NA  Sleep Study - no CPAP -   Fasting Blood Sugar - NA Checks Blood Sugar _____ times a day  Blood Thinner Instructions:NA Aspirin Instructions: Last Dose:  Anesthesia review: no  Patient denies shortness of breath, fever, cough and chest pain at PAT appointment  Pt has no SOB with any activities Patient verbalized understanding of instructions that were given to them at the PAT appointment. Patient was also instructed that they will need to review over the PAT instructions again at home before surgery. yes

## 2021-06-08 NOTE — Patient Instructions (Signed)
DUE TO COVID-19 ONLY ONE VISITOR IS ALLOWED TO COME WITH YOU AND STAY IN THE WAITING ROOM ONLY DURING PRE OP AND PROCEDURE DAY OF SURGERY IF YOU ARE GOING HOME AFTER SURGERY. IF YOU ARE SPENDING THE NIGHT 2 PEOPLE MAY VISIT WITH YOU IN YOUR PRIVATE ROOM AFTER SURGERY UNTIL VISITING  HOURS ARE OVER AT 8:00 PM AND 1  VISITOR  MAY  SPEND THE NIGHT.                 Harry Cobb     Your procedure is scheduled on: 06/09/21   Report to [facility] Main  Entrance   Report to admitting at  4:50 PM     Call this number if you have problems the morning of surgery [phone]    Remember: Do not eat food  :After Midnight the night before your surgery,   You may have clear liquids from midnight until 10:30 am    CLEAR LIQUID DIET   Foods Allowed                                                                     Foods Excluded  Coffee and tea, regular and decaf                             liquids that you cannot  Plain Jell-O any favor except red or purple                                           see through such as: Fruit ices (not with fruit pulp)                                     milk, soups, orange juice  Iced Popsicles                                    All solid food Carbonated beverages, regular and diet                                    Cranberry, grape and apple juices Sports drinks like Gatorade Lightly seasoned clear broth or consume(fat free) Sugar     BRUSH YOUR TEETH MORNING OF SURGERY AND RINSE YOUR MOUTH OUT, NO CHEWING GUM CANDY OR MINTS.     Take these medicines the morning of surgery with A SIP OF WATER: none                                You may not have any metal on your body including hair pins and              piercings  Do not wear jewelry,lotions, powders or  deodorant             Men may  shave face and neck.   Do not bring valuables to the hospital. [place].  Contacts, dentures or  bridgework may not be worn into surgery.  Leave suitcase in the car. After surgery it may be brought to your room.               [facility] - Preparing for Surgery Before surgery, you can play an important role.  Because skin is not sterile, your skin needs to be as free of germs as possible.  You can reduce the number of germs on your skin by washing with CHG (chlorahexidine gluconate) soap before surgery.  CHG is an antiseptic cleaner which kills germs and bonds with the skin to continue killing germs even after washing. Please DO NOT use if you have an allergy to CHG or antibacterial soaps.  If your skin becomes reddened/irritated stop using the CHG and inform your nurse when you arrive at Short Stay. You may shave your face/neck. Please follow these instructions carefully:  1.  Shower with CHG Soap the night before surgery and the  morning of Surgery.  2.  If you choose to wash your hair, wash your hair first as usual with your  normal  shampoo.  3.  After you shampoo, rinse your hair and body thoroughly to remove the  shampoo.                            4.  Use CHG as you would any other liquid soap.  You can apply chg directly  to the skin and wash                       Gently with a scrungie or clean washcloth.  5.  Apply the CHG Soap to your body ONLY FROM THE NECK DOWN.   Do not use on face/ open                           Wound or open sores. Avoid contact with eyes, ears mouth and genitals (private parts).                       Wash face,  Genitals (private parts) with your normal soap.             6.  Wash thoroughly, paying special attention to the area where your surgery  will be performed.  7.  Thoroughly rinse your body with warm water from the neck down.  8.  DO NOT shower/wash with your normal soap after using and rinsing off  the CHG Soap.                9.  Pat yourself dry with a clean towel.            10.  Wear clean pajamas.            11.  Place clean sheets on your  bed the night of your first shower and do not  sleep with pets. Day of Surgery : Do not apply any lotions/deodorants the morning of surgery.  Please wear clean clothes to the hospital/surgery center.  FAILURE TO FOLLOW THESE INSTRUCTIONS MAY RESULT IN THE CANCELLATION OF YOUR SURGERY PATIENT SIGNATURE_________________________________  NURSE SIGNATURE__________________________________  ________________________________________________________________________  Incentive Spirometer  An incentive spirometer is a tool that can help keep your lungs clear and active. This tool measures how well you are filling your lungs with each breath. Taking long deep breaths may help reverse or decrease the chance of developing breathing (pulmonary) problems (especially infection) following: A long period of time when you are unable to move or be active. BEFORE THE PROCEDURE  If the spirometer includes an indicator to show your best effort, your nurse or respiratory therapist will set it to a desired goal. If possible, sit up straight or lean slightly forward. Try not to slouch. Hold the incentive spirometer in an upright position. INSTRUCTIONS FOR USE  Sit on the edge of your bed if possible, or sit up as far as you can in bed or on a chair. Hold the incentive spirometer in an upright position. Breathe out normally. Place the mouthpiece in your mouth and seal your lips tightly around it. Breathe in slowly and as deeply as possible, raising the piston or the ball toward the top of the column. Hold your breath for 3-5 seconds or for as long as possible. Allow the piston or ball to fall to the bottom of the column. Remove the mouthpiece from your mouth and breathe out normally. Rest for a few seconds and repeat Steps 1 through 7 at least 10 times every 1-2 hours when you are awake. Take your time and take a few normal breaths between deep breaths. The spirometer may include an indicator to show your best  effort. Use the indicator as a goal to work toward during each repetition. After each set of 10 deep breaths, practice coughing to be sure your lungs are clear. If you have an incision (the cut made at the time of surgery), support your incision when coughing by placing a pillow or rolled up towels firmly against it. Once you are able to get out of bed, walk around indoors and cough well. You may stop using the incentive spirometer when instructed by your caregiver.  RISKS AND COMPLICATIONS Take your time so you do not get dizzy or light-headed. If you are in pain, you may need to take or ask for pain medication before doing incentive spirometry. It is harder to take a deep breath if you are having pain. AFTER USE Rest and breathe slowly and easily. It can be helpful to keep track of a log of your progress. Your caregiver can provide you with a simple table to help with this. If you are using the spirometer at home, follow these instructions: [place] IF:  You are having difficultly using the spirometer. You have trouble using the spirometer as often as instructed. Your pain medication is not giving enough relief while using the spirometer. You develop fever of 100.5 F (38.1 C) or higher. SEEK IMMEDIATE MEDICAL CARE IF:  You cough up bloody sputum that had not been present before. You develop fever of 102 F (38.9 C) or greater. You develop worsening pain at or near the incision site. MAKE SURE YOU:  Understand these instructions. Will watch your condition. Will get help right away if you are not doing well or get worse. Document Released: 10/18/2006 Document Revised: 08/30/2011 Document Reviewed: 12/19/2006 [facility] Patient Information 2014 [place], [place].   ________________________________________________________________________

## 2021-06-09 ENCOUNTER — Ambulatory Visit: Payer: Self-pay

## 2021-06-09 ENCOUNTER — Encounter (HOSPITAL_COMMUNITY): Payer: Self-pay | Admitting: Orthopedic Surgery

## 2021-06-09 ENCOUNTER — Other Ambulatory Visit: Payer: Self-pay

## 2021-06-09 ENCOUNTER — Observation Stay (HOSPITAL_COMMUNITY)
Admission: RE | Admit: 2021-06-09 | Discharge: 2021-06-10 | Disposition: A | Discharge status: Home / Self Care | Payer: BC Managed Care – PPO | Source: Ambulatory Visit | Attending: Orthopedic Surgery | Admitting: Orthopedic Surgery

## 2021-06-09 ENCOUNTER — Ambulatory Visit (HOSPITAL_COMMUNITY): Payer: BC Managed Care – PPO | Admitting: Physician Assistant

## 2021-06-09 ENCOUNTER — Encounter (HOSPITAL_COMMUNITY)
Admission: RE | Disposition: A | Discharge status: Home / Self Care | Payer: Self-pay | Source: Ambulatory Visit | Attending: Orthopedic Surgery

## 2021-06-09 ENCOUNTER — Ambulatory Visit (HOSPITAL_COMMUNITY): Payer: BC Managed Care – PPO | Admitting: Anesthesiology

## 2021-06-09 DIAGNOSIS — Z20822 Contact with and (suspected) exposure to covid-19: Secondary | ICD-10-CM | POA: Insufficient documentation

## 2021-06-09 DIAGNOSIS — T8454XA Infection and inflammatory reaction due to internal left knee prosthesis, initial encounter: Secondary | ICD-10-CM | POA: Diagnosis not present

## 2021-06-09 DIAGNOSIS — Z96652 Presence of left artificial knee joint: Secondary | ICD-10-CM | POA: Diagnosis not present

## 2021-06-09 DIAGNOSIS — G8918 Other acute postprocedural pain: Secondary | ICD-10-CM | POA: Diagnosis not present

## 2021-06-09 DIAGNOSIS — Y828 Other medical devices associated with adverse incidents: Secondary | ICD-10-CM | POA: Insufficient documentation

## 2021-06-09 DIAGNOSIS — Z6825 Body mass index (BMI) 25.0-25.9, adult: Secondary | ICD-10-CM | POA: Insufficient documentation

## 2021-06-09 DIAGNOSIS — K279 Peptic ulcer, site unspecified, unspecified as acute or chronic, without hemorrhage or perforation: Secondary | ICD-10-CM

## 2021-06-09 DIAGNOSIS — Z22321 Carrier or suspected carrier of Methicillin susceptible Staphylococcus aureus: Secondary | ICD-10-CM

## 2021-06-09 DIAGNOSIS — T8453XA Infection and inflammatory reaction due to internal right knee prosthesis, initial encounter: Secondary | ICD-10-CM | POA: Diagnosis not present

## 2021-06-09 DIAGNOSIS — E663 Overweight: Secondary | ICD-10-CM | POA: Diagnosis not present

## 2021-06-09 HISTORY — PX: I & D KNEE WITH POLY EXCHANGE: SHX5024

## 2021-06-09 LAB — SARS CORONAVIRUS 2 BY RT PCR (HOSPITAL ORDER, PERFORMED IN ~~LOC~~ HOSPITAL LAB): SARS Coronavirus 2: NEGATIVE

## 2021-06-09 SURGERY — IRRIGATION AND DEBRIDEMENT KNEE WITH POLY EXCHANGE
Anesthesia: Spinal | Site: Knee | Laterality: Left

## 2021-06-09 MED ORDER — METHOCARBAMOL 500 MG PO TABS
500.0000 mg | ORAL_TABLET | Freq: Four times a day (QID) | ORAL | Status: DC | PRN
  Administered 2021-06-10: 08:00:00 500 mg via ORAL
  Filled 2021-06-09 (×2): qty 1

## 2021-06-09 MED ORDER — POVIDONE-IODINE 10 % EX SWAB
2.0000 "application " | Freq: Once | CUTANEOUS | Status: AC
  Administered 2021-06-09: 2 via TOPICAL

## 2021-06-09 MED ORDER — AMISULPRIDE (ANTIEMETIC) 5 MG/2ML IV SOLN: 10.0000 mg | Freq: Once | INTRAVENOUS | Status: DC | PRN

## 2021-06-09 MED ORDER — VANCOMYCIN HCL 1000 MG IV SOLR
INTRAVENOUS | Status: AC
  Filled 2021-06-09: qty 40

## 2021-06-09 MED ORDER — ACETAMINOPHEN 500 MG PO TABS
1000.0000 mg | ORAL_TABLET | Freq: Once | ORAL | Status: AC
  Administered 2021-06-09: 13:00:00 1000 mg via ORAL
  Filled 2021-06-09: qty 2

## 2021-06-09 MED ORDER — PHENOL 1.4 % MT LIQD: 1.0000 | OROMUCOSAL | Status: DC | PRN

## 2021-06-09 MED ORDER — TRANEXAMIC ACID-NACL 1000-0.7 MG/100ML-% IV SOLN
1000.0000 mg | INTRAVENOUS | Status: AC
  Administered 2021-06-09: 17:00:00 1000 mg via INTRAVENOUS
  Filled 2021-06-09: qty 100

## 2021-06-09 MED ORDER — OXYCODONE HCL 5 MG/5ML PO SOLN: 5.0000 mg | Freq: Once | ORAL | Status: DC | PRN

## 2021-06-09 MED ORDER — DOCUSATE SODIUM 100 MG PO CAPS
100.0000 mg | ORAL_CAPSULE | Freq: Two times a day (BID) | ORAL | Status: DC
  Administered 2021-06-09 – 2021-06-10 (×2): 100 mg via ORAL
  Filled 2021-06-09 (×2): qty 1

## 2021-06-09 MED ORDER — TRANEXAMIC ACID-NACL 1000-0.7 MG/100ML-% IV SOLN
1000.0000 mg | Freq: Once | INTRAVENOUS | Status: AC
  Administered 2021-06-09: 21:00:00 1000 mg via INTRAVENOUS
  Filled 2021-06-09: qty 100

## 2021-06-09 MED ORDER — DEXAMETHASONE SODIUM PHOSPHATE 10 MG/ML IJ SOLN
INTRAMUSCULAR | Status: DC | PRN
  Administered 2021-06-09: 10 mg via INTRAVENOUS

## 2021-06-09 MED ORDER — KETOROLAC TROMETHAMINE 30 MG/ML IJ SOLN
INTRAMUSCULAR | Status: AC
  Filled 2021-06-09: qty 1

## 2021-06-09 MED ORDER — OXYCODONE HCL 5 MG PO TABS
10.0000 mg | ORAL_TABLET | ORAL | Status: DC | PRN
  Administered 2021-06-09 – 2021-06-10 (×2): 10 mg via ORAL
  Filled 2021-06-09 (×4): qty 2

## 2021-06-09 MED ORDER — PROPOFOL 10 MG/ML IV BOLUS
INTRAVENOUS | Status: DC | PRN
  Administered 2021-06-09: 30 mg via INTRAVENOUS
  Administered 2021-06-09 (×3): 20 mg via INTRAVENOUS

## 2021-06-09 MED ORDER — FERROUS SULFATE 325 (65 FE) MG PO TABS
325.0000 mg | ORAL_TABLET | Freq: Three times a day (TID) | ORAL | Status: DC
  Administered 2021-06-10: 08:00:00 325 mg via ORAL
  Filled 2021-06-09: qty 1

## 2021-06-09 MED ORDER — ACETAMINOPHEN 500 MG PO TABS
ORAL_TABLET | ORAL | Status: AC
  Filled 2021-06-09: qty 1

## 2021-06-09 MED ORDER — ONDANSETRON HCL 4 MG/2ML IJ SOLN: 4.0000 mg | Freq: Four times a day (QID) | INTRAMUSCULAR | Status: DC | PRN

## 2021-06-09 MED ORDER — VANCOMYCIN HCL 1000 MG IV SOLR
INTRAVENOUS | Status: DC | PRN
  Administered 2021-06-09: 2 g via TOPICAL

## 2021-06-09 MED ORDER — SODIUM CHLORIDE 0.9 % IV SOLN: INTRAVENOUS | Status: DC

## 2021-06-09 MED ORDER — MENTHOL 3 MG MT LOZG: 1.0000 | LOZENGE | OROMUCOSAL | Status: DC | PRN

## 2021-06-09 MED ORDER — DEXAMETHASONE SODIUM PHOSPHATE 10 MG/ML IJ SOLN
10.0000 mg | Freq: Once | INTRAMUSCULAR | Status: AC
  Administered 2021-06-10: 14:00:00 10 mg via INTRAVENOUS
  Filled 2021-06-09 (×2): qty 1

## 2021-06-09 MED ORDER — 0.9 % SODIUM CHLORIDE (POUR BTL) OPTIME
TOPICAL | Status: DC | PRN
  Administered 2021-06-09: 17:00:00 1000 mL

## 2021-06-09 MED ORDER — CEFAZOLIN IN SODIUM CHLORIDE 3-0.9 GM/100ML-% IV SOLN: 3.0000 g | INTRAVENOUS | Status: DC

## 2021-06-09 MED ORDER — ACETAMINOPHEN 325 MG PO TABS
325.0000 mg | ORAL_TABLET | Freq: Four times a day (QID) | ORAL | Status: DC | PRN
  Administered 2021-06-10: 08:00:00 650 mg via ORAL
  Filled 2021-06-09: qty 2

## 2021-06-09 MED ORDER — FENTANYL CITRATE PF 50 MCG/ML IJ SOSY: 25.0000 ug | PREFILLED_SYRINGE | INTRAMUSCULAR | Status: DC | PRN

## 2021-06-09 MED ORDER — LACTATED RINGERS IV SOLN: INTRAVENOUS | Status: DC

## 2021-06-09 MED ORDER — CHLORHEXIDINE GLUCONATE 4 % EX LIQD
60.0000 mL | Freq: Once | CUTANEOUS | Status: AC
  Administered 2021-06-09: 4 via TOPICAL

## 2021-06-09 MED ORDER — METHOCARBAMOL 500 MG IVPB - SIMPLE MED
500.0000 mg | Freq: Four times a day (QID) | INTRAVENOUS | Status: DC | PRN
  Filled 2021-06-09: qty 50

## 2021-06-09 MED ORDER — DIPHENHYDRAMINE HCL 12.5 MG/5ML PO ELIX: 12.5000 mg | ORAL_SOLUTION | ORAL | Status: DC | PRN

## 2021-06-09 MED ORDER — OXYCODONE HCL 5 MG PO TABS: 5.0000 mg | ORAL_TABLET | Freq: Once | ORAL | Status: DC | PRN

## 2021-06-09 MED ORDER — ONDANSETRON HCL 4 MG PO TABS: 4.0000 mg | ORAL_TABLET | Freq: Four times a day (QID) | ORAL | Status: DC | PRN

## 2021-06-09 MED ORDER — HYDROMORPHONE HCL 1 MG/ML IJ SOLN
0.5000 mg | INTRAMUSCULAR | Status: DC | PRN
  Administered 2021-06-09 – 2021-06-10 (×2): 1 mg via INTRAVENOUS
  Filled 2021-06-09 (×2): qty 1

## 2021-06-09 MED ORDER — MIDAZOLAM HCL 2 MG/2ML IJ SOLN: 1.0000 mg | INTRAMUSCULAR | Status: DC

## 2021-06-09 MED ORDER — BUPIVACAINE HCL (PF) 0.5 % IJ SOLN
INTRAMUSCULAR | Status: DC | PRN
  Administered 2021-06-09: 30 mL

## 2021-06-09 MED ORDER — LORATADINE 10 MG PO TABS
10.0000 mg | ORAL_TABLET | Freq: Every day | ORAL | Status: DC
  Filled 2021-06-09 (×2): qty 1

## 2021-06-09 MED ORDER — POLYETHYLENE GLYCOL 3350 17 G PO PACK: 17.0000 g | PACK | Freq: Every day | ORAL | Status: DC | PRN

## 2021-06-09 MED ORDER — ONDANSETRON HCL 4 MG/2ML IJ SOLN: 4.0000 mg | Freq: Once | INTRAMUSCULAR | Status: DC | PRN

## 2021-06-09 MED ORDER — BUPIVACAINE IN DEXTROSE 0.75-8.25 % IT SOLN
INTRATHECAL | Status: DC | PRN
  Administered 2021-06-09: 1.5 mL via INTRATHECAL

## 2021-06-09 MED ORDER — FENTANYL CITRATE PF 50 MCG/ML IJ SOSY: 50.0000 ug | PREFILLED_SYRINGE | INTRAMUSCULAR | Status: DC

## 2021-06-09 MED ORDER — SODIUM CHLORIDE (PF) 0.9 % IJ SOLN
INTRAMUSCULAR | Status: AC
  Filled 2021-06-09: qty 30

## 2021-06-09 MED ORDER — METHOCARBAMOL 500 MG IVPB - SIMPLE MED
INTRAVENOUS | Status: AC
  Administered 2021-06-09: 19:00:00 500 mg via INTRAVENOUS
  Filled 2021-06-09: qty 50

## 2021-06-09 MED ORDER — IRRISEPT - 450ML BOTTLE WITH 0.05% CHG IN STERILE WATER, USP 99.95% OPTIME
TOPICAL | Status: DC | PRN
  Administered 2021-06-09: 17:00:00 450 mL

## 2021-06-09 MED ORDER — LIDOCAINE 2% (20 MG/ML) 5 ML SYRINGE
INTRAMUSCULAR | Status: DC | PRN
  Administered 2021-06-09: 100 mg via INTRAVENOUS

## 2021-06-09 MED ORDER — PROPOFOL 500 MG/50ML IV EMUL
INTRAVENOUS | Status: DC | PRN
Start: 2021-06-09 — End: 2021-06-09
  Administered 2021-06-09: 50 ug/kg/min via INTRAVENOUS

## 2021-06-09 MED ORDER — ASPIRIN 81 MG PO CHEW
81.0000 mg | CHEWABLE_TABLET | Freq: Two times a day (BID) | ORAL | Status: DC
  Administered 2021-06-09 – 2021-06-10 (×2): 81 mg via ORAL
  Filled 2021-06-09 (×2): qty 1

## 2021-06-09 MED ORDER — KETOROLAC TROMETHAMINE 15 MG/ML IJ SOLN
7.5000 mg | Freq: Four times a day (QID) | INTRAMUSCULAR | Status: AC
  Administered 2021-06-09 – 2021-06-10 (×2): 7.5 mg via INTRAVENOUS
  Filled 2021-06-09 (×2): qty 1

## 2021-06-09 MED ORDER — FENTANYL CITRATE PF 50 MCG/ML IJ SOSY
PREFILLED_SYRINGE | INTRAMUSCULAR | Status: AC
  Administered 2021-06-09: 16:00:00 50 ug
  Filled 2021-06-09: qty 2

## 2021-06-09 MED ORDER — BISACODYL 10 MG RE SUPP: 10.0000 mg | Freq: Every day | RECTAL | Status: DC | PRN

## 2021-06-09 MED ORDER — METOCLOPRAMIDE HCL 5 MG/ML IJ SOLN: 5.0000 mg | Freq: Three times a day (TID) | INTRAMUSCULAR | Status: DC | PRN

## 2021-06-09 MED ORDER — MIDAZOLAM HCL 2 MG/2ML IJ SOLN
INTRAMUSCULAR | Status: AC
  Administered 2021-06-09: 16:00:00 2 mg
  Filled 2021-06-09: qty 2

## 2021-06-09 MED ORDER — CEFAZOLIN SODIUM-DEXTROSE 2-4 GM/100ML-% IV SOLN
2.0000 g | INTRAVENOUS | Status: AC
  Administered 2021-06-09: 17:00:00 2 g via INTRAVENOUS
  Filled 2021-06-09: qty 100

## 2021-06-09 MED ORDER — ORAL CARE MOUTH RINSE: 15.0000 mL | Freq: Once | OROMUCOSAL | Status: AC

## 2021-06-09 MED ORDER — CELECOXIB 200 MG PO CAPS
200.0000 mg | ORAL_CAPSULE | Freq: Two times a day (BID) | ORAL | Status: DC
  Administered 2021-06-09 – 2021-06-10 (×2): 200 mg via ORAL
  Filled 2021-06-09 (×2): qty 1

## 2021-06-09 MED ORDER — TRAZODONE HCL 50 MG PO TABS
50.0000 mg | ORAL_TABLET | Freq: Every day | ORAL | Status: DC
  Administered 2021-06-09: 21:00:00 50 mg via ORAL
  Filled 2021-06-09: qty 1

## 2021-06-09 MED ORDER — OXYCODONE HCL 5 MG PO TABS
5.0000 mg | ORAL_TABLET | ORAL | Status: DC | PRN
  Administered 2021-06-10 (×2): 10 mg via ORAL
  Filled 2021-06-09: qty 2

## 2021-06-09 MED ORDER — CHLORHEXIDINE GLUCONATE 0.12 % MT SOLN
15.0000 mL | Freq: Once | OROMUCOSAL | Status: AC
  Administered 2021-06-09: 13:00:00 15 mL via OROMUCOSAL

## 2021-06-09 MED ORDER — SODIUM CHLORIDE 0.9 % IV SOLN
INTRAVENOUS | Status: AC | PRN
  Administered 2021-06-09: 500 mL

## 2021-06-09 MED ORDER — BUPIVACAINE-EPINEPHRINE (PF) 0.25% -1:200000 IJ SOLN
INTRAMUSCULAR | Status: AC
  Filled 2021-06-09: qty 30

## 2021-06-09 MED ORDER — CEFAZOLIN SODIUM-DEXTROSE 2-4 GM/100ML-% IV SOLN
2.0000 g | Freq: Four times a day (QID) | INTRAVENOUS | Status: AC
  Administered 2021-06-09 – 2021-06-10 (×2): 2 g via INTRAVENOUS
  Filled 2021-06-09 (×2): qty 100

## 2021-06-09 MED ORDER — METOCLOPRAMIDE HCL 5 MG PO TABS: 5.0000 mg | ORAL_TABLET | Freq: Three times a day (TID) | ORAL | Status: DC | PRN

## 2021-06-09 SURGICAL SUPPLY — 44 items
ATTUNE PSRP INSR SZ6 8 KNEE (Insert) ×1 IMPLANT
ATTUNE PSRP INSR SZ6 8MM KNEE (Insert) ×1 IMPLANT
BAG COUNTER SPONGE SURGICOUNT (BAG) IMPLANT
BAG SURGICOUNT SPONGE COUNTING (BAG)
BAG ZIPLOCK 12X15 (MISCELLANEOUS) ×3 IMPLANT
BNDG ELASTIC 4X5.8 VLCR STR LF (GAUZE/BANDAGES/DRESSINGS) IMPLANT
BNDG ELASTIC 6X5.8 VLCR STR LF (GAUZE/BANDAGES/DRESSINGS) ×3 IMPLANT
COVER SURGICAL LIGHT HANDLE (MISCELLANEOUS) ×3 IMPLANT
CUFF TOURN SGL QUICK 34 (TOURNIQUET CUFF) ×3
CUFF TRNQT CYL 34X4.125X (TOURNIQUET CUFF) ×1 IMPLANT
DECANTER SPIKE VIAL GLASS SM (MISCELLANEOUS) ×3 IMPLANT
DERMABOND ADVANCED (GAUZE/BANDAGES/DRESSINGS) ×2
DERMABOND ADVANCED .7 DNX12 (GAUZE/BANDAGES/DRESSINGS) IMPLANT
DRAPE INCISE IOBAN 66X45 STRL (DRAPES) ×9 IMPLANT
DRAPE U-SHAPE 47X51 STRL (DRAPES) ×3 IMPLANT
DRESSING AQUACEL AG SP 3.5X10 (GAUZE/BANDAGES/DRESSINGS) IMPLANT
DRSG AQUACEL AG ADV 3.5X10 (GAUZE/BANDAGES/DRESSINGS) IMPLANT
DRSG AQUACEL AG ADV 3.5X14 (GAUZE/BANDAGES/DRESSINGS) IMPLANT
DRSG AQUACEL AG SP 3.5X10 (GAUZE/BANDAGES/DRESSINGS) ×3
DURAPREP 26ML APPLICATOR (WOUND CARE) ×6 IMPLANT
ELECT REM PT RETURN 15FT ADLT (MISCELLANEOUS) ×3 IMPLANT
GLOVE SURG UNDER POLY LF SZ7.5 (GLOVE) ×6 IMPLANT
GOWN STRL REUS W/TWL LRG LVL3 (GOWN DISPOSABLE) ×3 IMPLANT
HANDPIECE INTERPULSE COAX TIP (DISPOSABLE) ×3
JET LAVAGE IRRISEPT WOUND (IRRIGATION / IRRIGATOR) ×3
KIT TURNOVER KIT A (KITS) IMPLANT
LAVAGE JET IRRISEPT WOUND (IRRIGATION / IRRIGATOR) ×1 IMPLANT
MANIFOLD NEPTUNE II (INSTRUMENTS) ×3 IMPLANT
NS IRRIG 1000ML POUR BTL (IV SOLUTION) ×3 IMPLANT
PACK TOTAL KNEE CUSTOM (KITS) ×3 IMPLANT
PROTECTOR NERVE ULNAR (MISCELLANEOUS) ×3 IMPLANT
SET HNDPC FAN SPRY TIP SCT (DISPOSABLE) ×1 IMPLANT
SET PAD KNEE POSITIONER (MISCELLANEOUS) ×3 IMPLANT
STAPLER VISISTAT 35W (STAPLE) IMPLANT
SUT MNCRL AB 3-0 PS2 18 (SUTURE) IMPLANT
SUT STRATAFIX PDS+ 0 24IN (SUTURE) ×3 IMPLANT
SUT VIC AB 1 CT1 36 (SUTURE) ×3 IMPLANT
SUT VIC AB 2-0 CT1 27 (SUTURE) ×9
SUT VIC AB 2-0 CT1 TAPERPNT 27 (SUTURE) ×3 IMPLANT
SWAB COLLECTION DEVICE MRSA (MISCELLANEOUS) IMPLANT
SWAB CULTURE ESWAB REG 1ML (MISCELLANEOUS) IMPLANT
TRAY FOLEY MTR SLVR 16FR STAT (SET/KITS/TRAYS/PACK) ×3 IMPLANT
TUBE SUCTION HIGH CAP CLEAR NV (SUCTIONS) ×2 IMPLANT
WRAP KNEE MAXI GEL POST OP (GAUZE/BANDAGES/DRESSINGS) ×3 IMPLANT

## 2021-06-09 NOTE — Progress Notes (Signed)
AssistedDr. Carolyn Witman with left, ultrasound guided, adductor canal block. Side rails up, monitors on throughout procedure. See vital signs in flow sheet. Tolerated Procedure well.  

## 2021-06-09 NOTE — Anesthesia Postprocedure Evaluation (Signed)
Anesthesia Post Note  Patient: Harry Cobb  Procedure(s) Performed: IRRIGATION AND DEBRIDEMENT KNEE WITH POLY REVISION (Left: Knee)     Patient location during evaluation: PACU Anesthesia Type: Spinal Level of consciousness: oriented and awake and alert Pain management: pain level controlled Vital Signs Assessment: post-procedure vital signs reviewed and stable Respiratory status: spontaneous breathing, respiratory function stable and nonlabored ventilation Cardiovascular status: blood pressure returned to baseline and stable Postop Assessment: no headache, no backache, no apparent nausea or vomiting and spinal receding Anesthetic complications: no   No notable events documented.  Last Vitals:  Vitals:   06/09/21 1845 06/09/21 1852  BP: (!) 121/91 127/81  Pulse: (!) 59 (!) 51  Resp: 16 12  Temp:    SpO2: 100% 96%    Last Pain:  Vitals:   06/09/21 1824  TempSrc:   PainSc: Asleep   Pain Goal:    LLE Motor Response: Purposeful movement (06/09/21 1824) LLE Sensation: Decreased, Numbness (06/09/21 1824) RLE Motor Response: Purposeful movement (06/09/21 1824) RLE Sensation: Decreased, Numbness (06/09/21 1824) L Sensory Level: L5-Outer lower leg, top of foot, great toe (06/09/21 1824) R Sensory Level: L5-Outer lower leg, top of foot, great toe (06/09/21 1824) Epidural/Spinal Function Patient able to flex knees: Yes (06/09/21 1824), Patient able to lift hips off bed: No (06/09/21 1824), Back pain beyond tenderness at insertion site: No (06/09/21 1824), Progressively worsening motor and/or sensory loss: No (06/09/21 1824), Bowel and/or bladder incontinence post epidural: No (06/09/21 1824)  Lidia Collum

## 2021-06-09 NOTE — Anesthesia Procedure Notes (Signed)
Anesthesia Regional Block: Adductor canal block   Pre-Anesthetic Checklist: , timeout performed,  Correct Patient, Correct Site, Correct Laterality,  Correct Procedure, Correct Position, site marked,  Risks and benefits discussed,  Surgical consent,  Pre-op evaluation,  At surgeon's request and post-op pain management  Laterality: Left  Prep: chloraprep       Needles:  Injection technique: Single-shot  Needle Type: Echogenic Stimulator Needle     Needle Length: 10cm  Needle Gauge: 20     Additional Needles:   Procedures:,,,, ultrasound used (permanent image in chart),,    Narrative:  Start time: 06/09/2021 4:12 PM End time: 06/09/2021 4:16 PM Injection made incrementally with aspirations every 5 mL.  Performed by: Personally  Anesthesiologist: Lidia Collum, MD  Additional Notes: Standard monitors applied. Skin prepped. Good needle visualization with ultrasound. Injection made in 5cc increments with no resistance to injection. Patient tolerated the procedure well.

## 2021-06-09 NOTE — Brief Op Note (Signed)
06/09/2021  3:15 PM  PATIENT:  Harry Cobb  60 y.o. male  PRE-OPERATIVE DIAGNOSIS:  Infected left total knee arthroplasty revision  POST-OPERATIVE DIAGNOSIS:  Infected left total knee arthroplasty revision  PROCEDURE:  Procedure(s): IRRIGATION AND DEBRIDEMENT KNEE WITH POLY REVISION (Left)  SURGEON:  Surgeon(s) and Role:    Paralee Cancel, MD - Primary  PHYSICIAN ASSISTANT: Costella Hatcher, PA-C  ANESTHESIA:   regional and spinal  EBL:  <200 cc  BLOOD ADMINISTERED:none  DRAINS: none   LOCAL MEDICATIONS USED:  OTHER Vancomycin powder, 2gm  SPECIMEN:  Source of Specimen:  left knee prepatellar swabs and left knee intra-articular fluid in syringe  DISPOSITION OF SPECIMEN:  PATHOLOGY  COUNTS:  YES  TOURNIQUET:  31 min at 250 mmHg  DICTATION: .Other Dictation: Dictation Number [id number]  PLAN OF CARE: Admit for overnight observation  PATIENT DISPOSITION:  PACU - hemodynamically stable.   Delay start of Pharmacological VTE agent (>24hrs) due to surgical blood loss or risk of bleeding: no

## 2021-06-09 NOTE — Interval H&P Note (Signed)
History and Physical Interval Note:  06/09/2021 1:44 PM  Harry Cobb  has presented today for surgery, with the diagnosis of Infected left total knee arthroplasty revision.  The various methods of treatment have been discussed with the patient and family. After consideration of risks, benefits and other options for treatment, the patient has consented to  Procedure(s): IRRIGATION AND DEBRIDEMENT KNEE WITH POLY REVISION (Left) as a surgical intervention.  The patient's history has been reviewed, patient examined, no change in status, stable for surgery.  I have reviewed the patient's chart and labs.  Questions were answered to the patient's satisfaction.     Mauri Pole

## 2021-06-09 NOTE — Transfer of Care (Signed)
Immediate Anesthesia Transfer of Care Note  Patient: Harry Cobb  Procedure(s) Performed: IRRIGATION AND DEBRIDEMENT KNEE WITH POLY REVISION (Left: Knee)  Patient Location: PACU  Anesthesia Type:Spinal  Level of Consciousness: sedated, patient cooperative and responds to stimulation  Airway & Oxygen Therapy: Patient Spontanous Breathing and Patient connected to face mask oxygen  Post-op Assessment: Report given to RN and Post -op Vital signs reviewed and stable  Post vital signs: Reviewed and stable  Last Vitals:  Vitals Value Taken Time  BP 114/82 06/09/21 1824  Temp 36.7 C 06/09/21 1824  Pulse 58 06/09/21 1827  Resp 18 06/09/21 1827  SpO2 97 % 06/09/21 1827  Vitals shown include unvalidated device data.  Last Pain:  Vitals:   06/09/21 1614  TempSrc:   PainSc: 0-No pain         Complications: No notable events documented.

## 2021-06-09 NOTE — Progress Notes (Signed)
Orthopedic Tech Progress Note Patient Details:  Harry Cobb Apr 06, 1961 [id number]  Patient ID: [name], male   DOB: [birth year], 60 y.o.   MRN: [id number]  Quamir, Willemsen 06/09/2021, 6:52 PM Cpm applied in pacu

## 2021-06-09 NOTE — Progress Notes (Signed)
Order for PICC placement. Dahal RN contacted to see if patient was amenable to having PICC placed tonight. She stated patient wanted to wait until 12-21 AM. VAST to follow up on 12-21 AM.

## 2021-06-09 NOTE — Anesthesia Preprocedure Evaluation (Signed)
Anesthesia Evaluation  Patient identified by MRN, date of birth, ID band Patient awake    Reviewed: Allergy & Precautions, NPO status , Patient's Chart, lab work & pertinent test results  History of Anesthesia Complications Negative for: history of anesthetic complications  Airway Mallampati: III  TM Distance: >3 FB Neck ROM: Full    Dental   Pulmonary neg pulmonary ROS,    Pulmonary exam normal        Cardiovascular negative cardio ROS Normal cardiovascular exam     Neuro/Psych negative neurological ROS     GI/Hepatic Neg liver ROS, GERD  ,  Endo/Other  negative endocrine ROS  Renal/GU negative Renal ROS  negative genitourinary   Musculoskeletal  (+) Arthritis ,   Abdominal   Peds  Hematology negative hematology ROS (+)   Anesthesia Other Findings   Reproductive/Obstetrics                            Anesthesia Physical Anesthesia Plan  ASA: 2  Anesthesia Plan: Spinal   Post-op Pain Management: Regional block and Tylenol PO (pre-op)   Induction:   PONV Risk Score and Plan: 1 and Propofol infusion, Treatment may vary due to age or medical condition, Ondansetron, TIVA and Dexamethasone  Airway Management Planned: Nasal Cannula and Simple Face Mask  Additional Equipment: None  Intra-op Plan:   Post-operative Plan:   Informed Consent: I have reviewed the patients History and Physical, chart, labs and discussed the procedure including the risks, benefits and alternatives for the proposed anesthesia with the patient or authorized representative who has indicated his/her understanding and acceptance.       Plan Discussed with:   Anesthesia Plan Comments:         Anesthesia Quick Evaluation

## 2021-06-09 NOTE — Discharge Instructions (Signed)

## 2021-06-09 NOTE — Anesthesia Procedure Notes (Signed)
Spinal  Patient location during procedure: OR Reason for block: surgical anesthesia Staffing Performed: anesthesiologist  Anesthesiologist: Lidia Collum, MD Preanesthetic Checklist Completed: patient identified, IV checked, risks and benefits discussed, surgical consent, monitors and equipment checked, pre-op evaluation and timeout performed Spinal Block Patient position: sitting Prep: DuraPrep and site prepped and draped Patient monitoring: continuous pulse ox, blood pressure and heart rate Approach: midline Location: L3-4 Injection technique: single-shot Needle Needle type: Pencan  Needle gauge: 24 G Needle length: 10 cm Assessment Events: CSF return Additional Notes Functioning IV was confirmed and monitors were applied. Sterile prep and drape, including hand hygiene and sterile gloves were used. The patient was positioned and the spine was prepped. The skin was anesthetized with lidocaine.  Free flow of clear CSF was obtained prior to injecting local anesthetic into the CSF. The needle was carefully withdrawn. The patient tolerated the procedure well.

## 2021-06-09 NOTE — H&P (Signed)
TOTAL KNEE I&D ADMISSION H&P  Patient is being admitted for left total knee arthroplasty I&D  Subjective:  Chief Complaint:left knee pain, acute post op infection  HPI: Harry Cobb, 60 y.o. 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 He underwent left total knee arthroplasty revision on 04/28/21 by Dr. Alvan Dame. He had an early post-op prepatellar seroma, which was aspirated in the office at 3 week and 4 weeks post op. There were no signs of infection based on the appearance of his knee or the aspirate. He began developing intermittent fevers around 4 weeks post op, and was evaluated by his PCP to evaluate for other source of infection which was negative. Dr. Alvan Dame aspirated his   Patient Active Problem List   Diagnosis Date Noted   S/P revision of total knee, left 04/28/2021   S/P lumbar fusion 06/23/2020   DDD (degenerative disc disease), cervical 12/13/2019   Lumbar post-laminectomy syndrome 12/13/2019   Degeneration of lumbar intervertebral disc 12/13/2019   Impingement syndrome of left shoulder region 06/23/2018   Pain in joint of left shoulder 04/26/2018   Low back pain 08/24/2017   Knee stiffness, left 08/03/2016   History of total knee replacement, left 08/12/2015   Expected blood loss anemia, postoperative 11/21/2012   Overweight (BMI 25.0-29.9) 11/21/2012   S/P left UKA to TKA 62/26/3335   Umbilical hernia 45/62/5638   Past Medical History:  Diagnosis Date   Arthritis    Back pain    radiates into the left posterior thigh to the calf.   GERD (gastroesophageal reflux disease)    History of kidney stones    Left shoulder pain    Leg pain    Worse than back pain   Lumbar foraminal stenosis    No pertinent past medical history    Radiculopathy, lumbar region     Past Surgical History:  Procedure Laterality Date   ABDOMINAL EXPOSURE N/A 06/23/2020   Procedure: ABDOMINAL EXPOSURE;  Surgeon: Marty Heck, MD;  Location: [facility];  Service: Vascular;  Laterality: N/A;   ANTERIOR LUMBAR FUSION N/A 06/23/2020   Procedure: Anterior Lumbar Interbody Fusion - Lumbar five-Sacral one;  Surgeon: Eustace Moore, MD;  Location: [facility];  Service: Neurosurgery;  Laterality: N/A;   BACK SURGERY  2009   BICEPS TENDON REPAIR  2011   EYE SURGERY     Lasik   JOINT REPLACEMENT     both partial knee replacements   KNEE ARTHROSCOPY  11/12   lt   KNEE SURGERY  2009/2011   Rt and Lt knee replacment   LASER SPINE SURGERY     SCAR REVISION Left 08/03/2016   Procedure: Left hip open scar debridement ;  Surgeon: Paralee Cancel, MD;  Location: [facility];  Service: Orthopedics;  Laterality: Left;   shoulder cyst  06/04/11   right cyst removed   TONSILLECTOMY     TOTAL HIP ARTHROPLASTY Left 08/12/2015   Procedure: LEFT TOTAL HIP ARTHROPLASTY ANTERIOR APPROACH;  Surgeon: Paralee Cancel, MD;  Location: [facility];  Service: Orthopedics;  Laterality: Left;   TOTAL KNEE ARTHROPLASTY Left 11/20/2012   Procedure: CONVERT LEFT UNI KNEE ARTHROPLASTY TO LEFT TOTAL KNEE ARTHROPLASTY;  Surgeon: Mauri Pole, MD;  Location: [facility];  Service: Orthopedics;  Laterality: Left;   TOTAL KNEE REVISION Left 04/28/2021   Procedure: TOTAL KNEE REVISION;  Surgeon: Paralee Cancel, MD;  Location: [facility];  Service: Orthopedics;  Laterality: Left;   TOTAL KNEE REVISION  WITH SCAR DEBRIDEMENT/PATELLA REVISION WITH POLY EXCHANGE Left 08/03/2016   Procedure: Left knee open scar debridement, poly exchange ;  Surgeon: Paralee Cancel, MD;  Location: [facility];  Service: Orthopedics;  Laterality: Left;   UMBILICAL HERNIA REPAIR  06/04/2011   Procedure: HERNIA REPAIR UMBILICAL ADULT;  Surgeon: Rolm Bookbinder, MD;  Location: [facility];  Service: General;  Laterality: N/A;  umbilical hernia repiar and mesh and removal of lympoma on right shoulder    No current facility-administered medications for this encounter.   Current Outpatient Medications  Medication Sig Dispense Refill  Last Dose   acetaminophen (TYLENOL) 325 MG tablet Take 1-2 tablets (325-650 mg total) by mouth every 6 (six) hours as needed for mild pain (pain score 1-3 or temp > 100.5).      celecoxib (CELEBREX) 200 MG capsule Take 1 capsule (200 mg total) by mouth 2 (two) times daily. 60 capsule 0    docusate sodium (COLACE) 100 MG capsule Take 1 capsule (100 mg total) by mouth 2 (two) times daily. 10 capsule 0    doxycycline (VIBRA-TABS) 100 MG tablet Take 100 mg by mouth 2 (two) times daily. For 14 days      fexofenadine (ALLEGRA) 180 MG tablet Take 180 mg by mouth daily.      methocarbamol (ROBAXIN) 500 MG tablet Take 1 tablet (500 mg total) by mouth every 6 (six) hours as needed for muscle spasms. 40 tablet 0    oxyCODONE (OXY IR/ROXICODONE) 5 MG immediate release tablet Take 1-2 tablets (5-10 mg total) by mouth every 4 (four) hours as needed for severe pain (pain score 4-6). 56 tablet 0    polyethylene glycol (MIRALAX / GLYCOLAX) 17 g packet Take 17 g by mouth daily as needed for mild constipation. 14 each 0    traZODone (DESYREL) 50 MG tablet Take 1 tablet (50 mg total) by mouth at bedtime. Take 1 tablet at bedtime as needed for sleep 30 tablet 0    No Known Allergies  Social History   Tobacco Use   Smoking status: Never   Smokeless tobacco: Never  Substance Use Topics   Alcohol use: Yes    Comment: occassionally    No family history on file.    Review of Systems  Constitutional:  Negative for chills and fever.  Respiratory:  Negative for cough and shortness of breath.   Cardiovascular:  Negative for chest pain.  Gastrointestinal:  Negative for nausea and vomiting.  Musculoskeletal:  Positive for arthralgias.     Objective:  Physical Exam Pleasant 60 year old male awake alert and oriented. He is in no acute distress. He walks in the office with a postoperative limp.  Left knee exam: His incision remains well-healed No significant warmth erythema Ballotable prepatellar swelling Lacks  5 degrees of extension and is currently flexing with physical therapy over 105 degrees representing significant gains towards our goal of improved motion following the surgery  Vital signs in last 24 hours: Temp:  [97.9 F (36.6 C)] 97.9 F (36.6 C) (12/19 1112) Pulse Rate:  [61] 61 (12/19 1112) Resp:  [18] 18 (12/19 1112) BP: (137)/(92) 137/92 (12/19 1112) SpO2:  [100 %] 100 % (12/19 1112) Weight:  [98.4 kg] 98.4 kg (12/19 1112)  Labs:  Estimated body mass index is 29.43 kg/m as calculated from the following:   Height as of 06/08/21: 6' (1.829 m).   Weight as of 06/08/21: 98.4 kg.  Imaging Review     Assessment/Plan:  Assessment: Infected left total knee  arthroplasty revision  Plan: Dr. Alvan Dame discussed this diagnosis and proposed management with the patient. We will plan on irrigation and debridement with polyethylene exchange. He will receive a PICC line and we will obtain recommendations from ID. We will upload synovasure analysis results from the office for use.

## 2021-06-09 NOTE — Progress Notes (Signed)
Patient ID: Harry Cobb, male   DOB: 08/10/1960, 61 y.o.   MRN: [id number]

## 2021-06-10 ENCOUNTER — Encounter (HOSPITAL_COMMUNITY): Payer: Self-pay | Admitting: Orthopedic Surgery

## 2021-06-10 DIAGNOSIS — Z22321 Carrier or suspected carrier of Methicillin susceptible Staphylococcus aureus: Secondary | ICD-10-CM | POA: Diagnosis not present

## 2021-06-10 DIAGNOSIS — T8454XA Infection and inflammatory reaction due to internal left knee prosthesis, initial encounter: Principal | ICD-10-CM

## 2021-06-10 DIAGNOSIS — K279 Peptic ulcer, site unspecified, unspecified as acute or chronic, without hemorrhage or perforation: Secondary | ICD-10-CM

## 2021-06-10 DIAGNOSIS — E663 Overweight: Secondary | ICD-10-CM | POA: Diagnosis not present

## 2021-06-10 DIAGNOSIS — Z20822 Contact with and (suspected) exposure to covid-19: Secondary | ICD-10-CM | POA: Diagnosis not present

## 2021-06-10 DIAGNOSIS — Y828 Other medical devices associated with adverse incidents: Secondary | ICD-10-CM | POA: Diagnosis not present

## 2021-06-10 DIAGNOSIS — Z6825 Body mass index (BMI) 25.0-25.9, adult: Secondary | ICD-10-CM | POA: Diagnosis not present

## 2021-06-10 LAB — CBC
HCT: 35.5 % — ABNORMAL LOW (ref 39.0–52.0)
Hemoglobin: 12.1 g/dL — ABNORMAL LOW (ref 13.0–17.0)
MCH: 30.6 pg (ref 26.0–34.0)
MCHC: 34.1 g/dL (ref 30.0–36.0)
MCV: 89.9 fL (ref 80.0–100.0)
Platelets: 207 10*3/uL (ref 150–400)
RBC: 3.95 MIL/uL — ABNORMAL LOW (ref 4.22–5.81)
RDW: 12.2 % (ref 11.5–15.5)
WBC: 8.1 10*3/uL (ref 4.0–10.5)
nRBC: 0 % (ref 0.0–0.2)

## 2021-06-10 LAB — BASIC METABOLIC PANEL
Anion gap: 8 (ref 5–15)
BUN: 24 mg/dL — ABNORMAL HIGH (ref 6–20)
CO2: 22 mmol/L (ref 22–32)
Calcium: 8.7 mg/dL — ABNORMAL LOW (ref 8.9–10.3)
Chloride: 107 mmol/L (ref 98–111)
Creatinine, Ser: 1.07 mg/dL (ref 0.61–1.24)
GFR, Estimated: >60 mL/min (ref >60)
Glucose, Bld: 169 mg/dL — ABNORMAL HIGH (ref 70–99)
Potassium: 4.3 mmol/L (ref 3.5–5.1)
Sodium: 137 mmol/L (ref 135–145)

## 2021-06-10 LAB — C-REACTIVE PROTEIN: CRP: 1 mg/dL — ABNORMAL HIGH (ref <1.0)

## 2021-06-10 LAB — HIV ANTIBODY (ROUTINE TESTING W REFLEX): HIV Screen 4th Generation wRfx: NONREACTIVE

## 2021-06-10 LAB — HEPATITIS A ANTIBODY, TOTAL: hep A Total Ab: REACTIVE — AB

## 2021-06-10 LAB — SEDIMENTATION RATE: Sed Rate: 30 mm/hr — ABNORMAL HIGH (ref 0–16)

## 2021-06-10 MED ORDER — CEFAZOLIN IV (FOR PTA / DISCHARGE USE ONLY): 2.0000 g | Freq: Three times a day (TID) | INTRAVENOUS | 0 refills | Status: AC

## 2021-06-10 MED ORDER — SODIUM CHLORIDE 0.9% FLUSH: 10.0000 mL | INTRAVENOUS | Status: DC | PRN

## 2021-06-10 MED ORDER — CELECOXIB 200 MG PO CAPS
200.0000 mg | ORAL_CAPSULE | Freq: Two times a day (BID) | ORAL | 0 refills | Status: DC
Start: 2021-06-10 — End: 2021-07-09

## 2021-06-10 MED ORDER — OXYCODONE HCL 5 MG PO TABS
5.0000 mg | ORAL_TABLET | ORAL | 0 refills | Status: DC | PRN
Start: 2021-06-10 — End: 2021-08-05

## 2021-06-10 MED ORDER — ASPIRIN 81 MG PO CHEW
81.0000 mg | CHEWABLE_TABLET | Freq: Two times a day (BID) | ORAL | 0 refills | Status: AC
Start: 2021-06-10 — End: 2021-07-08

## 2021-06-10 MED ORDER — TRANEXAMIC ACID 650 MG PO TABS
1950.0000 mg | ORAL_TABLET | Freq: Every day | ORAL | Status: DC
  Administered 2021-06-10: 14:00:00 1950 mg via ORAL
  Filled 2021-06-10 (×2): qty 3

## 2021-06-10 MED ORDER — TRANEXAMIC ACID 650 MG PO TABS
1950.0000 mg | ORAL_TABLET | Freq: Every day | ORAL | 0 refills | Status: AC
Start: 2021-06-10 — End: 2021-06-17

## 2021-06-10 MED ORDER — METHOCARBAMOL 500 MG PO TABS: 500.0000 mg | ORAL_TABLET | Freq: Four times a day (QID) | ORAL | 0 refills | Status: DC | PRN

## 2021-06-10 MED ORDER — HEPARIN SOD (PORK) LOCK FLUSH 100 UNIT/ML IV SOLN
250.0000 [IU] | INTRAVENOUS | Status: AC | PRN
  Administered 2021-06-10: 14:00:00 250 [IU]
  Filled 2021-06-10: qty 2.5

## 2021-06-10 MED ORDER — CEFAZOLIN SODIUM-DEXTROSE 2-4 GM/100ML-% IV SOLN
2.0000 g | Freq: Three times a day (TID) | INTRAVENOUS | Status: DC
  Administered 2021-06-10: 13:00:00 2 g via INTRAVENOUS
  Filled 2021-06-10: qty 100

## 2021-06-10 MED ORDER — CHLORHEXIDINE GLUCONATE CLOTH 2 % EX PADS: 6.0000 | MEDICATED_PAD | Freq: Every day | CUTANEOUS | Status: DC

## 2021-06-10 NOTE — Progress Notes (Addendum)
° °  Subjective: 1 Day Post-Op Procedure(s) (LRB): IRRIGATION AND DEBRIDEMENT KNEE WITH POLY REVISION (Left) Patient reports pain as mild.   Patient seen in rounds by Dr. Alvan Dame. Patient is well, and has had no acute complaints or problems. No acute events overnight. Foley catheter removed. PICC line to be placed today.  We will start therapy today.   Objective: Vital signs in last 24 hours: Temp:  [97.8 F (36.6 C)-98.1 F (36.7 C)] 97.8 F (36.6 C) (12/21 0600) Pulse Rate:  [51-63] 53 (12/21 0600) Resp:  [12-18] 16 (12/21 0600) BP: (104-141)/(70-94) 117/80 (12/21 0600) SpO2:  [94 %-100 %] 95 % (12/21 0600) Weight:  [101 kg] 101 kg (12/20 2131)  Intake/Output from previous day:  Intake/Output Summary (Last 24 hours) at 06/10/2021 0741 Last data filed at 06/10/2021 0426 Gross per 24 hour  Intake 1760.07 ml  Output 1575 ml  Net 185.07 ml     Intake/Output this shift: No intake/output data recorded.  Labs: Recent Labs    06/08/21 1122 06/10/21 0401  HGB 13.3 12.1*   Recent Labs    06/08/21 1122 06/10/21 0401  WBC 5.0 8.1  RBC 4.24 3.95*  HCT 38.6* 35.5*  PLT 207 207   Recent Labs    06/08/21 1122 06/10/21 0401  NA 138 137  K 3.8 4.3  CL 107 107  CO2 22 22  BUN 22* 24*  CREATININE 0.87 1.07  GLUCOSE 110* 169*  CALCIUM 8.9 8.7*   No results for input(s): LABPT, INR in the last 72 hours.  Exam: General - Patient is Alert and Oriented Extremity - Neurologically intact Sensation intact distally Intact pulses distally Dorsiflexion/Plantar flexion intact Dressing - dressing C/D/I Motor Function - intact, moving foot and toes well on exam.   Past Medical History:  Diagnosis Date   Arthritis    Back pain    radiates into the left posterior thigh to the calf.   GERD (gastroesophageal reflux disease)    History of kidney stones    Left shoulder pain    Leg pain    Worse than back pain   Lumbar foraminal stenosis    No pertinent past medical history     Radiculopathy, lumbar region     Assessment/Plan: 1 Day Post-Op Procedure(s) (LRB): IRRIGATION AND DEBRIDEMENT KNEE WITH POLY REVISION (Left) Principal Problem:   Infection of prosthetic left knee joint (HCC)  Estimated body mass index is 30.2 kg/m as calculated from the following:   Height as of this encounter: 6' (1.829 m).   Weight as of this encounter: 101 kg. Advance diet Up with therapy   DVT Prophylaxis - Aspirin Weight bearing as tolerated.  Hgb stable at 12.1 this AM.  Plan for PICC line placement today.  Culture from the office grew Staph Lugdunensis. Sensitivities in chart under progress note. Infectious disease consulted, appreciate their recommendations on home ABX regimen.  Will give oral TXA daily for prevention of seroma.   CPM today.  If we are able to arrange home health needs today, he may discharge home. Otherwise, continue inpatient therapy and discharge home when this is arranged.   Griffith Citron, PA-C Orthopedic Surgery [phone] 06/10/2021, 7:41 AM

## 2021-06-10 NOTE — Consult Note (Signed)
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 Peptic ulcer disease in the context of nonsteroidal anti-inflammatory drugs History of L5-S1 fusion  Plan:  We will start cefazolin 2 g IV every 8 hours and plan on 6 weeks of treatment Will then move over to oral therapy likely with cefadroxil to 500 mg twice daily to get him through a minimum of 6 months but more likely a year of therapy Follow-up intraoperative cultures in case there is a different organism than the Staphylococcus lugdunensis playing a role Check baseline sed rate CRP Screen for HIV and viral hepatitides   Diagnosis: Prosthetic knee infection  Culture Result: Left to coccus lugdunensis  No Known Allergies  OPAT Orders Discharge antibiotics to be given via PICC line Discharge antibiotics: Cefazolin Duration: 6 weeks End Date: July 22, 2021  [place] Per Protocol:  Home health RN for IV administration and teaching; PICC line care and labs.    Labs weekly while on IV antibiotics: _x_ CBC with differential  _x_ CMP _x_ CRP _x_ ESR  _x_ Please pull PIC at completion of IV antibiotics __ Please leave PIC in place until doctor has seen patient or been notified  Fax weekly labs to [phone]  Clinic Follow Up Appt:   Harry Cobb has an appointment on 07/09/2021 at 38 with Dr. Juleen China  [facility] is located in the [facility]  [place] in [place].  [address], which is located to the left of the elevators.  Phone: [phone]  Fax: [phone]  [email or web address]  He should arrive 30 minutes prior to his appointment.  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   PUD (peptic ulcer disease)   Scheduled Meds:  aspirin  81 mg Oral BID   celecoxib  200 mg Oral BID   Chlorhexidine Gluconate Cloth  6 each Topical Daily   dexamethasone (DECADRON) injection  10 mg Intravenous Once   docusate sodium  100 mg Oral BID   ferrous sulfate  325 mg Oral TID PC   loratadine  10 mg Oral Daily   tranexamic acid  1,950 mg Oral Daily   traZODone  50 mg Oral QHS   Continuous Infusions:  sodium chloride 75 mL/hr at 06/09/21 1951    ceFAZolin (ANCEF) IV 2 g (06/10/21 1321)   methocarbamol (ROBAXIN) IV 500 mg (06/09/21 1852)   PRN Meds:.acetaminophen, bisacodyl, diphenhydrAMINE, HYDROmorphone (DILAUDID) injection, menthol-cetylpyridinium **OR** phenol, methocarbamol **OR** methocarbamol (ROBAXIN) IV, metoCLOPramide **OR** metoCLOPramide (REGLAN) injection, ondansetron **OR** ondansetron (ZOFRAN) IV, oxyCODONE, oxyCODONE, polyethylene glycol, sodium chloride flush  HPI: Harry Cobb is a 60 y.o. male with past medical history significant for arthrofibrosis, peptic ulcer disease who underwent revision of his left knee prosthesis 5 weeks ago with Dr. Alvan Dame.  He helped fairly extensive swelling in his knee by his account postoperatively.  He was seen several times by Dr. Ihor Gully and his office for recurrent swelling in the prepatellar area.  This area was aspirated on multiple occasions.  Patient then would experience recurrence of the fluid.  More recently he stated that the fluid stopped building up in his knee but he continued to have  ear pain in his knee joint and was unable to do a lot of the things that he typically does including playing music on stage or exercising.  He then began develop fevers that would come typically in the afternoon and then go away again.  The patella fluid was aspirated in the office by Dr. On and Staphylococcus lugdunensis was encountered.  Fortunately it is a methicillin  sensitive variety.  She was taken to the operating room yesterday and underwent excisional and nonexcisional debridement of his left knee with revision of the polyethylene and removal of old polyethylene replacement of a new polyethylene liner.  Cultures were taken in the operating room.  Patient is been now started on cefazolin.  I will plan on giving him 6 weeks of IV therapy followed by protracted oral therapy at minimum to get to 6 months but more likely to a year.  I will have him follow-up with my partner Dr. Juleen China in January since I do not have clinic that month.  I spent 82 minutes with the patient including than 50% of the time in face to face counseling of the patient during the nature of prosthetic joint infections the different organisms that can be involved including the Staphylococcus lugdunensis that he is dealing with the various surgical and medical approaches that can be taken to address these types of infections, reviewing how he would be taking antibiotics at home and following up with in the clinic, how we should monitor his pain and how we would monitor his inflammatory markers to assess response to therapy  personally reviewing plain films, updated cultures in the operating room CBC BMP along with review of medical records in preparation for the visit and during the visit and in coordination of his care.    Review of Systems: Review of Systems  Constitutional:  Positive for fever. Negative for chills, malaise/fatigue and weight loss.  HENT:  Negative for congestion and sore throat.   Eyes:  Negative for blurred vision and photophobia.  Respiratory:  Negative for cough, shortness of breath and wheezing.   Cardiovascular:  Negative for chest pain, palpitations and leg swelling.  Gastrointestinal:  Negative for abdominal pain, blood in stool, constipation, diarrhea, heartburn, melena, nausea and vomiting.  Genitourinary:  Negative for dysuria, flank pain and hematuria.   Musculoskeletal:  Positive for back pain, joint pain and myalgias. Negative for falls.  Skin:  Negative for itching and rash.  Neurological:  Negative for dizziness, focal weakness, loss of consciousness, weakness and headaches.  Endo/Heme/Allergies:  Does not bruise/bleed easily.  Psychiatric/Behavioral:  Negative for depression and suicidal ideas. The patient does not have insomnia.    Past Medical History:  Diagnosis Date   Arthritis    Back pain    radiates into the left posterior thigh to the calf.   GERD (gastroesophageal reflux disease)    History of kidney stones    Left shoulder pain    Leg pain    Worse than back pain   Lumbar foraminal stenosis    No pertinent past medical history    Radiculopathy, lumbar region     Social History   Tobacco Use   Smoking status: Never   Smokeless tobacco: Never  Vaping Use   Vaping Use: Never used  Substance Use Topics   Alcohol use: Yes    Comment: occassionally   Drug use: Never    History reviewed. No pertinent family history. No Known Allergies  OBJECTIVE: Blood pressure 132/84, pulse 63, temperature (!)  97.5 F (36.4 C), resp. rate 18, height 6' (1.829 m), weight 101 kg, SpO2 95 %.  Physical Exam Constitutional:      Appearance: He is well-developed.  HENT:     Head: Normocephalic and atraumatic.  Eyes:     Conjunctiva/sclera: Conjunctivae normal.  Cardiovascular:     Rate and Rhythm: Normal rate and regular rhythm.  Pulmonary:     Effort: Pulmonary effort is normal. No respiratory distress.     Breath sounds: No wheezing.  Abdominal:     General: There is no distension.     Palpations: Abdomen is soft.  Musculoskeletal:        General: No tenderness.     Cervical back: Normal range of motion and neck supple.  Skin:    General: Skin is warm and dry.     Coloration: Skin is not pale.     Findings: No erythema or rash.  Neurological:     General: No focal deficit present.     Mental Status: He is alert  and oriented to person, place, and time.  Psychiatric:        Mood and Affect: Mood normal.        Behavior: Behavior normal.        Thought Content: Thought content normal.        Judgment: Judgment normal.   Left knee with bandage  Lab Results Lab Results  Component Value Date   WBC 8.1 06/10/2021   HGB 12.1 (L) 06/10/2021   HCT 35.5 (L) 06/10/2021   MCV 89.9 06/10/2021   PLT 207 06/10/2021    Lab Results  Component Value Date   CREATININE 1.07 06/10/2021   BUN 24 (H) 06/10/2021   NA 137 06/10/2021   K 4.3 06/10/2021   CL 107 06/10/2021   CO2 22 06/10/2021    Lab Results  Component Value Date   ALT 21 04/16/2021   AST 23 04/16/2021   ALKPHOS 60 04/16/2021   BILITOT 2.1 (H) 04/16/2021     Microbiology: Recent Results (from the past 240 hour(s))  Surgical pcr screen     Status: None   Collection Time: 06/08/21 11:22 AM   Specimen: Nasal Mucosa; Nasal Swab  Result Value Ref Range Status   MRSA, PCR NEGATIVE NEGATIVE Final   Staphylococcus aureus NEGATIVE NEGATIVE Final    Comment: (NOTE) The Xpert SA Assay (FDA approved for NASAL specimens in patients 67 years of age and older), is one component of a comprehensive surveillance program. It is not intended to diagnose infection nor to guide or monitor treatment. Performed at [facility], [place] [address], [place], [place] [address]   SARS Coronavirus 2 by RT PCR (hospital order, performed in [facility] hospital lab) Nasopharyngeal Nasopharyngeal Swab     Status: None   Collection Time: 06/09/21  1:03 PM   Specimen: Nasopharyngeal Swab  Result Value Ref Range Status   SARS Coronavirus 2 NEGATIVE NEGATIVE Final    Comment: (NOTE) SARS-CoV-2 target nucleic acids are NOT DETECTED.  The SARS-CoV-2 RNA is generally detectable in upper and lower respiratory specimens during the acute phase of infection. The lowest concentration of SARS-CoV-2 viral copies this assay can detect is 250 copies / mL.  A negative result does not preclude SARS-CoV-2 infection and should not be used as the sole basis for treatment or other patient management decisions.  A negative result may occur with improper specimen collection / handling, submission of specimen other than nasopharyngeal swab, presence  of viral mutation(s) within the areas targeted by this assay, and inadequate number of viral copies (<250 copies / mL). A negative result must be combined with clinical observations, patient history, and epidemiological information.  Fact Sheet for Patients:   StrictlyIdeas.no  Fact Sheet for Healthcare Providers: BankingDealers.co.za  This test is not yet approved or  cleared by the [place] FDA and has been authorized for detection and/or diagnosis of SARS-CoV-2 by FDA under an Emergency Use Authorization (EUA).  This EUA will remain in effect (meaning this test can be used) for the duration of the COVID-19 declaration under Section 564(b)(1) of the Act, 21 U.S.C. section 360bbb-3(b)(1), unless the authorization is terminated or revoked sooner.  Performed at [facility], [place] [address] [place], [place] [address]   Aerobic/Anaerobic Culture w Gram Stain (surgical/deep wound)     Status: None (Preliminary result)   Collection Time: 06/09/21  5:01 PM   Specimen: PATH Cytology Misc. fluid; Body Fluid  Result Value Ref Range Status   Specimen Description   Final    KNEE LEFT PRE PATELLA SPACE Performed at [place] [address], [place], [place] [address]    Special Requests   Final    NONE Performed at [facility], [place] [address] [place], [place] [address]    Gram Stain   Final    NO SQUAMOUS EPITHELIAL CELLS SEEN MODERATE WBC SEEN NO ORGANISMS SEEN    Culture   Final    NO GROWTH < 24 HOURS Performed at [facility], [place] [address] [place], [place] [address]     Report Status PENDING  Incomplete  Aerobic/Anaerobic Culture w Gram Stain (surgical/deep wound)     Status: None (Preliminary result)   Collection Time: 06/09/21  5:07 PM   Specimen: PATH Cytology Misc. fluid; Body Fluid  Result Value Ref Range Status   Specimen Description   Final    KNEE LEFT INTRAARTICULAR FLUID Performed at [place] [address] [place], [place] [address]    Special Requests   Final    NONE Performed at [facility], [place] [address], [place], [place] [address]    Gram Stain   Final    NO SQUAMOUS EPITHELIAL CELLS SEEN MODERATE WBC SEEN NO ORGANISMS SEEN    Culture   Final    NO GROWTH < 24 HOURS Performed at [facility], [place] [address], [place], [place] [address]    Report Status PENDING  Incomplete    Alcide Evener, [place] [facility] [phone] pager  06/10/2021, 1:50 PM

## 2021-06-10 NOTE — Op Note (Signed)
NAME: Harry Cobb, SKALSKI MEDICAL RECORD NO: [id number] ACCOUNT NO: [id number] DATE OF BIRTH: 19-Sep-1960 FACILITY: [facility] LOCATION: [id number] PHYSICIAN: Pietro Cassis. Alvan Dame, MD  Operative Report   DATE OF PROCEDURE: 06/09/2021  PREOPERATIVE DIAGNOSIS:  Infected left total knee revision.  POSTOPERATIVE DIAGNOSIS:  Infected left total knee revision.  PROCEDURE: 1.  Excisional and non-excisional debridement of left knee utilizing the entire 8-inch incision, sharply excised with a combination of a scalpel and Bovie cautery including skin, nonviable subcutaneous tissue, old sutures as well as nonviable scar tissue  within the knee, followed by #2. 2.  Revision polyethylene with removal of old polyethylene, placement of new polyethylene, size 6 x 8 mm posterior stabilized insert. 3.  The non-excisional debridement was carried out with 6 liters of normal saline solution with sterile Betadine solution mixed with 500 mL of saline as well as 500 mL of IrriSept chlorhexidine-based fluid.  SURGEON:  Pietro Cassis. Alvan Dame, MD  ASSISTANT: Costella Hatcher, PA-C.  Note that Ms. Lu Duffel was present for the entirety of the case for preoperative positioning, perioperative management of the operative extremity, general facilitation of the case, and primary wound closure.  ANESTHESIA:  Regional plus spinal.  BLOOD LOSS:  Less than 200 mL.  DRAINS:  None.  SPECIMENS: I did take swabs of soft tissue in the prepatellar space as well as then fluid from the joint, sent to pathology.  We already had a staphylococcus species from an aspiration in the office.  TOURNIQUET: Was up for 31 minutes at 250 mmHg.  INDICATIONS:  Harry Cobb is a pleasant 60 year old male who is now about 5 weeks status post revision of his left knee related to complications of arthrofibrosis and progressive loss of motion and function.  He had been making very good progress in the  postoperative period with physical therapy; however, he had been seen in  the office for recurrent prepatellar swelling.  This required multiple aspirations.  As he was continued to have challenges with pain and the recurrent swelling, he did also report  low-grade fevers over the past week, we then sent off the prepatellar fluid, identifying infection within this fluid including the staphylococcus species.  Given these findings, we discussed the necessity and urgency to proceed and address this as an  acute infection.  Risks of recurrent infection, the challenges of managing infection as well as the postoperative course were all reviewed and consent was obtained for management of this.  PROCEDURE IN DETAIL:  The patient was brought to the operative theater.  Once adequate anesthesia, preoperative antibiotics, Ancef administered, he was positioned supine with a left thigh tourniquet placed.  The left lower extremity was then prepped and  draped in sterile fashion.  A timeout was performed identifying the patient, planned procedure, and extremity.  His old incision was excised sharply with a knife.  As we got into this prepatellar space, there was some seromatous based fluid, but not  nearly as much as what we had been drawn off recently in office.  There was this cobweb type appearance to the soft tissues in this area.  I did take culture swabs of this to send to pathology.  Once this was done, I irrigated this prepatellar space as  well as did a further debridement of the soft tissues in this area with a curette and rongeur.  We irrigated this area with about 2 liters of normal saline solution.  Once this was done, I made an arthrotomy through his old suture line, removing the  old  sutures.  We did encounter fluid in this area that did not have the significant findings for purulence, but rather was a blood-tinged synovial fluid.  I did send about 20 mL of this to pathology as a separate specimen for an intraarticular evaluation.   At this point, I exposed the knee, performing an  extensive debridement using the Bovie of the medial and suprapatellar region as well as in the parapatellar region.  We then removed the old polyethylene insert to allow for debridement of the soft tissues  posteriorly and around the tibial component.  Once the sharp debridement was carried out, we irrigated the knee with 4 liters normal saline solution. I then placed 500 mL of a sterile Betadine combination with normal saline and kept that in the knee for  2-3 minutes followed by the use of IrriSept chlorhexidine-based fluid.  Once this was done, we reirrigated the knee with another liter of normal saline solution.  We removed all of our old gloves and donned new gloves and then placed a new polyethylene  insert into the knee.  At this point, the extensor mechanism was reapproximated using #1 Vicryl and #1 Stratafix suture.  I placed 2 grams of vancomycin powder into the intra-articular and subcutaneous space.  Based on this, I elected not to put a  Hemovac drain in despite his postoperative seroma at the index procedure.  The remainder of the wound was closed with 2-0 Vicryl and a running Monocryl stitch.  The knee was then cleaned, dried and dressed sterilely using surgical glue and Aquacel  dressing.  He was then brought to the recovery room in stable condition, tolerating the procedure well.  He will be admitted to the hospital.  We will place a PICC line.  Infectious disease will be consulted.  The staphylococcus species that grew out of his knee appears to be sensitive to all antibiotics, thus the selection for antibiotic choice may be  improved for his lifestyle; however, we will let the infectious disease manage this.  Otherwise, we will continue to work aggressively on maximizing his motion as this is his primary goal from his procedure.   VAI D: 06/09/2021 6:09:45 pm T: 06/10/2021 2:13:00 am  JOB: [id number]/ [id number]

## 2021-06-10 NOTE — Evaluation (Signed)
Physical Therapy Evaluation Patient Details Name: Harry Cobb MRN: [id number] DOB: 06-29-1960 Today's Date: 06/10/2021  History of Present Illness  60 y.o. 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 PMH: L TKA revision 04/28/21, lumbar fusion, L TKA.  Clinical Impression  Pt is mobilizing well, he ambulated 350' with RW, no loss of balance. He declined stair training as he recalls technique from recent hospitalization. He reports understanding of HEP. He is ready to DC home from a PT standpoint.    Recommendations for follow up therapy are one component of a multi-disciplinary discharge planning process, led by the attending physician.  Recommendations may be updated based on patient status, additional functional criteria and insurance authorization.  Follow Up Recommendations Outpatient PT    Assistance Recommended at Discharge Set up Supervision/Assistance  Functional Status Assessment Patient has had a recent decline in their functional status and demonstrates the ability to make significant improvements in function in a reasonable and predictable amount of time.  Equipment Recommendations  None recommended by PT    Recommendations for Other Services       Precautions / Restrictions Precautions Precautions: Knee Precaution Booklet Issued: Yes (comment) Precaution Comments: reviewed no pillow under knee Restrictions Weight Bearing Restrictions: No LLE Weight Bearing: Weight bearing as tolerated      Mobility  Bed Mobility Overal bed mobility: Independent                  Transfers Overall transfer level: Modified independent                      Ambulation/Gait Ambulation/Gait assistance: Modified independent (Device/Increase time) Gait Distance (Feet): 350 Feet Assistive device: Rolling walker (2 wheels) Gait Pattern/deviations: Step-through pattern;Decreased step length - right;Decreased step length - left Gait  velocity: WFL     General Gait Details: steady with RW, no loss of balance  Stairs            Wheelchair Mobility    Modified Rankin (Stroke Patients Only)       Balance Overall balance assessment: Modified Independent                                           Pertinent Vitals/Pain Pain Score: 3  Pain Location: L knee Pain Descriptors / Indicators: Aching Pain Intervention(s): Limited activity within patient's tolerance;Monitored during session;Premedicated before session;Ice applied    Home Living Family/patient expects to be discharged to:: Private residence Living Arrangements: Spouse/significant other Available Help at Discharge: Family Type of Home: House Home Access: Stairs to enter   [employer] of Steps: 2 front; 6 back (has rail) Alternate Level Stairs-Number of Steps: Flight Home Layout: Two level;Bed/bath upstairs Home Equipment: Shower seat - built in      Prior Function Prior Level of Function : Independent/Modified Independent             Mobility Comments: was playing pickle ball and golf at baseline       Hand Dominance   Dominant Hand: Right    Extremity/Trunk Assessment   Upper Extremity Assessment Upper Extremity Assessment: Overall WFL for tasks assessed    Lower Extremity Assessment Lower Extremity Assessment: LLE deficits/detail LLE Deficits / Details: 5-75* AAROM L knee; +4/5 knee ext, + SLR LLE Sensation: WNL    Cervical / Trunk Assessment Cervical / Trunk  Assessment: Normal  Communication   Communication: No difficulties  Cognition Arousal/Alertness: Awake/alert Behavior During Therapy: WFL for tasks assessed/performed Overall Cognitive Status: Within Functional Limits for tasks assessed                                          General Comments      Exercises Total Joint Exercises Ankle Circles/Pumps: AROM;Both;10 reps Long Arc Quad: AROM;Left;10  reps;Seated Knee Flexion: AAROM;Left;10 reps;Seated Goniometric ROM: 5-75* AAROM L knee   Assessment/Plan    PT Assessment All further PT needs can be met in the next venue of care  PT Problem List Decreased range of motion;Pain       PT Treatment Interventions      PT Goals (Current goals can be found in the Care Plan section)  Acute Rehab PT Goals Patient Stated Goal: return to pickleball, golf PT Goal Formulation: All assessment and education complete, DC therapy    Frequency     Barriers to discharge        Co-evaluation               AM-PAC PT "6 Clicks" Mobility  Outcome Measure Help needed turning from your back to your side while in a flat bed without using bedrails?: None Help needed moving from lying on your back to sitting on the side of a flat bed without using bedrails?: None Help needed moving to and from a bed to a chair (including a wheelchair)?: None Help needed standing up from a chair using your arms (e.g., wheelchair or bedside chair)?: None Help needed to walk in hospital room?: None Help needed climbing 3-5 steps with a railing? : None 6 Click Score: 24    End of Session Equipment Utilized During Treatment: Gait belt Activity Tolerance: Patient tolerated treatment well Patient left: in bed;with call bell/phone within reach Nurse Communication: Mobility status PT Visit Diagnosis: Pain;Other abnormalities of gait and mobility (R26.89) Pain - Right/Left: Left Pain - part of body: Knee    Time: 5830-7460 PT Time Calculation (min) (ACUTE ONLY): 18 min   Charges:   PT Evaluation $PT Eval Moderate Complexity: 1 Mod         Philomena Doheny PT 06/10/2021  Acute Rehabilitation Services Pager [phone] Office [phone]

## 2021-06-10 NOTE — Progress Notes (Signed)
PHARMACY CONSULT NOTE FOR:  OUTPATIENT  PARENTERAL ANTIBIOTIC THERAPY (OPAT)  Indication: Staph lugdenensis PJI  Regimen: Cefazolin 2 gm every 8 hours End date: 07/22/21  IV antibiotic discharge orders are pended. To discharging provider:  please sign these orders via discharge navigator,  Select New Orders & click on the button choice - Manage This Unsigned Work.     Thank you for allowing pharmacy to be a part of this patient's care.  Jimmy Footman, PharmD, BCPS, BCIDP Infectious Diseases Clinical Pharmacist Phone: [phone] 06/10/2021, 11:17 AM

## 2021-06-10 NOTE — TOC Transition Note (Signed)
Transition of Care [facility]) - CM/SW Discharge Note  Patient Details  Name: Harry Cobb MRN: [id number] Date of Birth: 10/21/1960  [facility] CM/SW Contact:  Sherie Don, LCSW Phone Number: 06/10/2021, 1:28 PM  Clinical Narrative: Patient will need to discharge home with IV antibiotics and HHRN. CSW made referral to [facility] with [facility] for IV antibiotics and she set up patient with [facility] for [facility]. OPAT orders have been signed. TOC signing off.  Final next level of care: Home w Home Health Services Barriers to Discharge: No Barriers Identified  Patient Goals and CMS Choice CMS Medicare.gov Compare Post Acute Care list provided to:: Patient Choice offered to / list presented to : Patient  Discharge Plan and Services HH Arranged: IV Antibiotics, RN [place] Agency: [facility], Other - See comment ([facility] to provide RN services)  Readmission Risk Interventions No flowsheet data found.

## 2021-06-11 DIAGNOSIS — T8454XA Infection and inflammatory reaction due to internal left knee prosthesis, initial encounter: Secondary | ICD-10-CM | POA: Diagnosis not present

## 2021-06-11 LAB — HCV AB W REFLEX TO QUANT PCR: HCV Ab: <0.1 s/co ratio (ref 0.0–0.9)

## 2021-06-11 LAB — HEPATITIS B SURFACE ANTIBODY, QUANTITATIVE: Hep B S AB Quant (Post): <3.1 m[IU]/mL — ABNORMAL LOW (ref >9.9)

## 2021-06-11 LAB — HCV INTERPRETATION

## 2021-06-12 DIAGNOSIS — M25662 Stiffness of left knee, not elsewhere classified: Secondary | ICD-10-CM | POA: Diagnosis not present

## 2021-06-12 DIAGNOSIS — T8454XA Infection and inflammatory reaction due to internal left knee prosthesis, initial encounter: Secondary | ICD-10-CM | POA: Diagnosis not present

## 2021-06-12 NOTE — Discharge Summary (Signed)
Physician Discharge Summary   Patient ID: NASIIR MONTS MRN: [id number] DOB/AGE: May 27, 1961 60 y.o.  Admit date: 06/09/2021 Discharge date: 06/10/2021  Primary Diagnosis: Infected left total knee revision.  Admission Diagnoses:  Past Medical History:  Diagnosis Date   Arthritis    Back pain    radiates into the left posterior thigh to the calf.   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   Weight as of this encounter: 101 kg.  Procedure:  Procedure(s) (LRB): IRRIGATION AND DEBRIDEMENT KNEE WITH POLY REVISION (Left)   Consults: ID  HPI: Mr. Hayashi is a pleasant 60 year old male who is now about 5 weeks status post revision of his left knee related to complications of arthrofibrosis and progressive loss of motion and function.  He had been making very good progress in the  postoperative period with physical therapy; however, he had been seen in the office for recurrent prepatellar swelling.  This required multiple aspirations.  As he was continued to have challenges with pain and the recurrent swelling, he did also report  low-grade fevers over the past week, we then sent off the prepatellar fluid, identifying infection within this fluid including the staphylococcus species.  Given these findings, we discussed the necessity and urgency to proceed and address this as an  acute infection.  Risks of recurrent infection, the challenges of managing infection as well as the postoperative course were all reviewed and consent  was obtained for management of this.  Laboratory Data: Admission on 06/09/2021, Discharged on 06/10/2021  Component Date Value Ref Range Status   SARS Coronavirus 2 06/09/2021 NEGATIVE  NEGATIVE Final   Comment: (NOTE) SARS-CoV-2 target nucleic acids are NOT DETECTED.  The SARS-CoV-2 RNA is generally detectable in upper and lower respiratory specimens during the acute phase of infection. The lowest concentration of SARS-CoV-2 viral copies this assay can detect is 250 copies / mL. A negative result does not preclude SARS-CoV-2 infection and should not be used as the sole basis for treatment or other patient management decisions.  A negative result may occur with improper specimen collection / handling, submission of specimen other than nasopharyngeal swab, presence of viral mutation(s) within the areas targeted by this assay, and inadequate number of viral copies (<250 copies / mL). A negative result must be combined with clinical observations, patient history, and epidemiological information.  Fact Sheet for Patients:   StrictlyIdeas.no  Fact Sheet for Healthcare Providers: BankingDealers.co.za  This test is not yet approved or                           cleared by the [place] FDA and has been authorized for detection and/or diagnosis of SARS-CoV-2 by FDA under an Emergency Use Authorization (EUA).  This EUA will remain in effect (meaning this test can be used) for the duration of the COVID-19 declaration under Section 564(b)(1) of the Act, 21 U.S.C. section 360bbb-3(b)(1), unless  the authorization is terminated or revoked sooner.  Performed at [facility], [place] [address] [place], [place] [address]    Specimen Description 06/09/2021    Final                   Value:KNEE LEFT PRE PATELLA SPACE Performed at [facility], [place] [address] [place], [place] [address]    Special Requests  06/09/2021    Final                   Value:NONE Performed at [facility], [place] [address] [place], [place] [address]    Gram Stain 06/09/2021    Final                   Value:NO SQUAMOUS EPITHELIAL CELLS SEEN MODERATE WBC SEEN NO ORGANISMS SEEN    Culture 06/09/2021    Final                   Value:NO GROWTH 2 DAYS Performed at [facility], [place] [address], [place], [place] [address]    Report Status 06/09/2021 PENDING   Incomplete   Specimen Description 06/09/2021    Final                   Value:KNEE LEFT INTRAARTICULAR FLUID Performed at [facility], [place] [address] [place], [place] [address]    Special Requests 06/09/2021    Final                   Value:NONE Performed at [facility], [place] [address], [place], [place] [address]    Gram Stain 06/09/2021    Final                   Value:NO SQUAMOUS EPITHELIAL CELLS SEEN MODERATE WBC SEEN NO ORGANISMS SEEN    Culture 06/09/2021    Final                   Value:NO GROWTH 2 DAYS Performed at [facility], [place] [address] [place], [place] [address]    Report Status 06/09/2021 PENDING   Incomplete   WBC 06/10/2021 8.1  4.0 - 10.5 K/uL Final   RBC 06/10/2021 3.95 (L)  4.22 - 5.81 MIL/uL Final   Hemoglobin 06/10/2021 12.1 (L)  13.0 - 17.0 g/dL Final   HCT 06/10/2021 35.5 (L)  39.0 - 52.0 % Final   MCV 06/10/2021 89.9  80.0 - 100.0 fL Final   MCH 06/10/2021 30.6  26.0 - 34.0 pg Final   MCHC 06/10/2021 34.1  30.0 - 36.0 g/dL Final   RDW 06/10/2021 12.2  11.5 - 15.5 % Final   Platelets 06/10/2021 207  150 - 400 K/uL Final   nRBC 06/10/2021 0.0  0.0 - 0.2 % Final   Performed at [facility], [place] [address], [place], [place] [address]   Sodium 06/10/2021 137  135 - 145 mmol/L Final   Potassium 06/10/2021 4.3  3.5 - 5.1 mmol/L Final   Chloride 06/10/2021 107  98 - 111 mmol/L Final   CO2 06/10/2021 22  22 - 32 mmol/L Final   Glucose, Bld  06/10/2021 169 (H)  70 - 99 mg/dL Final   Glucose reference range applies only to samples taken after fasting for at least 8 hours.   BUN 06/10/2021 24 (H)  6 - 20 mg/dL Final   Creatinine,  Ser 06/10/2021 1.07  0.61 - 1.24 mg/dL Final   Calcium 06/10/2021 8.7 (L)  8.9 - 10.3 mg/dL Final   GFR, Estimated 06/10/2021 >60  >60 mL/min Final   Comment: (NOTE) Calculated using the CKD-EPI Creatinine Equation (2021)    Anion gap 06/10/2021 8  5 - 15 Final   Performed at [facility], [place] [address], [place], [place] [address]   HIV Screen 4th Generation wRfx 06/10/2021 Non Reactive  Non Reactive Final   Performed at [facility], [place] [address] [place], [place] [address]   HCV Ab 06/10/2021 <0.1  0.0 - 0.9 s/co ratio Final   Comment: (NOTE) Performed At: [facility] [place], [place] [id number] Rush Farmer MD OI:[id number]    hep A Total Ab 06/10/2021 Reactive (A)  NON REACTIVE Final   Performed at [facility], [place] [address], [place], [place] [address]   Sed Rate 06/10/2021 30 (H)  0 - 16 mm/hr Final   Performed at [facility], [place] [address] [place], [place] [address]   CRP 06/10/2021 1.0 (H)  <1.0 mg/dL Final   Performed at [place] [address] [place], [place] [address]   Hepatitis B-Post 06/10/2021 <3.1 (L)  Immunity>9.9 mIU/mL Final   Comment: (NOTE)  Status of Immunity                     Anti-HBs Level  ------------------                     -------------- Inconsistent with Immunity                   0.0 - 9.9 Consistent with Immunity                          >9.9 Performed At: [facility] [place], [place] [id number] Rush Farmer MD KC:[id number]    HCV Interp 1: 06/10/2021 Comment   Final   Comment: (NOTE) Negative Not infected with HCV, unless recent infection is suspected or other evidence exists to indicate HCV infection. Performed At: [facility] [place], [place] [id number] [place] FM:[id number]   Hospital Outpatient Visit on 06/08/2021  Component Date Value Ref Range Status   WBC 06/08/2021 5.0  4.0 - 10.5 K/uL Final   RBC 06/08/2021 4.24  4.22 - 5.81 MIL/uL Final   Hemoglobin 06/08/2021 13.3  13.0 - 17.0 g/dL Final   HCT 06/08/2021 38.6 (L)  39.0 - 52.0 % Final   MCV 06/08/2021 91.0  80.0 - 100.0 fL Final   MCH 06/08/2021 31.4  26.0 - 34.0 pg Final   MCHC 06/08/2021 34.5  30.0 - 36.0 g/dL Final   RDW 06/08/2021 12.7  11.5 - 15.5 % Final   Platelets 06/08/2021 207  150 - 400 K/uL Final   nRBC 06/08/2021 0.0  0.0 - 0.2 % Final   Performed at [facility], [place] [address] [place], [place] [address]   Sodium 06/08/2021 138  135 - 145 mmol/L Final   Potassium 06/08/2021 3.8  3.5 - 5.1 mmol/L Final   Chloride 06/08/2021 107  98 - 111 mmol/L Final   CO2 06/08/2021 22  22 - 32 mmol/L Final   Glucose, Bld 06/08/2021 110 (H)  70 - 99 mg/dL Final   Glucose reference range applies only to samples taken  after fasting for at least 8 hours.   BUN 06/08/2021 22 (H)  6 - 20 mg/dL Final   Creatinine, Ser 06/08/2021 0.87  0.61 - 1.24 mg/dL Final   Calcium 06/08/2021 8.9  8.9 - 10.3 mg/dL Final   GFR, Estimated 06/08/2021 >60  >60 mL/min Final   Comment: (NOTE) Calculated using the CKD-EPI Creatinine Equation (2021)    Anion gap 06/08/2021 9  5 - 15 Final   Performed at [facility], [place] [address] [place], [place] [address]   ABO/RH(D) 06/08/2021 O NEG   Final   Antibody Screen 06/08/2021 NEG   Final   Sample Expiration 06/08/2021    Final                   Value:06/11/2021,2359 Performed at [facility], [place] [address], [place], [place] [address]    MRSA, PCR 06/08/2021 NEGATIVE  NEGATIVE Final   Staphylococcus aureus 06/08/2021 NEGATIVE  NEGATIVE Final   Comment: (NOTE) The Xpert SA Assay (FDA approved for NASAL specimens in patients  14 years of age and older), is one component of a comprehensive surveillance program. It is not intended to diagnose infection nor to guide or monitor treatment. Performed at [facility], [place] [address], [place], [place] [address]   Admission on 04/28/2021, Discharged on 04/29/2021  Component Date Value Ref Range Status   SARS Coronavirus 2 04/28/2021 NEGATIVE  NEGATIVE Final   Comment: (NOTE) SARS-CoV-2 target nucleic acids are NOT DETECTED.  The SARS-CoV-2 RNA is generally detectable in upper and lower respiratory specimens during the acute phase of infection. The lowest concentration of SARS-CoV-2 viral copies this assay can detect is 250 copies / mL. A negative result does not preclude SARS-CoV-2 infection and should not be used as the sole basis for treatment or other patient management decisions.  A negative result may occur with improper specimen collection / handling, submission of specimen other than nasopharyngeal swab, presence of viral mutation(s) within the areas targeted by this assay, and inadequate number of viral copies (<250 copies / mL). A negative result must be combined with clinical observations, patient history, and epidemiological information.  Fact Sheet for Patients:   StrictlyIdeas.no  Fact Sheet for Healthcare Providers: BankingDealers.co.za  This test is not yet approved or                           cleared by the [place] FDA and has been authorized for detection and/or diagnosis of SARS-CoV-2 by FDA under an Emergency Use Authorization (EUA).  This EUA will remain in effect (meaning this test can be used) for the duration of the COVID-19 declaration under Section 564(b)(1) of the Act, 21 U.S.C. section 360bbb-3(b)(1), unless the authorization is terminated or revoked sooner.  Performed at [facility], [place] [address] [place], [place] [address]    WBC  04/29/2021 11.0 (H)  4.0 - 10.5 K/uL Final   RBC 04/29/2021 4.05 (L)  4.22 - 5.81 MIL/uL Final   Hemoglobin 04/29/2021 12.8 (L)  13.0 - 17.0 g/dL Final   HCT 04/29/2021 37.2 (L)  39.0 - 52.0 % Final   MCV 04/29/2021 91.9  80.0 - 100.0 fL Final   MCH 04/29/2021 31.6  26.0 - 34.0 pg Final   MCHC 04/29/2021 34.4  30.0 - 36.0 g/dL Final   RDW 04/29/2021 12.8  11.5 - 15.5 % Final   Platelets 04/29/2021 161  150 -  400 K/uL Final   nRBC 04/29/2021 0.0  0.0 - 0.2 % Final   Performed at [facility], [place] [address], [place], [place] [address]   Sodium 04/29/2021 138  135 - 145 mmol/L Final   Potassium 04/29/2021 4.1  3.5 - 5.1 mmol/L Final   Chloride 04/29/2021 104  98 - 111 mmol/L Final   CO2 04/29/2021 25  22 - 32 mmol/L Final   Glucose, Bld 04/29/2021 173 (H)  70 - 99 mg/dL Final   Glucose reference range applies only to samples taken after fasting for at least 8 hours.   BUN 04/29/2021 23 (H)  6 - 20 mg/dL Final   Creatinine, Ser 04/29/2021 0.92  0.61 - 1.24 mg/dL Final   Calcium 04/29/2021 8.4 (L)  8.9 - 10.3 mg/dL Final   GFR, Estimated 04/29/2021 >60  >60 mL/min Final   Comment: (NOTE) Calculated using the CKD-EPI Creatinine Equation (2021)    Anion gap 04/29/2021 9  5 - 15 Final   Performed at [facility], [place] [address], [place], [place] [address]  Hospital Outpatient Visit on 04/16/2021  Component Date Value Ref Range Status   MRSA, PCR 04/16/2021 NEGATIVE  NEGATIVE Final   Staphylococcus aureus 04/16/2021 POSITIVE (A)  NEGATIVE Final   Comment: (NOTE) The Xpert SA Assay (FDA approved for NASAL specimens in patients 47 years of age and older), is one component of a comprehensive surveillance program. It is not intended to diagnose infection nor to guide or monitor treatment. Performed at [facility], [place] [address] [place], [place] [address]    WBC 04/16/2021 5.1  4.0 - 10.5 K/uL Final   RBC 04/16/2021 4.86  4.22 -  5.81 MIL/uL Final   Hemoglobin 04/16/2021 15.2  13.0 - 17.0 g/dL Final   HCT 04/16/2021 44.6  39.0 - 52.0 % Final   MCV 04/16/2021 91.8  80.0 - 100.0 fL Final   MCH 04/16/2021 31.3  26.0 - 34.0 pg Final   MCHC 04/16/2021 34.1  30.0 - 36.0 g/dL Final   RDW 04/16/2021 12.8  11.5 - 15.5 % Final   Platelets 04/16/2021 150  150 - 400 K/uL Final   nRBC 04/16/2021 0.0  0.0 - 0.2 % Final   Performed at [facility], [place] [address] [place], [place] [address]   Sodium 04/16/2021 136  135 - 145 mmol/L Final   Potassium 04/16/2021 3.8  3.5 - 5.1 mmol/L Final   Chloride 04/16/2021 108  98 - 111 mmol/L Final   CO2 04/16/2021 23  22 - 32 mmol/L Final   Glucose, Bld 04/16/2021 90  70 - 99 mg/dL Final   Glucose reference range applies only to samples taken after fasting for at least 8 hours.   BUN 04/16/2021 23 (H)  6 - 20 mg/dL Final   Creatinine, Ser 04/16/2021 1.04  0.61 - 1.24 mg/dL Final   Calcium 04/16/2021 8.8 (L)  8.9 - 10.3 mg/dL Final   Total Protein 04/16/2021 7.2  6.5 - 8.1 g/dL Final   Albumin 04/16/2021 4.1  3.5 - 5.0 g/dL Final   AST 04/16/2021 23  15 - 41 U/L Final   ALT 04/16/2021 21  0 - 44 U/L Final   Alkaline Phosphatase 04/16/2021 60  38 - 126 U/L Final   Total Bilirubin 04/16/2021 2.1 (H)  0.3 - 1.2 mg/dL Final   GFR, Estimated 04/16/2021 >60  >60 mL/min Final   Comment: (NOTE) Calculated using the CKD-EPI Creatinine Equation (  2021)    Anion gap 04/16/2021 5  5 - 15 Final   Performed at [facility], [place] [address] [place], [place] [address]   ABO/RH(D) 04/16/2021 O NEG   Final   Antibody Screen 04/16/2021 NEG   Final   Sample Expiration 04/16/2021 04/30/2021,2359   Final   Extend sample reason 04/16/2021    Final                   Value:NO TRANSFUSIONS OR PREGNANCY IN THE PAST 3 MONTHS Performed at [place] [address] [place], [place] [address]      X-Rays:US EKG SITE RITE  Result Date: 06/09/2021 If  [facility] image not attached, placement could not be confirmed due to current cardiac rhythm.   EKG: Orders placed or performed during the hospital encounter of 06/19/20   EKG test   EKG test     Hospital Course: DAREK EIFLER is a 60 y.o. who was admitted to [facility]. They were brought to the operating room on 06/09/2021 and underwent Procedure(s): Luis Llorens Torres.  Patient tolerated the procedure well and was later transferred to the recovery room and then to the orthopaedic floor for postoperative care. They were given PO and IV analgesics for pain control following their surgery. They were given 24 hours of postoperative antibiotics of  Anti-infectives (From admission, onward)    Start     Dose/Rate Route Frequency Ordered Stop   06/10/21 1400  ceFAZolin (ANCEF) IVPB 2g/100 mL premix  Status:  Discontinued        2 g 200 mL/hr over 30 Minutes Intravenous Every 8 hours 06/10/21 1024 06/10/21 1930   06/10/21 0600  ceFAZolin (ANCEF) IVPB 3g/100 mL premix  Status:  Discontinued        3 g 200 mL/hr over 30 Minutes Intravenous On call to O.R. 06/09/21 1259 06/09/21 1303   06/10/21 0000  ceFAZolin (ANCEF) IVPB        2 g Intravenous Every 8 hours 06/10/21 1231 07/22/21 2359   06/10/21 0000  ceFAZolin (ANCEF) IVPB        2 g Intravenous Every 8 hours 06/10/21 1231 07/22/21 2359   06/09/21 2230  ceFAZolin (ANCEF) IVPB 2g/100 mL premix        2 g 200 mL/hr over 30 Minutes Intravenous Every 6 hours 06/09/21 1920 06/10/21 0456   06/09/21 1715  vancomycin (VANCOCIN) powder  Status:  Discontinued          As needed 06/09/21 1715 06/09/21 1903   06/09/21 1500  ceFAZolin (ANCEF) IVPB 2g/100 mL premix        2 g 200 mL/hr over 30 Minutes Intravenous On call to O.R. 06/09/21 1259 06/09/21 1645      and started on DVT prophylaxis in the form of Aspirin.   PT and OT were ordered for total joint protocol. Discharge planning consulted to help with postop  disposition and equipment needs.  Patient had a good night on the evening of surgery. They started to get up OOB with therapy on POD #1. Pt was seen during rounds and was ready to go home pending progress with therapy. Infectious disease was consulted and recommended Ancef IV for 6 weeks. PICC line was placed. He worked with therapy on POD #1 and was meeting his goals. Pt was discharged to home later that day in stable condition.  Diet: Regular diet Activity: WBAT Follow-up: in 2 weeks  Disposition: Home Discharged Condition: good   Discharge Instructions     [facility] pharmacist to adjust dose for Vancomycin, Aminoglycosides and other anti-infective therapies as requested by physician.   Complete by: As directed    [facility] pharmacist to adjust dose for Vancomycin, Aminoglycosides and other anti-infective therapies as requested by physician.   Complete by: As directed    [facility] to provide Cath Flo 8m   Complete by: As directed    Administer for PICC line occlusion and as ordered by physician for other access device issues.   [facility] to provide Cath Flo 217m  Complete by: As directed    Administer for PICC line occlusion and as ordered by physician for other access device issues.   Anaphylaxis Kit: Provided to treat any anaphylactic reaction to the medication being provided to the patient if First Dose or when requested by physician   Complete by: As directed    Epinephrine 19m30ml vial / amp: Administer 0.68mg55m.68ml)60mbcutaneously once for moderate to severe anaphylaxis, nurse to call physician and pharmacy when reaction occurs and call 911 if needed for immediate care   Diphenhydramine 50mg/56mV vial: Administer 25-50mg I58m PRN for first dose reaction, rash, itching, mild reaction, nurse to call physician and pharmacy when reaction occurs   Sodium Chloride 0.9% NS 500ml IV168mminister if needed for hypovolemic blood pressure drop  or as ordered by physician after call to physician with anaphylactic reaction   Anaphylaxis Kit: Provided to treat any anaphylactic reaction to the medication being provided to the patient if First Dose or when requested by physician   Complete by: As directed    Epinephrine 19mg/ml v55m / amp: Administer 0.68mg (0.68m368msubcu37meously once for moderate to severe anaphylaxis, nurse to call physician and pharmacy when reaction occurs and call 911 if needed for immediate care   Diphenhydramine 50mg/ml IV 568m: Administer 25-50mg IV/IM P27mor first dose reaction, rash, itching, mild reaction, nurse to call physician and pharmacy when reaction occurs   Sodium Chloride 0.9% NS 500ml IV: Admi50mer if needed for hypovolemic blood pressure drop or as ordered by physician after call to physician with anaphylactic reaction   Call MD / Call 911   Complete by: As directed    If you experience chest pain or shortness of breath, CALL 911 and be transported to the hospital emergency room.  If you develope a fever above 101 F, pus (white drainage) or increased drainage or redness at the wound, or calf pain, call your surgeon's office.   Change dressing   Complete by: As directed    Maintain surgical dressing until follow up in the clinic. If the edges start to pull up, may reinforce with tape. If the dressing is no longer working, may remove and cover with gauze and tape, but must keep the area dry and clean.  Call with any questions or concerns.   Change dressing on IV access line weekly and PRN   Complete by: As directed    Change dressing on IV access line weekly and PRN   Complete by: As directed    Constipation Prevention   Complete by: As directed    Drink plenty of fluids.  Prune juice may be helpful.  You may use a stool softener, such as Colace (over the counter) 100 mg twice a day.  Use MiraLax (over the counter) for constipation as needed.   Diet - low sodium heart healthy  Complete by: As directed     Flush IV access with Sodium Chloride 0.9% and Heparin 10 units/ml or 100 units/ml   Complete by: As directed    Flush IV access with Sodium Chloride 0.9% and Heparin 10 units/ml or 100 units/ml   Complete by: As directed    Home infusion instructions - [facility]   Complete by: As directed    Instructions: Flush IV access with Sodium Chloride 0.9% and Heparin 10units/ml or 100units/ml   Change dressing on IV access line: Weekly and PRN   Instructions Cath Flo 89m: Administer for PICC Line occlusion and as ordered by physician for other access device   [facility] pharmacist to adjust dose for: Vancomycin, Aminoglycosides and other anti-infective therapies as requested by physician   Home infusion instructions - [facility]   Complete by: As directed    Instructions: Flush IV access with Sodium Chloride 0.9% and Heparin 10units/ml or 100units/ml   Change dressing on IV access line: Weekly and PRN   Instructions Cath Flo 252m Administer for PICC Line occlusion and as ordered by physician for other access device   [facility] pharmacist to adjust dose for: Vancomycin, Aminoglycosides and other anti-infective therapies as requested by physician   Increase activity slowly as tolerated   Complete by: As directed    Weight bearing as tolerated with assist device (walker, cane, etc) as directed, use it as long as suggested by your surgeon or therapist, typically at least 4-6 weeks.   Method of administration may be changed at the discretion of home infusion pharmacist based upon assessment of the patient and/or caregivers ability to self-administer the medication ordered   Complete by: As directed    Method of administration may be changed at the discretion of home infusion pharmacist based upon assessment of the patient and/or caregivers ability to self-administer the medication ordered   Complete by: As directed    Post-operative opioid taper  instructions:   Complete by: As directed    POST-OPERATIVE OPIOID TAPER INSTRUCTIONS: It is important to wean off of your opioid medication as soon as possible. If you do not need pain medication after your surgery it is ok to stop day one. Opioids include: Codeine, Hydrocodone(Norco, Vicodin), Oxycodone(Percocet, oxycontin) and hydromorphone amongst others.  Long term and even short term use of opiods can cause: Increased pain response Dependence Constipation Depression Respiratory depression And more.  Withdrawal symptoms can include Flu like symptoms Nausea, vomiting And more Techniques to manage these symptoms Hydrate well Eat regular healthy meals Stay active Use relaxation techniques(deep breathing, meditating, yoga) Do Not substitute Alcohol to help with tapering If you have been on opioids for less than two weeks and do not have pain than it is ok to stop all together.  Plan to wean off of opioids This plan should start within one week post op of your joint replacement. Maintain the same interval or time between taking each dose and first decrease the dose.  Cut the total daily intake of opioids by one tablet each day Next start to increase the time between doses. The last dose that should be eliminated is the evening dose.      TED hose   Complete by: As directed    Use stockings (TED hose) for 2 weeks on both leg(s).  You may remove them at night for sleeping.      Allergies as of 06/10/2021   No Known Allergies  Medication List     STOP taking these medications    diclofenac 75 MG EC tablet Commonly known as: VOLTAREN   doxycycline 100 MG tablet Commonly known as: VIBRA-TABS       TAKE these medications    acetaminophen 325 MG tablet Commonly known as: TYLENOL Take 1-2 tablets (325-650 mg total) by mouth every 6 (six) hours as needed for mild pain (pain score 1-3 or temp > 100.5).   aspirin 81 MG chewable tablet Chew 1 tablet (81 mg  total) by mouth 2 (two) times daily for 28 days.   ceFAZolin  IVPB Commonly known as: ANCEF Inject 2 g into the vein every 8 (eight) hours. Indication:  Staph lugdenensis prosthetic joint infection First Dose: Yes Last Day of Therapy:  07/22/2021 Labs - Once weekly:  CBC/D and BMP, Labs - Every other week:  ESR and CRP Method of administration: IV Push Method of administration may be changed at the discretion of home infusion pharmacist based upon assessment of the patient and/or caregiver's ability to self-administer the medication ordered.   ceFAZolin  IVPB Commonly known as: ANCEF Inject 2 g into the vein every 8 (eight) hours. Indication:  Staph lugdenensis PJI First Dose: Yes Last Day of Therapy:  07/22/21 Labs - Once weekly:  CBC/D and BMP, Labs - Every other week:  ESR and CRP Method of administration: IV Push Method of administration may be changed at the discretion of home infusion pharmacist based upon assessment of the patient and/or caregiver's ability to self-administer the medication ordered.   celecoxib 200 MG capsule Commonly known as: CELEBREX Take 1 capsule (200 mg total) by mouth 2 (two) times daily.   docusate sodium 100 MG capsule Commonly known as: COLACE Take 1 capsule (100 mg total) by mouth 2 (two) times daily.   fexofenadine 180 MG tablet Commonly known as: ALLEGRA Take 180 mg by mouth daily.   methocarbamol 500 MG tablet Commonly known as: ROBAXIN Take 1 tablet (500 mg total) by mouth every 6 (six) hours as needed for muscle spasms.   oxyCODONE 5 MG immediate release tablet Commonly known as: Oxy IR/ROXICODONE Take 1-2 tablets (5-10 mg total) by mouth every 4 (four) hours as needed for severe pain. What changed: reasons to take this   polyethylene glycol 17 g packet Commonly known as: MIRALAX / GLYCOLAX Take 17 g by mouth daily as needed for mild constipation.   tranexamic acid 650 MG Tabs tablet Commonly known as: LYSTEDA Take 3 tablets (1,950  mg total) by mouth daily for 7 days.   traZODone 50 MG tablet Commonly known as: DESYREL Take 1 tablet (50 mg total) by mouth at bedtime. Take 1 tablet at bedtime as needed for sleep               Discharge Care Instructions  (From admission, onward)           Start     Ordered   06/10/21 0000  Change dressing on IV access line weekly and PRN  (Home infusion instructions - [facility] )        06/10/21 1231   06/10/21 0000  Change dressing on IV access line weekly and PRN  (Home infusion instructions - [facility] )        06/10/21 1231   06/10/21 0000  Change dressing       Comments: Maintain surgical dressing until follow up in the clinic. If the edges start to pull up, may reinforce  with tape. If the dressing is no longer working, may remove and cover with gauze and tape, but must keep the area dry and clean.  Call with any questions or concerns.   06/10/21 1338            Follow-up Information     Paralee Cancel, MD. Schedule an appointment as soon as possible for a visit in 2 week(s).   Specialty: Orthopedic Surgery Contact information: [address] [place] [address] [phone]         [facility] Follow up.   Why: IV antibiotics. RN will be provided through [facility].                Signed: Griffith Citron, PA-C Orthopedic Surgery 06/12/2021, 9:44 AM

## 2021-06-13 DIAGNOSIS — T8454XA Infection and inflammatory reaction due to internal left knee prosthesis, initial encounter: Secondary | ICD-10-CM | POA: Diagnosis not present

## 2021-06-14 DIAGNOSIS — T8454XA Infection and inflammatory reaction due to internal left knee prosthesis, initial encounter: Secondary | ICD-10-CM | POA: Diagnosis not present

## 2021-06-15 DIAGNOSIS — T8454XA Infection and inflammatory reaction due to internal left knee prosthesis, initial encounter: Secondary | ICD-10-CM | POA: Diagnosis not present

## 2021-06-15 LAB — AEROBIC/ANAEROBIC CULTURE W GRAM STAIN (SURGICAL/DEEP WOUND)
Culture: NO GROWTH
Culture: NO GROWTH
Gram Stain: NONE SEEN
Gram Stain: NONE SEEN

## 2021-06-16 ENCOUNTER — Encounter: Payer: Self-pay | Admitting: Infectious Disease

## 2021-06-16 DIAGNOSIS — M25662 Stiffness of left knee, not elsewhere classified: Secondary | ICD-10-CM | POA: Diagnosis not present

## 2021-06-16 DIAGNOSIS — M01X62 Direct infection of left knee in infectious and parasitic diseases classified elsewhere: Secondary | ICD-10-CM | POA: Diagnosis not present

## 2021-06-16 DIAGNOSIS — T8454XA Infection and inflammatory reaction due to internal left knee prosthesis, initial encounter: Secondary | ICD-10-CM | POA: Diagnosis not present

## 2021-06-17 DIAGNOSIS — M25662 Stiffness of left knee, not elsewhere classified: Secondary | ICD-10-CM | POA: Diagnosis not present

## 2021-06-17 DIAGNOSIS — T8454XA Infection and inflammatory reaction due to internal left knee prosthesis, initial encounter: Secondary | ICD-10-CM | POA: Diagnosis not present

## 2021-06-18 DIAGNOSIS — T8454XA Infection and inflammatory reaction due to internal left knee prosthesis, initial encounter: Secondary | ICD-10-CM | POA: Diagnosis not present

## 2021-06-19 DIAGNOSIS — T8454XA Infection and inflammatory reaction due to internal left knee prosthesis, initial encounter: Secondary | ICD-10-CM | POA: Diagnosis not present

## 2021-06-19 DIAGNOSIS — M25662 Stiffness of left knee, not elsewhere classified: Secondary | ICD-10-CM | POA: Diagnosis not present

## 2021-06-20 DIAGNOSIS — T8454XA Infection and inflammatory reaction due to internal left knee prosthesis, initial encounter: Secondary | ICD-10-CM | POA: Diagnosis not present

## 2021-06-21 DIAGNOSIS — T8454XA Infection and inflammatory reaction due to internal left knee prosthesis, initial encounter: Secondary | ICD-10-CM | POA: Diagnosis not present

## 2021-06-21 HISTORY — PX: OTHER SURGICAL HISTORY: SHX169

## 2021-06-22 DIAGNOSIS — T8454XA Infection and inflammatory reaction due to internal left knee prosthesis, initial encounter: Secondary | ICD-10-CM | POA: Diagnosis not present

## 2021-06-23 DIAGNOSIS — M25662 Stiffness of left knee, not elsewhere classified: Secondary | ICD-10-CM | POA: Diagnosis not present

## 2021-06-23 DIAGNOSIS — M01X62 Direct infection of left knee in infectious and parasitic diseases classified elsewhere: Secondary | ICD-10-CM | POA: Diagnosis not present

## 2021-06-23 DIAGNOSIS — T8454XA Infection and inflammatory reaction due to internal left knee prosthesis, initial encounter: Secondary | ICD-10-CM | POA: Diagnosis not present

## 2021-06-24 DIAGNOSIS — M1990 Unspecified osteoarthritis, unspecified site: Secondary | ICD-10-CM | POA: Diagnosis not present

## 2021-06-24 DIAGNOSIS — T8454XA Infection and inflammatory reaction due to internal left knee prosthesis, initial encounter: Secondary | ICD-10-CM | POA: Diagnosis not present

## 2021-06-25 DIAGNOSIS — M25662 Stiffness of left knee, not elsewhere classified: Secondary | ICD-10-CM | POA: Diagnosis not present

## 2021-06-25 DIAGNOSIS — T8454XA Infection and inflammatory reaction due to internal left knee prosthesis, initial encounter: Secondary | ICD-10-CM | POA: Diagnosis not present

## 2021-06-26 DIAGNOSIS — T8454XA Infection and inflammatory reaction due to internal left knee prosthesis, initial encounter: Secondary | ICD-10-CM | POA: Diagnosis not present

## 2021-06-27 DIAGNOSIS — T8454XA Infection and inflammatory reaction due to internal left knee prosthesis, initial encounter: Secondary | ICD-10-CM | POA: Diagnosis not present

## 2021-06-28 DIAGNOSIS — T8454XA Infection and inflammatory reaction due to internal left knee prosthesis, initial encounter: Secondary | ICD-10-CM | POA: Diagnosis not present

## 2021-06-29 DIAGNOSIS — T8454XA Infection and inflammatory reaction due to internal left knee prosthesis, initial encounter: Secondary | ICD-10-CM | POA: Diagnosis not present

## 2021-06-29 DIAGNOSIS — M25662 Stiffness of left knee, not elsewhere classified: Secondary | ICD-10-CM | POA: Diagnosis not present

## 2021-06-30 DIAGNOSIS — T8454XA Infection and inflammatory reaction due to internal left knee prosthesis, initial encounter: Secondary | ICD-10-CM | POA: Diagnosis not present

## 2021-07-01 DIAGNOSIS — M25661 Stiffness of right knee, not elsewhere classified: Secondary | ICD-10-CM | POA: Diagnosis not present

## 2021-07-01 DIAGNOSIS — T8454XA Infection and inflammatory reaction due to internal left knee prosthesis, initial encounter: Secondary | ICD-10-CM | POA: Diagnosis not present

## 2021-07-02 DIAGNOSIS — T8454XA Infection and inflammatory reaction due to internal left knee prosthesis, initial encounter: Secondary | ICD-10-CM | POA: Diagnosis not present

## 2021-07-02 DIAGNOSIS — Z96652 Presence of left artificial knee joint: Secondary | ICD-10-CM | POA: Diagnosis not present

## 2021-07-02 DIAGNOSIS — Z471 Aftercare following joint replacement surgery: Secondary | ICD-10-CM | POA: Diagnosis not present

## 2021-07-02 DIAGNOSIS — M7591 Shoulder lesion, unspecified, right shoulder: Secondary | ICD-10-CM | POA: Diagnosis not present

## 2021-07-02 DIAGNOSIS — M25511 Pain in right shoulder: Secondary | ICD-10-CM | POA: Diagnosis not present

## 2021-07-03 ENCOUNTER — Telehealth: Payer: Self-pay

## 2021-07-03 DIAGNOSIS — T8454XA Infection and inflammatory reaction due to internal left knee prosthesis, initial encounter: Secondary | ICD-10-CM | POA: Diagnosis not present

## 2021-07-03 NOTE — Telephone Encounter (Signed)
[facility] called stating that labs collected on 06/30/21 were lost by [facility]. RN asked them to please re-draw.   Beryle Flock, RN

## 2021-07-03 NOTE — Telephone Encounter (Signed)
No problem - thank you for coordinating with them Jinny Blossom!

## 2021-07-04 DIAGNOSIS — T8454XA Infection and inflammatory reaction due to internal left knee prosthesis, initial encounter: Secondary | ICD-10-CM | POA: Diagnosis not present

## 2021-07-05 DIAGNOSIS — T8454XA Infection and inflammatory reaction due to internal left knee prosthesis, initial encounter: Secondary | ICD-10-CM | POA: Diagnosis not present

## 2021-07-06 DIAGNOSIS — F419 Anxiety disorder, unspecified: Secondary | ICD-10-CM | POA: Diagnosis not present

## 2021-07-06 DIAGNOSIS — M01X62 Direct infection of left knee in infectious and parasitic diseases classified elsewhere: Secondary | ICD-10-CM | POA: Diagnosis not present

## 2021-07-06 DIAGNOSIS — M25661 Stiffness of right knee, not elsewhere classified: Secondary | ICD-10-CM | POA: Diagnosis not present

## 2021-07-06 DIAGNOSIS — F4389 Other reactions to severe stress: Secondary | ICD-10-CM | POA: Diagnosis not present

## 2021-07-06 DIAGNOSIS — T8454XA Infection and inflammatory reaction due to internal left knee prosthesis, initial encounter: Secondary | ICD-10-CM | POA: Diagnosis not present

## 2021-07-06 DIAGNOSIS — M25561 Pain in right knee: Secondary | ICD-10-CM | POA: Diagnosis not present

## 2021-07-06 DIAGNOSIS — Z792 Long term (current) use of antibiotics: Secondary | ICD-10-CM | POA: Diagnosis not present

## 2021-07-07 DIAGNOSIS — T8454XA Infection and inflammatory reaction due to internal left knee prosthesis, initial encounter: Secondary | ICD-10-CM | POA: Diagnosis not present

## 2021-07-08 DIAGNOSIS — Z5181 Encounter for therapeutic drug level monitoring: Secondary | ICD-10-CM | POA: Insufficient documentation

## 2021-07-08 DIAGNOSIS — M25662 Stiffness of left knee, not elsewhere classified: Secondary | ICD-10-CM | POA: Diagnosis not present

## 2021-07-08 DIAGNOSIS — T8454XA Infection and inflammatory reaction due to internal left knee prosthesis, initial encounter: Secondary | ICD-10-CM | POA: Diagnosis not present

## 2021-07-08 DIAGNOSIS — Z969 Presence of functional implant, unspecified: Secondary | ICD-10-CM | POA: Insufficient documentation

## 2021-07-08 DIAGNOSIS — M25561 Pain in right knee: Secondary | ICD-10-CM | POA: Diagnosis not present

## 2021-07-08 DIAGNOSIS — M25661 Stiffness of right knee, not elsewhere classified: Secondary | ICD-10-CM | POA: Diagnosis not present

## 2021-07-08 DIAGNOSIS — Z452 Encounter for adjustment and management of vascular access device: Secondary | ICD-10-CM | POA: Insufficient documentation

## 2021-07-08 NOTE — Progress Notes (Signed)
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    PUD (peptic ulcer disease)    Infection of prosthetic left knee joint ([place]) 06/09/2021   S/P revision of total knee, left 04/28/2021   S/P lumbar fusion 06/23/2020   DDD (degenerative disc disease), cervical 12/13/2019   Lumbar post-laminectomy syndrome 12/13/2019   Degeneration of lumbar intervertebral disc 12/13/2019   Impingement syndrome of left shoulder region 06/23/2018   Pain in joint of left shoulder 04/26/2018   Low back pain 08/24/2017   Knee stiffness, left 08/03/2016   History of total knee replacement, left 08/12/2015   Expected blood loss anemia, postoperative 11/21/2012   Overweight (BMI 25.0-29.9) 11/21/2012   S/P left UKA to TKA 86/75/4492   Umbilical hernia 01/00/7121   Current Outpatient Medications on File Prior to Visit  Medication Sig Dispense Refill   aspirin 81 MG EC tablet 1 tablet     ceFAZolin (ANCEF) IVPB Inject 2 g into the vein every 8 (eight) hours. Indication:  Staph lugdenensis prosthetic joint infection First Dose: Yes Last Day of Therapy:  07/22/2021 Labs - Once weekly:  CBC/D and BMP, Labs - Every other week:  ESR and CRP Method of administration: IV Push Method of administration may be changed at the discretion of home infusion pharmacist based upon assessment of the patient and/or caregiver's ability to self-administer the medication ordered. 126 Units 0   ceFAZolin (ANCEF) IVPB Inject 2 g into the vein every 8 (eight) hours. Indication:  Staph lugdenensis PJI First Dose: Yes Last Day of Therapy:  07/22/21 Labs - Once weekly:  CBC/D and BMP, Labs - Every other week:  ESR and CRP Method of administration: IV Push Method of administration may be changed at the discretion of home infusion pharmacist based upon assessment of the patient and/or caregiver's ability to self-administer the medication ordered. 126 Units 0   omeprazole (PRILOSEC OTC) 20 MG tablet  take 2 tablets     oxyCODONE (OXY IR/ROXICODONE) 5 MG immediate release tablet Take 1-2 tablets (5-10 mg total) by mouth every 4 (four) hours as needed for severe pain. 56 tablet 0   No current facility-administered medications on file prior to visit.    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 He also had low grade fevers the week before hospital admission  and prepatellar fluid sent for analysis which grew MS staph lugdunensis 06/03/21. Underwent 06/10/21 excisional and non-excisional debridement of left knee/Revision polyethylene with removal of old polyethylene, placement of new polyethylene.  Seen by ID Dr. Drucilla Schmidt, plan for 6 weeks of IV cefazolin through a PICC.  End date 07/22/2020  Doing well.  Denies any issues with PICC line for IV antibiotics.  Working with rehab.  Denies any knee pain, swelling.  Surgical site has healed well.  He is able to walk, run and play pickle ball and tennis without any issues.  Denies any prior history of prosthetic joint infections prior to current infection and being on long-term IV antibiotics.  Review of Systems:  ROS negative for fever, chills, nausea vomiting and diarrhea All other systems reviewed and are negative  Past Medical History:  Diagnosis Date   Arthritis    Back pain    radiates into the left posterior thigh to the calf.   GERD (gastroesophageal reflux disease)    History of kidney stones    Left shoulder pain    Leg pain    Worse than back pain   Lumbar foraminal stenosis     No pertinent past medical history    Radiculopathy, lumbar region    Past Surgical History:  Procedure Laterality Date   ABDOMINAL EXPOSURE N/A 06/23/2020   Procedure: ABDOMINAL EXPOSURE;  Surgeon: Marty Heck, MD;  Location: [facility];  Service: Vascular;  Laterality: N/A;   ANTERIOR LUMBAR FUSION N/A 06/23/2020   Procedure: Anterior Lumbar Interbody Fusion - Lumbar five-Sacral one;  Surgeon: Eustace Moore, MD;  Location: [facility];  Service: Neurosurgery;  Laterality: N/A;   BACK SURGERY  2009   BICEPS TENDON REPAIR  2011   EYE SURGERY     Lasik   I & D KNEE WITH POLY EXCHANGE Left 06/09/2021   Procedure: IRRIGATION AND DEBRIDEMENT KNEE WITH POLY REVISION;  Surgeon: Paralee Cancel, MD;  Location: [facility];  Service: Orthopedics;  Laterality: Left;   JOINT REPLACEMENT     both partial knee replacements   KNEE ARTHROSCOPY  11/12   lt   KNEE SURGERY  2009/2011   Rt and Lt knee replacment   LASER SPINE SURGERY     SCAR REVISION Left 08/03/2016   Procedure: Left hip open scar debridement ;  Surgeon: Paralee Cancel, MD;  Location: [facility];  Service: Orthopedics;  Laterality: Left;   shoulder cyst  06/04/11   right cyst removed   TONSILLECTOMY     TOTAL HIP ARTHROPLASTY Left 08/12/2015   Procedure: LEFT TOTAL HIP ARTHROPLASTY ANTERIOR APPROACH;  Surgeon: Paralee Cancel, MD;  Location: [facility];  Service: Orthopedics;  Laterality: Left;   TOTAL KNEE ARTHROPLASTY Left 11/20/2012   Procedure: CONVERT LEFT UNI KNEE ARTHROPLASTY TO LEFT TOTAL KNEE ARTHROPLASTY;  Surgeon: Mauri Pole, MD;  Location: [facility];  Service: Orthopedics;  Laterality: Left;   TOTAL KNEE REVISION Left 04/28/2021   Procedure: TOTAL KNEE REVISION;  Surgeon: Paralee Cancel, MD;  Location: [facility];  Service: Orthopedics;  Laterality: Left;   TOTAL KNEE REVISION WITH SCAR DEBRIDEMENT/PATELLA REVISION WITH POLY EXCHANGE Left 08/03/2016   Procedure: Left knee open scar debridement, poly exchange ;  Surgeon: Paralee Cancel, MD;  Location: [facility];   Service: Orthopedics;  Laterality: Left;   UMBILICAL HERNIA REPAIR  06/04/2011   Procedure: HERNIA REPAIR UMBILICAL ADULT;  Surgeon: Rolm Bookbinder, MD;  Location: [facility];  Service: General;  Laterality: N/A;  umbilical hernia repiar and mesh and removal of lympoma on right shoulder     Social History   Tobacco Use   Smoking status: Never   Smokeless tobacco: Never  Vaping Use   Vaping Use: Never used  Substance Use Topics   Alcohol use: Yes    Comment: occassionally   Drug use: Never    No family history on file.  No Known Allergies  Health Maintenance  Topic Date Due   COVID-19 Vaccine (1) Never done   TETANUS/TDAP  Never done   COLONOSCOPY (Pts 45-33yr Insurance coverage will need to be confirmed)  Never done   Zoster Vaccines- Shingrix (1 of 2) Never done   INFLUENZA VACCINE  01/19/2021   Hepatitis C Screening  Completed   HIV Screening  Completed   Pneumococcal Vaccine 40-12 Years old  Aged Out   HPV VACCINES  Aged Out    Objective: BP 127/84    Pulse 76    Temp 97.9 F (36.6 C) (Oral)    Wt 219 lb (99.3 kg)    SpO2 95%    BMI 29.70 kg/m    Physical Exam Constitutional:      Appearance: Normal appearance.  HENT:     Head: Normocephalic and atraumatic.      Mouth: Mucous membranes are moist.  Eyes:    Conjunctiva/sclera: Conjunctivae normal.     Pupils: Pupils are equal, round  Cardiovascular:     Rate and Rhythm: Normal rate and regular rhythm.     Heart sounds: No murmur heard.   Pulmonary:     Effort: Pulmonary effort is normal.     Breath sounds: Normal breath sounds.   Abdominal:     General: Abdomen is flat.     Palpations: Abdomen is soft.   Musculoskeletal:        General: Normal range of motion.   Left knee   Skin:    General: Skin is warm and dry.     Comments:  Neurological:     General: No focal deficit present.     Mental Status: Awake , alert and oriented to person, place, and time.   Psychiatric:         Mood and Affect: Mood normal.   Lab Results Lab Results  Component Value Date   WBC 8.1 06/10/2021   HGB 12.1 (L) 06/10/2021   HCT 35.5 (L) 06/10/2021   MCV 89.9 06/10/2021   PLT 207 06/10/2021    Lab Results  Component Value Date   CREATININE 1.07 06/10/2021   BUN 24 (H) 06/10/2021   NA 137 06/10/2021   K 4.3 06/10/2021   CL 107 06/10/2021   CO2 22 06/10/2021    Lab Results  Component Value Date   ALT 21 04/16/2021   AST 23 04/16/2021   ALKPHOS 60 04/16/2021   BILITOT 2.1 (H) 04/16/2021    No results found for: CHOL, HDL, LDLCALC, LDLDIRECT, TRIG, CHOLHDL No results found for: LABRPR, RPRTITER No results found for: HIV1RNAQUANT, HIV1RNAVL, CD4TABS   Problem List Items Addressed This Visit       Musculoskeletal and Integument   Infection of prosthetic left knee joint ([place]) - Primary   Relevant Medications   cefadroxil (DURICEF) 500 MG capsule     Other   Medication monitoring encounter   Presence of retained hardware   PICC (peripherally inserted central catheter) in place   Assessment/plan Left knee PJI in the setting of multiple aspirations due to prepatellar swelling Medication monitoring - labs from [facility] reviewed  PICC line in place - no concerns   Complete 6 weeks of IV cefazolin on 07/22/2021.  Rifampin was not used for PJI.  Will not start now as patient is almost about to complete 6 weeks course of IV antibiotics. PICC line to be removed after last dose of IV cefazolin on 07/22/2021 Start cefadroxil 500 mg p.o. twice daily from 07/23/2021 Follow-up in 4 weeks Follow-up with Ortho as instructed  I have personally spent 50  minutes involved in face-to-face and non-face-to-face activities for this patient on the day of the visit. Professional time spent includes the following activities: Preparing to see the patient (review of tests), Obtaining and/or reviewing separately obtained  history (admission/discharge record), Performing a medically appropriate  examination and/or evaluation , Ordering medications/tests/procedures, referring and communicating with other health care professionals, Documenting clinical information in the EMR, Independently interpreting results (not separately reported), Communicating results to the patient/family/caregiver, Counseling and educating the patient/family/caregiver and Care coordination (not separately reported).   Wilber Oliphant, [place] [facility] 07/08/2021, 3:12 PM

## 2021-07-09 ENCOUNTER — Other Ambulatory Visit: Payer: Self-pay

## 2021-07-09 ENCOUNTER — Ambulatory Visit (INDEPENDENT_AMBULATORY_CARE_PROVIDER_SITE_OTHER): Payer: BC Managed Care – PPO | Admitting: Infectious Diseases

## 2021-07-09 ENCOUNTER — Encounter: Payer: Self-pay | Admitting: Infectious Diseases

## 2021-07-09 ENCOUNTER — Telehealth: Payer: Self-pay

## 2021-07-09 VITALS — BP 127/84 | HR 76 | Temp 97.9°F | Wt 219.0 lb

## 2021-07-09 DIAGNOSIS — T8454XA Infection and inflammatory reaction due to internal left knee prosthesis, initial encounter: Secondary | ICD-10-CM | POA: Diagnosis not present

## 2021-07-09 DIAGNOSIS — Z452 Encounter for adjustment and management of vascular access device: Secondary | ICD-10-CM | POA: Diagnosis not present

## 2021-07-09 DIAGNOSIS — Z5181 Encounter for therapeutic drug level monitoring: Secondary | ICD-10-CM

## 2021-07-09 DIAGNOSIS — Z969 Presence of functional implant, unspecified: Secondary | ICD-10-CM | POA: Diagnosis not present

## 2021-07-09 MED ORDER — CEFADROXIL 500 MG PO CAPS: 500.0000 mg | ORAL_CAPSULE | Freq: Two times a day (BID) | ORAL | 5 refills | Status: DC

## 2021-07-09 NOTE — Telephone Encounter (Signed)
Thank you :)

## 2021-07-09 NOTE — Telephone Encounter (Signed)
Per MD called [facility] with orders to pull picc after last on 07/22/21. Spoke with Jeani Hawking, Pharmacist who was able to take orders. Patient is aware. Leatrice Jewels, RMA

## 2021-07-10 DIAGNOSIS — T8454XA Infection and inflammatory reaction due to internal left knee prosthesis, initial encounter: Secondary | ICD-10-CM | POA: Diagnosis not present

## 2021-07-11 DIAGNOSIS — T8454XA Infection and inflammatory reaction due to internal left knee prosthesis, initial encounter: Secondary | ICD-10-CM | POA: Diagnosis not present

## 2021-07-12 DIAGNOSIS — T8454XA Infection and inflammatory reaction due to internal left knee prosthesis, initial encounter: Secondary | ICD-10-CM | POA: Diagnosis not present

## 2021-07-13 DIAGNOSIS — Z792 Long term (current) use of antibiotics: Secondary | ICD-10-CM | POA: Diagnosis not present

## 2021-07-13 DIAGNOSIS — F419 Anxiety disorder, unspecified: Secondary | ICD-10-CM | POA: Diagnosis not present

## 2021-07-13 DIAGNOSIS — F4389 Other reactions to severe stress: Secondary | ICD-10-CM | POA: Diagnosis not present

## 2021-07-13 DIAGNOSIS — T8454XA Infection and inflammatory reaction due to internal left knee prosthesis, initial encounter: Secondary | ICD-10-CM | POA: Diagnosis not present

## 2021-07-13 DIAGNOSIS — M01X62 Direct infection of left knee in infectious and parasitic diseases classified elsewhere: Secondary | ICD-10-CM | POA: Diagnosis not present

## 2021-07-13 DIAGNOSIS — M25661 Stiffness of right knee, not elsewhere classified: Secondary | ICD-10-CM | POA: Diagnosis not present

## 2021-07-13 DIAGNOSIS — M25662 Stiffness of left knee, not elsewhere classified: Secondary | ICD-10-CM | POA: Diagnosis not present

## 2021-07-13 DIAGNOSIS — M25561 Pain in right knee: Secondary | ICD-10-CM | POA: Diagnosis not present

## 2021-07-14 DIAGNOSIS — T8454XA Infection and inflammatory reaction due to internal left knee prosthesis, initial encounter: Secondary | ICD-10-CM | POA: Diagnosis not present

## 2021-07-15 DIAGNOSIS — T8454XA Infection and inflammatory reaction due to internal left knee prosthesis, initial encounter: Secondary | ICD-10-CM | POA: Diagnosis not present

## 2021-07-15 DIAGNOSIS — M25662 Stiffness of left knee, not elsewhere classified: Secondary | ICD-10-CM | POA: Diagnosis not present

## 2021-07-16 DIAGNOSIS — T8454XA Infection and inflammatory reaction due to internal left knee prosthesis, initial encounter: Secondary | ICD-10-CM | POA: Diagnosis not present

## 2021-07-17 DIAGNOSIS — T8454XA Infection and inflammatory reaction due to internal left knee prosthesis, initial encounter: Secondary | ICD-10-CM | POA: Diagnosis not present

## 2021-07-18 DIAGNOSIS — T8454XA Infection and inflammatory reaction due to internal left knee prosthesis, initial encounter: Secondary | ICD-10-CM | POA: Diagnosis not present

## 2021-07-19 DIAGNOSIS — T8454XA Infection and inflammatory reaction due to internal left knee prosthesis, initial encounter: Secondary | ICD-10-CM | POA: Diagnosis not present

## 2021-07-20 DIAGNOSIS — T8454XA Infection and inflammatory reaction due to internal left knee prosthesis, initial encounter: Secondary | ICD-10-CM | POA: Diagnosis not present

## 2021-07-21 DIAGNOSIS — T8454XA Infection and inflammatory reaction due to internal left knee prosthesis, initial encounter: Secondary | ICD-10-CM | POA: Diagnosis not present

## 2021-07-22 DIAGNOSIS — M25561 Pain in right knee: Secondary | ICD-10-CM | POA: Diagnosis not present

## 2021-07-22 DIAGNOSIS — T8454XA Infection and inflammatory reaction due to internal left knee prosthesis, initial encounter: Secondary | ICD-10-CM | POA: Diagnosis not present

## 2021-07-24 DIAGNOSIS — M25561 Pain in right knee: Secondary | ICD-10-CM | POA: Diagnosis not present

## 2021-07-27 DIAGNOSIS — F419 Anxiety disorder, unspecified: Secondary | ICD-10-CM | POA: Diagnosis not present

## 2021-07-27 DIAGNOSIS — F4389 Other reactions to severe stress: Secondary | ICD-10-CM | POA: Diagnosis not present

## 2021-07-28 DIAGNOSIS — M25561 Pain in right knee: Secondary | ICD-10-CM | POA: Diagnosis not present

## 2021-07-31 DIAGNOSIS — M25561 Pain in right knee: Secondary | ICD-10-CM | POA: Diagnosis not present

## 2021-07-31 DIAGNOSIS — M25661 Stiffness of right knee, not elsewhere classified: Secondary | ICD-10-CM | POA: Diagnosis not present

## 2021-08-03 ENCOUNTER — Telehealth: Payer: Self-pay

## 2021-08-03 DIAGNOSIS — R0981 Nasal congestion: Secondary | ICD-10-CM | POA: Diagnosis not present

## 2021-08-03 DIAGNOSIS — J302 Other seasonal allergic rhinitis: Secondary | ICD-10-CM | POA: Diagnosis not present

## 2021-08-03 DIAGNOSIS — R051 Acute cough: Secondary | ICD-10-CM | POA: Diagnosis not present

## 2021-08-03 NOTE — Telephone Encounter (Signed)
Patient called wanting to know if he could do blood pressure cuff therapy on his quads. Patient wanted to ask Dr.Manandhar due to having knee infection. Please advise    Harry Cobb Harry Cobb, CMA

## 2021-08-03 NOTE — Telephone Encounter (Signed)
I do not have any restrictions but I would recommend checking with his orthopedic surgeon.

## 2021-08-03 NOTE — Telephone Encounter (Signed)
Patient aware and verbalized his understanding.   [place], CMA

## 2021-08-04 DIAGNOSIS — M25661 Stiffness of right knee, not elsewhere classified: Secondary | ICD-10-CM | POA: Diagnosis not present

## 2021-08-04 DIAGNOSIS — M25662 Stiffness of left knee, not elsewhere classified: Secondary | ICD-10-CM | POA: Diagnosis not present

## 2021-08-05 ENCOUNTER — Other Ambulatory Visit: Payer: Self-pay

## 2021-08-05 ENCOUNTER — Encounter: Payer: Self-pay | Admitting: Infectious Diseases

## 2021-08-05 ENCOUNTER — Ambulatory Visit (INDEPENDENT_AMBULATORY_CARE_PROVIDER_SITE_OTHER): Payer: BC Managed Care – PPO | Admitting: Infectious Diseases

## 2021-08-05 VITALS — BP 118/77 | HR 52 | Temp 97.4°F | Resp 16 | Ht 72.0 in | Wt 216.0 lb

## 2021-08-05 DIAGNOSIS — T8454XD Infection and inflammatory reaction due to internal left knee prosthesis, subsequent encounter: Secondary | ICD-10-CM

## 2021-08-05 DIAGNOSIS — Z5181 Encounter for therapeutic drug level monitoring: Secondary | ICD-10-CM

## 2021-08-05 LAB — CBC
HCT: 40.5 % (ref 38.5–50.0)
Hemoglobin: 14 g/dL (ref 13.2–17.1)
MCH: 31 pg (ref 27.0–33.0)
MCHC: 34.6 g/dL (ref 32.0–36.0)
MCV: 89.8 fL (ref 80.0–100.0)
MPV: 11.7 fL (ref 7.5–12.5)
Platelets: 156 10*3/uL (ref 140–400)
RBC: 4.51 10*6/uL (ref 4.20–5.80)
RDW: 12.7 % (ref 11.0–15.0)
WBC: 4.5 10*3/uL (ref 3.8–10.8)

## 2021-08-05 LAB — BASIC METABOLIC PANEL
BUN: 24 mg/dL (ref 7–25)
CO2: 28 mmol/L (ref 20–32)
Calcium: 8.8 mg/dL (ref 8.6–10.3)
Chloride: 107 mmol/L (ref 98–110)
Creat: 1.12 mg/dL (ref 0.70–1.30)
Glucose, Bld: 79 mg/dL (ref 65–99)
Potassium: 4.2 mmol/L (ref 3.5–5.3)
Sodium: 141 mmol/L (ref 135–146)

## 2021-08-05 NOTE — Progress Notes (Signed)
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    PUD (peptic ulcer disease)    Infection of prosthetic left knee joint ([place]) 06/09/2021   S/P revision of total knee, left 04/28/2021   S/P lumbar fusion 06/23/2020   DDD (degenerative disc disease), cervical 12/13/2019   Lumbar post-laminectomy syndrome 12/13/2019   Degeneration of lumbar intervertebral disc 12/13/2019   Impingement syndrome of left shoulder region 06/23/2018   Pain in joint of left shoulder 04/26/2018   Low back pain 08/24/2017   Knee stiffness, left 08/03/2016   History of total knee replacement, left 08/12/2015   Expected blood loss anemia, postoperative 11/21/2012   Overweight (BMI 25.0-29.9) 11/21/2012   S/P left UKA to TKA 54/56/2563   Umbilical hernia 89/37/3428   Current Outpatient Medications on File Prior to Visit  Medication Sig Dispense Refill   cefadroxil (DURICEF) 500 MG capsule Take 1 capsule (500 mg total) by mouth 2 (two) times daily. 60 capsule 5   fexofenadine (ALLEGRA ALLERGY) 60 MG tablet Take 60 mg by mouth 2 (two) times daily.     omeprazole (PRILOSEC OTC) 20 MG tablet take 2 tablets     diclofenac (VOLTAREN) 75 MG EC tablet Take 75 mg by mouth 2 (two) times daily.     No current facility-administered medications on file prior to visit.    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 He also had low grade fevers the week before hospital admission  and prepatellar fluid sent for analysis which grew MS staph lugdunensis 06/03/21. Underwent 06/10/21 excisional and non-excisional debridement of left knee/Revision polyethylene with removal of old polyethylene, placement of new polyethylene.  Seen by ID Dr. Drucilla Schmidt, plan for 6 weeks of IV cefazolin through a PICC.  End date 07/22/2020  Doing well.  Denies any issues with PICC line for IV antibiotics.  Working with rehab.  Denies any knee pain, swelling.  Surgical site has healed well.  He is able to walk, run and play pickle ball and tennis without any issues.  Denies any prior history of prosthetic joint infections prior to current infection and being on long-term IV antibiotics.  08/05/21 Completed IV abtx on 07/22/21. PICC line has been removed. Started cefadroxil same day on 2/1 evening. He is taking cefadroxil twice a day. Denies any issues like fevers, chills. Denies nausea, vomiting or diarrhea. He is active physically and ambulatory.  Denies pain/tenderness and swelling at the left knee.   Review of Systems: ROS negative for fever, chills, nausea vomiting and diarrhea All other systems reviewed and are negative  Past Medical History:  Diagnosis Date   Arthritis    Back pain    radiates into the left posterior thigh to the calf.   GERD (gastroesophageal reflux disease)    History of kidney stones    Left shoulder pain    Leg pain    Worse  than back pain   Lumbar foraminal stenosis    No pertinent past medical history    Radiculopathy, lumbar region    Past Surgical History:  Procedure Laterality Date   ABDOMINAL EXPOSURE N/A 06/23/2020   Procedure: ABDOMINAL EXPOSURE;  Surgeon: Marty Heck, MD;  Location: [facility];  Service: Vascular;  Laterality: N/A;   ANTERIOR LUMBAR FUSION N/A 06/23/2020   Procedure: Anterior Lumbar Interbody Fusion - Lumbar five-Sacral one;  Surgeon: Eustace Moore, MD;  Location: [place];  Service: Neurosurgery;  Laterality: N/A;   BACK SURGERY  2009   BICEPS TENDON REPAIR  2011   EYE SURGERY     Lasik   I & D KNEE WITH POLY EXCHANGE Left 06/09/2021   Procedure: IRRIGATION AND DEBRIDEMENT KNEE WITH POLY REVISION;  Surgeon: Paralee Cancel, MD;  Location[facility];  Service: Orthopedics;  Laterality: Left;   JOINT REPLACEMENT     both partial knee replacements   KNEE ARTHROSCOPY  11/12   lt   KNEE SURGERY  2009/2011   Rt and Lt knee replacment   LASER SPINE SURGERY     SCAR REVISION Left 08/03/2016   Procedure: Left hip open scar debridement ;  Surgeon: Paralee Cancel, MD;  Location[facility];  Service: Orthopedics;  Laterality: Left;   shoulder cyst  06/04/11   right cyst removed   TONSILLECTOMY     TOTAL HIP ARTHROPLASTY Left 08/12/2015   Procedure: LEFT TOTAL HIP ARTHROPLASTY ANTERIOR APPROACH;  Surgeon: Paralee Cancel, MD;  [facility];  Service: Orthopedics;  Laterality: Left;   TOTAL KNEE ARTHROPLASTY Left 11/20/2012   Procedure: CONVERT LEFT UNI KNEE ARTHROPLASTY TO LEFT TOTAL KNEE ARTHROPLASTY;  Surgeon: Mauri Pole, MD;  [facility];  Service: Orthopedics;  Laterality: Left;   TOTAL KNEE REVISION Left 04/28/2021   Procedure: TOTAL KNEE REVISION;  Surgeon: Paralee Cancel, MD;  [facility];  Service: Orthopedics;  Laterality: Left;   TOTAL KNEE REVISION WITH SCAR DEBRIDEMENT/PATELLA REVISION WITH POLY EXCHANGE Left 08/03/2016   Procedure: Left knee open scar debridement, poly exchange ;  Surgeon: Paralee Cancel, MD;  [facility];  Service: Orthopedics;  Laterality: Left;   UMBILICAL HERNIA REPAIR  06/04/2011   Procedure: HERNIA REPAIR UMBILICAL ADULT;  Surgeon: Rolm Bookbinder, MD;  Location: [place];  Service: General;  Laterality: N/A;  umbilical hernia repiar and mesh and removal of lympoma on right shoulder     Social History   Tobacco Use   Smoking status: Never   Smokeless tobacco: Never  Vaping  Use   Vaping Use: Never used  Substance Use Topics   Alcohol use: Yes    Comment: occassionally   Drug use: Never    No family history on file.  No Known Allergies  Health Maintenance  Topic Date Due   TETANUS/TDAP  Never done   COLONOSCOPY (Pts 45-83yrs Insurance coverage will need to be confirmed)  Never done   Zoster Vaccines- Shingrix (1 of 2) Never done   INFLUENZA VACCINE  01/19/2021   Hepatitis C Screening  Completed   HIV Screening  Completed   HPV VACCINES  Aged Out    Objective: Temp (!) 97.4 F (36.3 C) (Temporal)    Resp 16    Ht 6' (1.829 m)    Wt 216 lb (98 kg)    BMI 29.29 kg/m    Physical Exam Constitutional:      Appearance: Normal appearance.  HENT:  Head: Normocephalic and atraumatic.      Mouth: Mucous membranes are moist.  Eyes:    Conjunctiva/sclera: Conjunctivae normal.     Pupils: Pupils are equal, round  Cardiovascular:     Rate and Rhythm: Normal rate and regular rhythm.     Heart sounds: No murmur heard.   Pulmonary:     Effort: Pulmonary effort is normal.     Breath sounds: Normal breath sounds.   Abdominal:     General:     Palpations: Abdomen is soft.   Musculoskeletal:        General: Normal range of motion.   Left knee - surgical site has healed, no signs of septic joint   Skin:    General: Skin is warm and dry.     Comments:  Neurological:     General: No focal deficit present.     Mental Status: Awake , alert and oriented to person, place, and time.   Psychiatric:        Mood and Affect: Mood normal.   Lab Results Lab Results  Component Value Date   WBC 8.1 06/10/2021   HGB 12.1 (L) 06/10/2021   HCT 35.5 (L) 06/10/2021   MCV 89.9 06/10/2021   PLT 207 06/10/2021    Lab Results  Component Value Date   CREATININE 1.07 06/10/2021   BUN 24 (H) 06/10/2021   NA 137 06/10/2021   K 4.3 06/10/2021   CL 107 06/10/2021   CO2 22 06/10/2021    Lab Results  Component Value Date   ALT 21 04/16/2021   AST 23  04/16/2021   ALKPHOS 60 04/16/2021   BILITOT 2.1 (H) 04/16/2021    No results found for: CHOL, HDL, LDLCALC, LDLDIRECT, TRIG, CHOLHDL No results found for: LABRPR, RPRTITER No results found for: HIV1RNAQUANT, HIV1RNAVL, CD4TABS  Problem List Items Addressed This Visit       Musculoskeletal and Integument   Infection of prosthetic left knee joint ([place])     Other   Medication monitoring encounter - Primary   Relevant Orders   CBC   Basic metabolic panel    Assessment/plan Left knee PJI in the setting of multiple aspirations due to prepatellar swelling S/p 6 weeks of IV cefazolin on 07/22/2021.  Medication monitoring   Started cefadroxil 500 mg p.o. twice daily from 07/22/2021 evening Labs today  He will need to be continued on cefadroxil for at least 6 months or longer Follow-up in 6  weeks for labs  I have personally spent 35  minutes involved in face-to-face and non-face-to-face activities for this patient on the day of the visit including staff time and counseling of the patient.   Wilber Oliphant, [facility] 08/05/2021, 2:32 PM

## 2021-08-06 ENCOUNTER — Telehealth: Payer: Self-pay

## 2021-08-06 NOTE — Telephone Encounter (Signed)
-----   Message from Rosiland Oz, MD sent at 08/06/2021  8:35 AM EST ----- Let him know labs look OK.

## 2021-08-06 NOTE — Telephone Encounter (Signed)
Message sent to patient on my chart

## 2021-08-11 DIAGNOSIS — M25662 Stiffness of left knee, not elsewhere classified: Secondary | ICD-10-CM | POA: Diagnosis not present

## 2021-08-14 DIAGNOSIS — Z4789 Encounter for other orthopedic aftercare: Secondary | ICD-10-CM | POA: Diagnosis not present

## 2021-08-18 DIAGNOSIS — F4389 Other reactions to severe stress: Secondary | ICD-10-CM | POA: Diagnosis not present

## 2021-08-18 DIAGNOSIS — F419 Anxiety disorder, unspecified: Secondary | ICD-10-CM | POA: Diagnosis not present

## 2021-08-18 DIAGNOSIS — M25662 Stiffness of left knee, not elsewhere classified: Secondary | ICD-10-CM | POA: Diagnosis not present

## 2021-08-21 DIAGNOSIS — M25561 Pain in right knee: Secondary | ICD-10-CM | POA: Diagnosis not present

## 2021-08-27 DIAGNOSIS — F419 Anxiety disorder, unspecified: Secondary | ICD-10-CM | POA: Diagnosis not present

## 2021-08-27 DIAGNOSIS — F4389 Other reactions to severe stress: Secondary | ICD-10-CM | POA: Diagnosis not present

## 2021-08-28 DIAGNOSIS — M25661 Stiffness of right knee, not elsewhere classified: Secondary | ICD-10-CM | POA: Diagnosis not present

## 2021-08-28 DIAGNOSIS — M25662 Stiffness of left knee, not elsewhere classified: Secondary | ICD-10-CM | POA: Diagnosis not present

## 2021-08-31 DIAGNOSIS — M25662 Stiffness of left knee, not elsewhere classified: Secondary | ICD-10-CM | POA: Diagnosis not present

## 2021-09-02 DIAGNOSIS — F4389 Other reactions to severe stress: Secondary | ICD-10-CM | POA: Diagnosis not present

## 2021-09-02 DIAGNOSIS — F419 Anxiety disorder, unspecified: Secondary | ICD-10-CM | POA: Diagnosis not present

## 2021-09-14 DIAGNOSIS — M25662 Stiffness of left knee, not elsewhere classified: Secondary | ICD-10-CM | POA: Diagnosis not present

## 2021-09-15 ENCOUNTER — Ambulatory Visit: Payer: BC Managed Care – PPO | Admitting: Infectious Diseases

## 2021-09-15 DIAGNOSIS — F4389 Other reactions to severe stress: Secondary | ICD-10-CM | POA: Diagnosis not present

## 2021-09-15 DIAGNOSIS — F419 Anxiety disorder, unspecified: Secondary | ICD-10-CM | POA: Diagnosis not present

## 2021-09-30 DIAGNOSIS — F419 Anxiety disorder, unspecified: Secondary | ICD-10-CM | POA: Diagnosis not present

## 2021-09-30 DIAGNOSIS — F4389 Other reactions to severe stress: Secondary | ICD-10-CM | POA: Diagnosis not present

## 2021-10-14 DIAGNOSIS — F419 Anxiety disorder, unspecified: Secondary | ICD-10-CM | POA: Diagnosis not present

## 2021-10-14 DIAGNOSIS — F4389 Other reactions to severe stress: Secondary | ICD-10-CM | POA: Diagnosis not present

## 2021-10-21 DIAGNOSIS — F419 Anxiety disorder, unspecified: Secondary | ICD-10-CM | POA: Diagnosis not present

## 2021-10-21 DIAGNOSIS — F4389 Other reactions to severe stress: Secondary | ICD-10-CM | POA: Diagnosis not present

## 2021-11-04 DIAGNOSIS — F4389 Other reactions to severe stress: Secondary | ICD-10-CM | POA: Diagnosis not present

## 2021-11-04 DIAGNOSIS — F419 Anxiety disorder, unspecified: Secondary | ICD-10-CM | POA: Diagnosis not present

## 2022-01-25 DIAGNOSIS — M79645 Pain in left finger(s): Secondary | ICD-10-CM | POA: Diagnosis not present

## 2022-01-25 DIAGNOSIS — M79642 Pain in left hand: Secondary | ICD-10-CM | POA: Diagnosis not present

## 2022-02-10 DIAGNOSIS — R350 Frequency of micturition: Secondary | ICD-10-CM | POA: Diagnosis not present

## 2022-02-10 DIAGNOSIS — N401 Enlarged prostate with lower urinary tract symptoms: Secondary | ICD-10-CM | POA: Diagnosis not present

## 2022-02-10 DIAGNOSIS — N411 Chronic prostatitis: Secondary | ICD-10-CM | POA: Diagnosis not present

## 2022-02-10 DIAGNOSIS — R351 Nocturia: Secondary | ICD-10-CM | POA: Diagnosis not present

## 2022-03-01 DIAGNOSIS — M79642 Pain in left hand: Secondary | ICD-10-CM | POA: Diagnosis not present

## 2022-04-14 DIAGNOSIS — M25662 Stiffness of left knee, not elsewhere classified: Secondary | ICD-10-CM | POA: Diagnosis not present

## 2022-04-14 DIAGNOSIS — M25521 Pain in right elbow: Secondary | ICD-10-CM | POA: Diagnosis not present

## 2022-04-16 DIAGNOSIS — M7711 Lateral epicondylitis, right elbow: Secondary | ICD-10-CM | POA: Diagnosis not present

## 2022-04-19 DIAGNOSIS — M7711 Lateral epicondylitis, right elbow: Secondary | ICD-10-CM | POA: Diagnosis not present

## 2022-04-19 DIAGNOSIS — M25662 Stiffness of left knee, not elsewhere classified: Secondary | ICD-10-CM | POA: Diagnosis not present

## 2022-04-19 DIAGNOSIS — M9905 Segmental and somatic dysfunction of pelvic region: Secondary | ICD-10-CM | POA: Diagnosis not present

## 2022-04-19 DIAGNOSIS — M9903 Segmental and somatic dysfunction of lumbar region: Secondary | ICD-10-CM | POA: Diagnosis not present

## 2022-04-19 DIAGNOSIS — M9902 Segmental and somatic dysfunction of thoracic region: Secondary | ICD-10-CM | POA: Diagnosis not present

## 2022-04-19 DIAGNOSIS — M9901 Segmental and somatic dysfunction of cervical region: Secondary | ICD-10-CM | POA: Diagnosis not present

## 2022-04-21 DIAGNOSIS — M9903 Segmental and somatic dysfunction of lumbar region: Secondary | ICD-10-CM | POA: Diagnosis not present

## 2022-04-21 DIAGNOSIS — M9901 Segmental and somatic dysfunction of cervical region: Secondary | ICD-10-CM | POA: Diagnosis not present

## 2022-04-21 DIAGNOSIS — M9902 Segmental and somatic dysfunction of thoracic region: Secondary | ICD-10-CM | POA: Diagnosis not present

## 2022-04-21 DIAGNOSIS — M9907 Segmental and somatic dysfunction of upper extremity: Secondary | ICD-10-CM | POA: Diagnosis not present

## 2022-04-21 DIAGNOSIS — M9905 Segmental and somatic dysfunction of pelvic region: Secondary | ICD-10-CM | POA: Diagnosis not present

## 2022-04-27 ENCOUNTER — Other Ambulatory Visit: Payer: Self-pay | Admitting: Family Medicine

## 2022-04-27 DIAGNOSIS — M9907 Segmental and somatic dysfunction of upper extremity: Secondary | ICD-10-CM | POA: Diagnosis not present

## 2022-04-27 DIAGNOSIS — M9902 Segmental and somatic dysfunction of thoracic region: Secondary | ICD-10-CM | POA: Diagnosis not present

## 2022-04-27 DIAGNOSIS — M9901 Segmental and somatic dysfunction of cervical region: Secondary | ICD-10-CM | POA: Diagnosis not present

## 2022-04-27 DIAGNOSIS — M9905 Segmental and somatic dysfunction of pelvic region: Secondary | ICD-10-CM | POA: Diagnosis not present

## 2022-04-27 DIAGNOSIS — M9903 Segmental and somatic dysfunction of lumbar region: Secondary | ICD-10-CM | POA: Diagnosis not present

## 2022-04-27 DIAGNOSIS — M25521 Pain in right elbow: Secondary | ICD-10-CM

## 2022-04-29 ENCOUNTER — Ambulatory Visit: Payer: BC Managed Care – PPO | Admitting: Orthopedic Surgery

## 2022-04-29 ENCOUNTER — Telehealth: Payer: Self-pay

## 2022-04-29 NOTE — Telephone Encounter (Signed)
-----   Message from Meredith Pel, MD sent at 04/29/2022 12:06 AM EST ----- Regarding: RE: dictate notes Hi Arleta Ostrum.  Can you send this to me has a message so I can dictate a note on him.  Thanks ----- Message ----- From: Javier Glazier Sent: 04/28/2022   4:16 PM EST To: Meredith Pel, MD; Laurann Montana, RT Subject: dictate notes                                  Patient is scheduled for right elbow common extensor tendon reattachment at [facility] on Monday Nov 13th at 9:30.  Patient is aware of location, date, and time of arrival.  PLEASE DICTATE

## 2022-04-29 NOTE — Telephone Encounter (Signed)
Harry Cobb is a 61 year old right-hand-dominant patient with right elbow pain.  Started having elbow pain about 2 months ago.  Received treatment in terms of dry needling and physical therapy at another orthopedic office.  He was playing in a state tournament when he developed severe worsening acutely of his lateral sided elbow pain.  Since that time he describes difficulty with extending his arm.  Denies any numbness and tingling or any neck pain.  Patient is otherwise healthy.  Takes Allegra and no other medications.  He does have a history of bilateral knee replacements 1 of which became infected and required revision.  He was able to make a quick recovery from that.  Denies any cardiac history.  Evidence of examination demonstrates lack of full extension by about 30 degrees.  He does have tenderness around the lateral condyle of the right arm.  Finger extension and wrist extension intact but painful.  Hand is perfused and sensate.  MRI scan shows avulsion of the common extensor attachment from the lateral epicondyle.  Possible involvement of the proximal attachment of the lateral collateral ligament..  No fracture and no significant arthritis in the right elbow.  Impression is right elbow common extensor attachment avulsion with minimal distal distraction.  Plan is open with fixation of the common extensor attachment to the lateral epicondyle using 1 versus 2 suture anchors.  The risk and benefits are discussed with Harry Cobb at length including not limited to incomplete restoration of function as well as elbow stiffness and potential for reinjury.  The extensive nature of the rehabilitative process is also discussed extensively.  Anticipate no return to sporting or racquet activities for 3 months.  Plan to start physical therapy 1 week after surgery for elbow range of motion.  All questions answered.

## 2022-04-29 NOTE — Telephone Encounter (Signed)
Note needed to obtain [place] for insurance.

## 2022-05-03 ENCOUNTER — Encounter: Payer: Self-pay | Admitting: Orthopedic Surgery

## 2022-05-03 ENCOUNTER — Other Ambulatory Visit: Payer: Self-pay | Admitting: Surgical

## 2022-05-03 DIAGNOSIS — G8918 Other acute postprocedural pain: Secondary | ICD-10-CM | POA: Diagnosis not present

## 2022-05-03 DIAGNOSIS — S56511A Strain of other extensor muscle, fascia and tendon at forearm level, right arm, initial encounter: Secondary | ICD-10-CM | POA: Diagnosis not present

## 2022-05-03 DIAGNOSIS — X58XXXA Exposure to other specified factors, initial encounter: Secondary | ICD-10-CM | POA: Diagnosis not present

## 2022-05-03 DIAGNOSIS — M66221 Spontaneous rupture of extensor tendons, right upper arm: Secondary | ICD-10-CM

## 2022-05-03 DIAGNOSIS — Y998 Other external cause status: Secondary | ICD-10-CM | POA: Diagnosis not present

## 2022-05-03 MED ORDER — OXYCODONE-ACETAMINOPHEN 5-325 MG PO TABS: 1.0000 | ORAL_TABLET | ORAL | 0 refills | Status: AC | PRN

## 2022-05-03 MED ORDER — METHOCARBAMOL 500 MG PO TABS: 500.0000 mg | ORAL_TABLET | Freq: Three times a day (TID) | ORAL | 1 refills | Status: AC | PRN

## 2022-05-03 NOTE — Progress Notes (Signed)
[name] is a 61 year old active patient with several month history of right elbow pain.  He also plays guitar in a band.  Localizes pain to the lateral aspect of the elbow.  1 week ago he was playing tennis and after hitting to 1 hand back and he really could not proceed with the match.  Had significant pain localizing to the lateral epicondyle of the elbow.  No neck pain or radicular symptoms.  Subsequent MRI scan obtained demonstrated complete detachment of the common extensor origin from the epicondyle.  He presents now for operative management after examination risk and benefits.  Patient is otherwise healthy.  No cardiac history or diabetes.  Takes Allegra daily.  He is retired.  He is right-hand dominant.  On examination he lacks about 30 degrees of full extension but has full flexion.  Palpable defect is present between the lateral condyle and the common extensor mass.  This measures about 8 to 9 mm.  EPL FPL interosseous strength is intact.  Patient also has a Popeye deformity in the right arm.  MRI scan demonstrates detachment of the common extensor tendon from the epicondyle with slight retraction.  Partial involvement of the lateral collateral ligament.  Impression is common extensor detachment in an active individual who plays both pickleball and tennis as well as guitar in a band.  Discussed operative and nonoperative treatment options.  Best option for this detached tear would be reattachment with suture anchors augmented by PRP.  The risk and benefits of the procedure discussed with the patient include not limited to infection or vessel damage incomplete healing as well as incomplete restoration of function and inability to return to the same level of activity that he has been used to.  Patient understands risk and benefits and wishes to proceed.  All questions answered.

## 2022-05-10 ENCOUNTER — Encounter: Payer: BC Managed Care – PPO | Admitting: Surgical

## 2022-05-11 ENCOUNTER — Telehealth: Payer: Self-pay | Admitting: Orthopedic Surgery

## 2022-05-11 DIAGNOSIS — M25521 Pain in right elbow: Secondary | ICD-10-CM | POA: Diagnosis not present

## 2022-05-11 NOTE — Telephone Encounter (Signed)
Faxed op note to [facility]

## 2022-05-11 NOTE — Telephone Encounter (Signed)
[facility] called and states the [facility] therapy need pts op report.   CB [phone]

## 2022-05-12 ENCOUNTER — Ambulatory Visit (INDEPENDENT_AMBULATORY_CARE_PROVIDER_SITE_OTHER): Payer: BC Managed Care – PPO | Admitting: Orthopedic Surgery

## 2022-05-12 DIAGNOSIS — M7711 Lateral epicondylitis, right elbow: Secondary | ICD-10-CM

## 2022-05-15 ENCOUNTER — Encounter: Payer: Self-pay | Admitting: Orthopedic Surgery

## 2022-05-15 NOTE — Progress Notes (Signed)
Post-Op Visit Note   Patient: Harry Cobb           Date of Birth: Jul 29, 1960           MRN: [id number] Visit Date: 05/12/2022 PCP: Haywood Pao, MD   Assessment & Plan:  Chief Complaint:  Chief Complaint  Patient presents with   Right Elbow - Routine Post Op   Visit Diagnoses:  1. Lateral epicondylitis of right elbow     Plan: [name] is now 9 days out right elbow extensor tendon mass repair.  His pain is improving.  On exam he has flexion to about 100 and extension lacking about 25 degrees.  Has good grip strength.  Incision intact.  Going to see physical therapy to work on range of motion.  Okay to be out of the sling but no lifting or really any activity with that right arm until follow-up.  Main goals for the next 3 weeks is to achieve full extension.  Follow-Up Instructions: No follow-ups on file.   Orders:  No orders of the defined types were placed in this encounter.  No orders of the defined types were placed in this encounter.   Imaging: No results found.  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    PUD (peptic ulcer disease)    Infection of prosthetic left knee joint ([place]) 06/09/2021   S/P revision of total knee, left 04/28/2021   S/P lumbar fusion 06/23/2020   DDD (degenerative disc disease), cervical 12/13/2019   Lumbar post-laminectomy syndrome 12/13/2019   Degeneration of lumbar intervertebral disc 12/13/2019   Impingement syndrome of left shoulder region 06/23/2018   Pain in joint of left shoulder 04/26/2018   Low back pain 08/24/2017   Knee stiffness, left 08/03/2016   History of total knee replacement, left 08/12/2015   Expected blood loss anemia, postoperative 11/21/2012   Overweight (BMI 25.0-29.9) 11/21/2012   S/P left UKA to TKA  12/15/9483   Umbilical hernia 46/27/0350   Past Medical History:  Diagnosis Date   Arthritis    Back pain    radiates into the left posterior thigh to the calf.   GERD (gastroesophageal reflux disease)    History of kidney stones    Left shoulder pain    Leg pain    Worse than back pain   Lumbar foraminal stenosis    No pertinent past medical history    Radiculopathy, lumbar region     No family history on file.  Past Surgical History:  Procedure Laterality Date   ABDOMINAL EXPOSURE N/A 06/23/2020   Procedure: ABDOMINAL EXPOSURE;  Surgeon: Marty Heck, MD;  Location: [place];  Service: Vascular;  Laterality: N/A;   ANTERIOR LUMBAR FUSION N/A 06/23/2020   Procedure: Anterior Lumbar Interbody Fusion - Lumbar five-Sacral one;  Surgeon: Eustace Moore, MD;  Location: [place];  Service: Neurosurgery;  Laterality: N/A;   BACK SURGERY  2009   BICEPS TENDON REPAIR  2011   EYE SURGERY     Lasik   I & D KNEE WITH POLY EXCHANGE Left 06/09/2021   Procedure: IRRIGATION AND DEBRIDEMENT KNEE WITH POLY REVISION;  Surgeon: Paralee Cancel, MD;  [facility];  Service: Orthopedics;  Laterality: Left;   JOINT REPLACEMENT     both partial knee replacements   KNEE ARTHROSCOPY  11/12   lt   KNEE SURGERY  2009/2011   Rt and Lt knee replacment   LASER SPINE SURGERY     SCAR REVISION Left 08/03/2016   Procedure: Left hip open scar debridement ;  Surgeon: Paralee Cancel, MD;  [facility];  Service: Orthopedics;  Laterality: Left;   shoulder cyst  06/04/11   right cyst removed   TONSILLECTOMY     TOTAL HIP ARTHROPLASTY Left 08/12/2015   Procedure: LEFT TOTAL HIP ARTHROPLASTY ANTERIOR APPROACH;  Surgeon: Paralee Cancel, MD;  [facility];  Service: Orthopedics;  Laterality: Left;   TOTAL KNEE ARTHROPLASTY Left 11/20/2012   Procedure: CONVERT LEFT UNI KNEE ARTHROPLASTY TO LEFT TOTAL KNEE ARTHROPLASTY;  Surgeon: Mauri Pole, MD;  [facility];  Service: Orthopedics;  Laterality: Left;    TOTAL KNEE REVISION Left 04/28/2021   Procedure: TOTAL KNEE REVISION;  Surgeon: Paralee Cancel, MD;  [facility];  Service: Orthopedics;  Laterality: Left;   TOTAL KNEE REVISION WITH SCAR DEBRIDEMENT/PATELLA REVISION WITH POLY EXCHANGE Left 08/03/2016   Procedure: Left knee open scar debridement, poly exchange ;  Surgeon: Paralee Cancel, MD;  [facility];  Service: Orthopedics;  Laterality: Left;   UMBILICAL HERNIA REPAIR  06/04/2011   Procedure: HERNIA REPAIR UMBILICAL ADULT;  Surgeon: Rolm Bookbinder, MD;  Location: [place];  Service: General;  Laterality: N/A;  umbilical hernia repiar and mesh and removal of lympoma on right shoulder   Social History   Occupational History   Not on file  Tobacco Use   Smoking status: Never   Smokeless tobacco: Never  Vaping Use   Vaping Use: Never used  Substance and Sexual Activity   Alcohol use: Yes    Comment: occassionally   Drug use: Never   Sexual activity: Not on file

## 2022-05-17 DIAGNOSIS — M25521 Pain in right elbow: Secondary | ICD-10-CM | POA: Diagnosis not present

## 2022-05-21 DIAGNOSIS — M9907 Segmental and somatic dysfunction of upper extremity: Secondary | ICD-10-CM | POA: Diagnosis not present

## 2022-05-21 DIAGNOSIS — M25521 Pain in right elbow: Secondary | ICD-10-CM | POA: Diagnosis not present

## 2022-05-21 DIAGNOSIS — M9903 Segmental and somatic dysfunction of lumbar region: Secondary | ICD-10-CM | POA: Diagnosis not present

## 2022-05-21 DIAGNOSIS — M9901 Segmental and somatic dysfunction of cervical region: Secondary | ICD-10-CM | POA: Diagnosis not present

## 2022-05-21 DIAGNOSIS — M9902 Segmental and somatic dysfunction of thoracic region: Secondary | ICD-10-CM | POA: Diagnosis not present

## 2022-05-21 DIAGNOSIS — M9905 Segmental and somatic dysfunction of pelvic region: Secondary | ICD-10-CM | POA: Diagnosis not present

## 2022-05-25 DIAGNOSIS — M9907 Segmental and somatic dysfunction of upper extremity: Secondary | ICD-10-CM | POA: Diagnosis not present

## 2022-05-25 DIAGNOSIS — M9902 Segmental and somatic dysfunction of thoracic region: Secondary | ICD-10-CM | POA: Diagnosis not present

## 2022-05-25 DIAGNOSIS — M9903 Segmental and somatic dysfunction of lumbar region: Secondary | ICD-10-CM | POA: Diagnosis not present

## 2022-05-25 DIAGNOSIS — M9901 Segmental and somatic dysfunction of cervical region: Secondary | ICD-10-CM | POA: Diagnosis not present

## 2022-05-25 DIAGNOSIS — M9905 Segmental and somatic dysfunction of pelvic region: Secondary | ICD-10-CM | POA: Diagnosis not present

## 2022-05-26 DIAGNOSIS — M25521 Pain in right elbow: Secondary | ICD-10-CM | POA: Diagnosis not present

## 2022-06-01 DIAGNOSIS — M9903 Segmental and somatic dysfunction of lumbar region: Secondary | ICD-10-CM | POA: Diagnosis not present

## 2022-06-01 DIAGNOSIS — M9905 Segmental and somatic dysfunction of pelvic region: Secondary | ICD-10-CM | POA: Diagnosis not present

## 2022-06-01 DIAGNOSIS — M9902 Segmental and somatic dysfunction of thoracic region: Secondary | ICD-10-CM | POA: Diagnosis not present

## 2022-06-01 DIAGNOSIS — M9907 Segmental and somatic dysfunction of upper extremity: Secondary | ICD-10-CM | POA: Diagnosis not present

## 2022-06-01 DIAGNOSIS — M9901 Segmental and somatic dysfunction of cervical region: Secondary | ICD-10-CM | POA: Diagnosis not present

## 2022-06-02 ENCOUNTER — Ambulatory Visit (INDEPENDENT_AMBULATORY_CARE_PROVIDER_SITE_OTHER): Payer: BC Managed Care – PPO | Admitting: Orthopedic Surgery

## 2022-06-02 DIAGNOSIS — M7711 Lateral epicondylitis, right elbow: Secondary | ICD-10-CM

## 2022-06-05 ENCOUNTER — Encounter: Payer: Self-pay | Admitting: Orthopedic Surgery

## 2022-06-05 NOTE — Progress Notes (Signed)
Post-Op Visit Note   Patient: Harry Cobb           Date of Birth: 12-15-1960           MRN: [id number] Visit Date: 06/02/2022 PCP: Haywood Pao, MD   Assessment & Plan:  Chief Complaint:  Chief Complaint  Patient presents with   Right Elbow - Pain   Visit Diagnoses:  1. Lateral epicondylitis of right elbow     Plan: Harry Cobb is a 61 year old patient with right elbow extensor tendon repair.  Surgery done 05/03/2022.  On examination he has range of motion 8 degrees to about 10 degrees from full flexion.  Pronation supination is full.  Not really having much pain or tenderness to palpation around the elbow.  Extension strength of the fingers and wrist feels good.  Plan at this time is okay for strengthening starting in January with physical therapy.  Come back in 4 weeks for clinical recheck.  We may be able to start transitioning him to some activity at that time but not tennis quite yet.  Follow-Up Instructions: No follow-ups on file.   Orders:  No orders of the defined types were placed in this encounter.  No orders of the defined types were placed in this encounter.   Imaging: No results found.  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    PUD (peptic ulcer disease)    Infection of prosthetic left knee joint ([place]) 06/09/2021   S/P revision of total knee, left 04/28/2021   S/P lumbar fusion 06/23/2020   DDD (degenerative disc disease), cervical 12/13/2019   Lumbar post-laminectomy syndrome 12/13/2019   Degeneration of lumbar intervertebral disc 12/13/2019   Impingement syndrome of left shoulder region 06/23/2018   Pain in joint of left shoulder 04/26/2018   Low back pain 08/24/2017   Knee stiffness, left 08/03/2016   History of total knee replacement, left  08/12/2015   Expected blood loss anemia, postoperative 11/21/2012   Overweight (BMI 25.0-29.9) 11/21/2012   S/P left UKA to TKA 08/67/6195   Umbilical hernia 09/32/6712   Past Medical History:  Diagnosis Date   Arthritis    Back pain    radiates into the left posterior thigh to the calf.   GERD (gastroesophageal reflux disease)    History of kidney stones    Left shoulder pain    Leg pain    Worse than back pain   Lumbar foraminal stenosis    No pertinent past medical history    Radiculopathy, lumbar region     No family history on file.  Past Surgical History:  Procedure Laterality Date   ABDOMINAL EXPOSURE N/A 06/23/2020   Procedure: ABDOMINAL EXPOSURE;  Surgeon: Marty Heck, MD;  Location: [place];  Service: Vascular;  Laterality: N/A;   ANTERIOR LUMBAR FUSION N/A 06/23/2020   Procedure: Anterior Lumbar Interbody Fusion - Lumbar five-Sacral one;  Surgeon: Eustace Moore, MD;  Location: [place];  Service: Neurosurgery;  Laterality: N/A;   BACK SURGERY  2009   BICEPS TENDON REPAIR  2011   EYE SURGERY     Lasik   I & D KNEE WITH POLY EXCHANGE Left 06/09/2021   Procedure: IRRIGATION AND DEBRIDEMENT KNEE WITH POLY REVISION;  Surgeon: Paralee Cancel, MD;  [facility];  Service: Orthopedics;  Laterality: Left;  JOINT REPLACEMENT     both partial knee replacements   KNEE ARTHROSCOPY  11/12   lt   KNEE SURGERY  2009/2011   Rt and Lt knee replacment   LASER SPINE SURGERY     SCAR REVISION Left 08/03/2016   Procedure: Left hip open scar debridement ;  Surgeon: Paralee Cancel, MD;  [facility];  Service: Orthopedics;  Laterality: Left;   shoulder cyst  06/04/11   right cyst removed   TONSILLECTOMY     TOTAL HIP ARTHROPLASTY Left 08/12/2015   Procedure: LEFT TOTAL HIP ARTHROPLASTY ANTERIOR APPROACH;  Surgeon: Paralee Cancel, MD;  [facility];  Service: Orthopedics;  Laterality: Left;   TOTAL KNEE ARTHROPLASTY Left 11/20/2012   Procedure: CONVERT LEFT UNI KNEE  ARTHROPLASTY TO LEFT TOTAL KNEE ARTHROPLASTY;  Surgeon: Mauri Pole, MD;  [facility];  Service: Orthopedics;  Laterality: Left;   TOTAL KNEE REVISION Left 04/28/2021   Procedure: TOTAL KNEE REVISION;  Surgeon: Paralee Cancel, MD;  [facility];  Service: Orthopedics;  Laterality: Left;   TOTAL KNEE REVISION WITH SCAR DEBRIDEMENT/PATELLA REVISION WITH POLY EXCHANGE Left 08/03/2016   Procedure: Left knee open scar debridement, poly exchange ;  Surgeon: Paralee Cancel, MD;  [facility];  Service: Orthopedics;  Laterality: Left;   UMBILICAL HERNIA REPAIR  06/04/2011   Procedure: HERNIA REPAIR UMBILICAL ADULT;  Surgeon: Rolm Bookbinder, MD;  Location: [place];  Service: General;  Laterality: N/A;  umbilical hernia repiar and mesh and removal of lympoma on right shoulder   Social History   Occupational History   Not on file  Tobacco Use   Smoking status: Never   Smokeless tobacco: Never  Vaping Use   Vaping Use: Never used  Substance and Sexual Activity   Alcohol use: Yes    Comment: occassionally   Drug use: Never   Sexual activity: Not on file

## 2022-06-10 DIAGNOSIS — M25521 Pain in right elbow: Secondary | ICD-10-CM | POA: Diagnosis not present

## 2022-06-28 DIAGNOSIS — M25521 Pain in right elbow: Secondary | ICD-10-CM | POA: Diagnosis not present

## 2022-07-05 ENCOUNTER — Encounter: Payer: Self-pay | Admitting: Orthopedic Surgery

## 2022-07-05 ENCOUNTER — Ambulatory Visit (INDEPENDENT_AMBULATORY_CARE_PROVIDER_SITE_OTHER): Payer: BC Managed Care – PPO | Admitting: Orthopedic Surgery

## 2022-07-05 DIAGNOSIS — M7711 Lateral epicondylitis, right elbow: Secondary | ICD-10-CM

## 2022-07-05 NOTE — Progress Notes (Signed)
Post-Op Visit Note   Patient: Harry Cobb           Date of Birth: 1960/09/05           MRN: [id number] Visit Date: 07/05/2022 PCP: Haywood Pao, MD   Assessment & Plan:  Chief Complaint:  Chief Complaint  Patient presents with   Right Elbow - Routine Post Op    right elbow extensor tendon repair.  Surgery done 05/03/2022.   Visit Diagnoses:  1. Lateral epicondylitis of right elbow     Plan: Harry Cobb is a patient who is now 2 months out right elbow extensor mechanism repair to the elbow.  Doing well with no problems and no pain.  Has been doing some pickleball thinking without symptoms.  Tried serving 1 week with pickleball had a small amount of "discomfort" served a week later without problems.  Has not done tennis yet outside of left hand before hand so he can change over to a two-handed backhand when he returns.  He has been able to play the guitar well without difficulty.  On examination lacks about 7 degrees of full extension.  Is doing physical therapy with home exercise program.  Has no real tenderness over the lateral condyle with resisted wrist extension or finger extension.  Grip strength is improving.  Plan at this time is 4-week return.  I think he is good to continue doing pickleball type activities for couple of weeks.  Okay for many tennis in 2 weeks but no one-handed backhand slice or top spin back hands.  I really want him to protect this repair with continued exercise as well as continued cardio for the next 4 weeks.  Come back in 4 weeks for clinical recheck and advancement of activity.  Follow-Up Instructions: No follow-ups on file.   Orders:  No orders of the defined types were placed in this encounter.  No orders of the defined types were placed in this encounter.   Imaging: No results found.  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    PUD (peptic ulcer disease)    Infection of prosthetic left knee joint ([place]) 06/09/2021   S/P revision of total knee, left 04/28/2021   S/P lumbar fusion 06/23/2020   DDD (degenerative disc disease), cervical 12/13/2019   Lumbar post-laminectomy syndrome 12/13/2019   Degeneration of lumbar intervertebral disc 12/13/2019   Impingement syndrome of left shoulder region 06/23/2018   Pain in joint of left shoulder 04/26/2018   Low back pain 08/24/2017   Knee stiffness, left 08/03/2016   History of total knee replacement, left 08/12/2015   Expected blood loss anemia, postoperative 11/21/2012   Overweight (BMI 25.0-29.9) 11/21/2012   S/P left UKA to TKA 49/70/2637   Umbilical hernia 85/88/5027   Past Medical History:  Diagnosis Date   Arthritis    Back pain    radiates into the left posterior thigh to the calf.   GERD (gastroesophageal reflux disease)    History of kidney stones    Left shoulder pain    Leg pain    Worse than back pain   Lumbar foraminal stenosis    No pertinent past medical history    Radiculopathy, lumbar region     No family history on file.  Past Surgical History:  Procedure Laterality Date  ABDOMINAL EXPOSURE N/A 06/23/2020   Procedure: ABDOMINAL EXPOSURE;  Surgeon: Marty Heck, MD;  Location: [facility];  Service: Vascular;  Laterality: N/A;   ANTERIOR LUMBAR FUSION N/A 06/23/2020   Procedure: Anterior Lumbar Interbody Fusion - Lumbar five-Sacral one;  Surgeon: Eustace Moore, MD;  Location: [facility];  Service: Neurosurgery;  Laterality: N/A;   BACK SURGERY  2009   BICEPS TENDON REPAIR  2011   EYE SURGERY     Lasik   I & D KNEE WITH POLY EXCHANGE Left 06/09/2021   Procedure: IRRIGATION AND DEBRIDEMENT KNEE WITH POLY REVISION;  Surgeon: Paralee Cancel, MD;  [facility];  Service: Orthopedics;  Laterality: Left;   JOINT REPLACEMENT     both partial knee  replacements   KNEE ARTHROSCOPY  11/12   lt   KNEE SURGERY  2009/2011   Rt and Lt knee replacment   LASER SPINE SURGERY     SCAR REVISION Left 08/03/2016   Procedure: Left hip open scar debridement ;  Surgeon: Paralee Cancel, MD;  [facility];  Service: Orthopedics;  Laterality: Left;   shoulder cyst  06/04/11   right cyst removed   TONSILLECTOMY     TOTAL HIP ARTHROPLASTY Left 08/12/2015   Procedure: LEFT TOTAL HIP ARTHROPLASTY ANTERIOR APPROACH;  Surgeon: Paralee Cancel, MD;  [facility];  Service: Orthopedics;  Laterality: Left;   TOTAL KNEE ARTHROPLASTY Left 11/20/2012   Procedure: CONVERT LEFT UNI KNEE ARTHROPLASTY TO LEFT TOTAL KNEE ARTHROPLASTY;  Surgeon: Mauri Pole, MD;  [facility];  Service: Orthopedics;  Laterality: Left;   TOTAL KNEE REVISION Left 04/28/2021   Procedure: TOTAL KNEE REVISION;  Surgeon: Paralee Cancel, MD;  [facility];  Service: Orthopedics;  Laterality: Left;   TOTAL KNEE REVISION WITH SCAR DEBRIDEMENT/PATELLA REVISION WITH POLY EXCHANGE Left 08/03/2016   Procedure: Left knee open scar debridement, poly exchange ;  Surgeon: Paralee Cancel, MD;  [facility];  Service: Orthopedics;  Laterality: Left;   UMBILICAL HERNIA REPAIR  06/04/2011   Procedure: HERNIA REPAIR UMBILICAL ADULT;  Surgeon: Rolm Bookbinder, MD;  Location: [facility];  Service: General;  Laterality: N/A;  umbilical hernia repiar and mesh and removal of lympoma on right shoulder   Social History   Occupational History   Not on file  Tobacco Use   Smoking status: Never   Smokeless tobacco: Never  Vaping Use   Vaping Use: Never used  Substance and Sexual Activity   Alcohol use: Yes    Comment: occassionally   Drug use: Never   Sexual activity: Not on file

## 2022-07-12 DIAGNOSIS — M25521 Pain in right elbow: Secondary | ICD-10-CM | POA: Diagnosis not present

## 2022-07-20 ENCOUNTER — Encounter: Payer: Self-pay | Admitting: Orthopedic Surgery

## 2022-08-05 ENCOUNTER — Ambulatory Visit (INDEPENDENT_AMBULATORY_CARE_PROVIDER_SITE_OTHER): Payer: BC Managed Care – PPO | Admitting: Orthopedic Surgery

## 2022-08-05 ENCOUNTER — Encounter: Payer: Self-pay | Admitting: Orthopedic Surgery

## 2022-08-05 DIAGNOSIS — M7711 Lateral epicondylitis, right elbow: Secondary | ICD-10-CM

## 2022-08-05 NOTE — Progress Notes (Signed)
Post-Op Visit Note   Patient: Harry Cobb           Date of Birth: 1961-06-01           MRN: [id number] Visit Date: 08/05/2022 PCP: Harry Pao, MD   Assessment & Plan:  Chief Complaint:  Chief Complaint  Patient presents with   Right Elbow - Routine Post Op    right elbow extensor tendon repair.  Surgery done 05/03/2022.   Visit Diagnoses:  1. Lateral epicondylitis of right elbow     Plan: Harry Cobb is a 62 year old patient is 3 months out from right lateral condyle extensor mass reattachment.  He has been doing well.  On examination he has nearly full range of motion.  Excellent strength with finger extension and wrist extension.  No tenderness over the lateral epicondyle.  He lost his tennis appeal.  Working on a two-handed backhand.  Has been playing some pickle ball without difficulty.  Plan at this time is gradual return to more of the sports he is used to playing.  Would definitely advise against a one-handed backhand drive.  We discussed eccentric strengthening of that extensor mechanism.  He will follow-up as needed.  Continue with cardio exercises to help with the tendon bone interface healing maturation  Follow-Up Instructions: No follow-ups on file.   Orders:  No orders of the defined types were placed in this encounter.  No orders of the defined types were placed in this encounter.   Imaging: No results found.  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    PUD (peptic ulcer disease)    Infection of prosthetic left knee joint ([place]) 06/09/2021   S/P revision of total knee, left 04/28/2021   S/P lumbar fusion 06/23/2020   DDD (degenerative disc disease), cervical 12/13/2019   Lumbar post-laminectomy syndrome 12/13/2019   Degeneration of lumbar  intervertebral disc 12/13/2019   Impingement syndrome of left shoulder region 06/23/2018   Pain in joint of left shoulder 04/26/2018   Low back pain 08/24/2017   Knee stiffness, left 08/03/2016   History of total knee replacement, left 08/12/2015   Expected blood loss anemia, postoperative 11/21/2012   Overweight (BMI 25.0-29.9) 11/21/2012   S/P left UKA to TKA 99991111   Umbilical hernia [id number]   Past Medical History:  Diagnosis Date   Arthritis    Back pain    radiates into the left posterior thigh to the calf.   GERD (gastroesophageal reflux disease)    History of kidney stones    Left shoulder pain    Leg pain    Worse than back pain   Lumbar foraminal stenosis    No pertinent past medical history    Radiculopathy, lumbar region     No family history on file.  Past Surgical History:  Procedure Laterality Date   ABDOMINAL EXPOSURE N/A 06/23/2020   Procedure: ABDOMINAL EXPOSURE;  Surgeon: Marty Heck, MD;  Location: [facility];  Service: Vascular;  Laterality: N/A;   ANTERIOR LUMBAR FUSION N/A 06/23/2020   Procedure: Anterior Lumbar Interbody Fusion - Lumbar five-Sacral one;  Surgeon: Eustace Moore, MD;  Location: [facility];  Service: Neurosurgery;  Laterality: N/A;   BACK SURGERY  2009   BICEPS TENDON REPAIR  2011   EYE SURGERY     Lasik  I & D KNEE WITH POLY EXCHANGE Left 06/09/2021   Procedure: IRRIGATION AND DEBRIDEMENT KNEE WITH POLY REVISION;  Surgeon: Paralee Cancel, MD;  [facility];  Service: Orthopedics;  Laterality: Left;   JOINT REPLACEMENT     both partial knee replacements   KNEE ARTHROSCOPY  11/12   lt   KNEE SURGERY  2009/2011   Rt and Lt knee replacment   LASER SPINE SURGERY     SCAR REVISION Left 08/03/2016   Procedure: Left hip open scar debridement ;  Surgeon: Paralee Cancel, MD;  [facility];  Service: Orthopedics;  Laterality: Left;   shoulder cyst  06/04/11   right cyst removed   TONSILLECTOMY     TOTAL HIP ARTHROPLASTY Left  08/12/2015   Procedure: LEFT TOTAL HIP ARTHROPLASTY ANTERIOR APPROACH;  Surgeon: Paralee Cancel, MD;  [facility];  Service: Orthopedics;  Laterality: Left;   TOTAL KNEE ARTHROPLASTY Left 11/20/2012   Procedure: CONVERT LEFT UNI KNEE ARTHROPLASTY TO LEFT TOTAL KNEE ARTHROPLASTY;  Surgeon: Mauri Pole, MD;  [facility];  Service: Orthopedics;  Laterality: Left;   TOTAL KNEE REVISION Left 04/28/2021   Procedure: TOTAL KNEE REVISION;  Surgeon: Paralee Cancel, MD;  [facility];  Service: Orthopedics;  Laterality: Left;   TOTAL KNEE REVISION WITH SCAR DEBRIDEMENT/PATELLA REVISION WITH POLY EXCHANGE Left 08/03/2016   Procedure: Left knee open scar debridement, poly exchange ;  Surgeon: Paralee Cancel, MD;  [facility];  Service: Orthopedics;  Laterality: Left;   UMBILICAL HERNIA REPAIR  06/04/2011   Procedure: HERNIA REPAIR UMBILICAL ADULT;  Surgeon: Rolm Bookbinder, MD;  Location: [facility];  Service: General;  Laterality: N/A;  umbilical hernia repiar and mesh and removal of lympoma on right shoulder   Social History   Occupational History   Not on file  Tobacco Use   Smoking status: Never   Smokeless tobacco: Never  Vaping Use   Vaping Use: Never used  Substance and Sexual Activity   Alcohol use: Yes    Comment: occassionally   Drug use: Never   Sexual activity: Not on file

## 2022-08-27 DIAGNOSIS — E7800 Pure hypercholesterolemia, unspecified: Secondary | ICD-10-CM | POA: Diagnosis not present

## 2022-08-27 DIAGNOSIS — Z125 Encounter for screening for malignant neoplasm of prostate: Secondary | ICD-10-CM | POA: Diagnosis not present

## 2022-08-27 DIAGNOSIS — K219 Gastro-esophageal reflux disease without esophagitis: Secondary | ICD-10-CM | POA: Diagnosis not present

## 2022-09-01 DIAGNOSIS — K219 Gastro-esophageal reflux disease without esophagitis: Secondary | ICD-10-CM | POA: Diagnosis not present

## 2022-09-01 DIAGNOSIS — Z1331 Encounter for screening for depression: Secondary | ICD-10-CM | POA: Diagnosis not present

## 2022-09-01 DIAGNOSIS — Z1339 Encounter for screening examination for other mental health and behavioral disorders: Secondary | ICD-10-CM | POA: Diagnosis not present

## 2022-09-01 DIAGNOSIS — J302 Other seasonal allergic rhinitis: Secondary | ICD-10-CM | POA: Diagnosis not present

## 2022-09-01 DIAGNOSIS — M1990 Unspecified osteoarthritis, unspecified site: Secondary | ICD-10-CM | POA: Diagnosis not present

## 2022-09-01 DIAGNOSIS — Z0000 Encounter for general adult medical examination without abnormal findings: Secondary | ICD-10-CM | POA: Diagnosis not present

## 2022-09-01 DIAGNOSIS — R82998 Other abnormal findings in urine: Secondary | ICD-10-CM | POA: Diagnosis not present

## 2022-09-01 DIAGNOSIS — E7800 Pure hypercholesterolemia, unspecified: Secondary | ICD-10-CM | POA: Diagnosis not present

## 2022-09-13 DIAGNOSIS — M79661 Pain in right lower leg: Secondary | ICD-10-CM | POA: Diagnosis not present

## 2022-09-13 DIAGNOSIS — S86112A Strain of other muscle(s) and tendon(s) of posterior muscle group at lower leg level, left leg, initial encounter: Secondary | ICD-10-CM | POA: Diagnosis not present

## 2022-09-20 DIAGNOSIS — Z1152 Encounter for screening for COVID-19: Secondary | ICD-10-CM | POA: Diagnosis not present

## 2022-09-20 DIAGNOSIS — R0981 Nasal congestion: Secondary | ICD-10-CM | POA: Diagnosis not present

## 2022-09-20 DIAGNOSIS — J302 Other seasonal allergic rhinitis: Secondary | ICD-10-CM | POA: Diagnosis not present

## 2022-09-20 DIAGNOSIS — R5383 Other fatigue: Secondary | ICD-10-CM | POA: Diagnosis not present

## 2022-09-20 DIAGNOSIS — R059 Cough, unspecified: Secondary | ICD-10-CM | POA: Diagnosis not present

## 2022-09-20 DIAGNOSIS — J069 Acute upper respiratory infection, unspecified: Secondary | ICD-10-CM | POA: Diagnosis not present

## 2022-10-04 DIAGNOSIS — R058 Other specified cough: Secondary | ICD-10-CM | POA: Diagnosis not present

## 2022-10-04 DIAGNOSIS — R0981 Nasal congestion: Secondary | ICD-10-CM | POA: Diagnosis not present

## 2022-10-04 DIAGNOSIS — J302 Other seasonal allergic rhinitis: Secondary | ICD-10-CM | POA: Diagnosis not present

## 2022-10-04 DIAGNOSIS — R5383 Other fatigue: Secondary | ICD-10-CM | POA: Diagnosis not present

## 2022-10-04 DIAGNOSIS — Z1152 Encounter for screening for COVID-19: Secondary | ICD-10-CM | POA: Diagnosis not present

## 2022-10-04 DIAGNOSIS — J029 Acute pharyngitis, unspecified: Secondary | ICD-10-CM | POA: Diagnosis not present

## 2022-10-19 DIAGNOSIS — D485 Neoplasm of uncertain behavior of skin: Secondary | ICD-10-CM | POA: Diagnosis not present

## 2022-10-19 DIAGNOSIS — L820 Inflamed seborrheic keratosis: Secondary | ICD-10-CM | POA: Diagnosis not present

## 2022-10-26 DIAGNOSIS — Z6829 Body mass index (BMI) 29.0-29.9, adult: Secondary | ICD-10-CM | POA: Diagnosis not present

## 2022-10-26 DIAGNOSIS — M5412 Radiculopathy, cervical region: Secondary | ICD-10-CM | POA: Diagnosis not present

## 2022-10-28 DIAGNOSIS — M79642 Pain in left hand: Secondary | ICD-10-CM | POA: Diagnosis not present

## 2022-12-03 ENCOUNTER — Other Ambulatory Visit (INDEPENDENT_AMBULATORY_CARE_PROVIDER_SITE_OTHER): Payer: BC Managed Care – PPO

## 2022-12-03 ENCOUNTER — Ambulatory Visit (INDEPENDENT_AMBULATORY_CARE_PROVIDER_SITE_OTHER): Payer: BC Managed Care – PPO | Admitting: Orthopedic Surgery

## 2022-12-03 DIAGNOSIS — M25562 Pain in left knee: Secondary | ICD-10-CM | POA: Diagnosis not present

## 2022-12-04 ENCOUNTER — Encounter: Payer: Self-pay | Admitting: Orthopedic Surgery

## 2022-12-04 NOTE — Progress Notes (Signed)
Office Visit Note   Patient: Harry Cobb           Date of Birth: 1960/12/08           MRN: [id number] Visit Date: 12/03/2022 Requested by: Gaspar Garbe, MD [address] [place],  [place] [address] PCP: Wylene Simmer Adelfa Koh, MD  Subjective: Chief Complaint  Patient presents with   Left Knee - Pain    HPI: Harry Cobb is a 62 y.o. male who presents to the office reporting left knee pain.  Patient is having some anterior and suprapatellar pain in that left knee.  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  He had left total knee replacement in November 2022 which was complicated by arthrofibrosis and infection which required washout and polyethylene exchange.  That revision was done in January 2023.  Patient denies any fevers or chills.  He states that after his index left knee replacement that he had 2 problems with the knee which was primarily flexion as well as the superior lateral pain around the patella.  He had the symptoms before he developed the infection.  His anterior knee pain is worse at the end of the day.  He reports significant improvement in his pain when he takes a prednisone pack for unrelated reasons.  When he walks around he does not have any symptoms.  He has tried a multitude of modalities including dry needling ultrasound and stretching.  He does report some pain after standing particularly when he is loading the need to play the guitar..                ROS: All systems reviewed are negative as they relate to the chief complaint within the history of present illness.  Patient denies fevers or chills.  Assessment & Plan: Visit Diagnoses:  1. Left knee pain, unspecified chronicity     Plan: Impression is left knee superior lateral patellar pain which could be related either to early AVN of the patella versus patellar tendon tendinosis associated with ossicle formation around the superolateral aspect of the patella.  I think the latter is more  likely.  Patient has trace effusion in the knee today but no clinical evidence of infection.  Nothing really actionable in the knee at this time.  He has had 2 medium parapatellar arthrotomy so some tightness around the extensor mechanism is to be expected.  Some of the pain he is having is likely related to some of the ossicle formation within the tendon which does not appear to be at risk of rupture at this time based on exam.  Nonetheless I do think he needs to be careful with his high exertion activities such as tennis.  He will follow-up as needed.  Follow-Up Instructions: No follow-ups on file.   Orders:  Orders Placed This Encounter  Procedures   XR KNEE 3 VIEW LEFT   No orders of the defined types were placed in this encounter.     Procedures: No procedures performed   Clinical Data: No additional findings.  Objective: Vital Signs: There were no vitals taken for this visit.  Physical Exam:  Constitutional: Patient appears well-developed HEENT:  Head: Normocephalic Eyes:EOM are normal Neck: Normal range of motion Cardiovascular: Normal rate Pulmonary/chest: Effort normal Neurologic: Patient is alert Skin: Skin is warm Psychiatric: Patient has normal mood and affect  Ortho Exam: Ortho exam demonstrates left knee that has range of motion of 0-100.  Very good anterior  posterior stability as well as collateral ligament stability at 0 30 and 90 degrees.  Extensor mechanism is intact.  Trace effusion is present.  No warmth to the left knee.  Does have some superior lateral patellar tenderness but no patellar apprehension and no increased medial or lateral mobility of the patella in extension.  Specialty Comments:  No specialty comments available.  Imaging: No results found.   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    PUD (peptic ulcer disease)    Infection of prosthetic left knee joint ([facility]) 06/09/2021   S/P revision of total knee, left 04/28/2021   S/P lumbar fusion 06/23/2020   DDD (degenerative disc disease), cervical 12/13/2019   Lumbar post-laminectomy syndrome 12/13/2019   Degeneration of lumbar intervertebral disc 12/13/2019   Impingement syndrome of left shoulder region 06/23/2018   Pain in joint of left shoulder 04/26/2018   Low back pain 08/24/2017   Knee stiffness, left 08/03/2016   History of total knee replacement, left 08/12/2015   Expected blood loss anemia, postoperative 11/21/2012   Overweight (BMI 25.0-29.9) 11/21/2012   S/P left UKA to TKA 11/20/2012   Umbilical hernia 12/24/2010   Past Medical History:  Diagnosis Date   Arthritis    Back pain    radiates into the left posterior thigh to the calf.   GERD (gastroesophageal reflux disease)    History of kidney stones    Left shoulder pain    Leg pain    Worse than back pain   Lumbar foraminal stenosis    No pertinent past medical history    Radiculopathy, lumbar region     No family history on file.  Past Surgical History:  Procedure Laterality Date   ABDOMINAL EXPOSURE N/A 06/23/2020   Procedure: ABDOMINAL EXPOSURE;  Surgeon: Cephus Shelling, MD;  Location: [facility];  Service: Vascular;  Laterality: N/A;   ANTERIOR LUMBAR FUSION N/A 06/23/2020   Procedure: Anterior Lumbar Interbody Fusion - Lumbar five-Sacral one;  Surgeon: Tia Alert, MD;  Location[facility];  Service: Neurosurgery;  Laterality: N/A;   BACK SURGERY  2009   BICEPS TENDON REPAIR  2011   EYE SURGERY     Lasik   I & D KNEE WITH POLY EXCHANGE Left 06/09/2021   Procedure: IRRIGATION AND DEBRIDEMENT KNEE WITH POLY REVISION;  Surgeon: Durene Romans, MD;  [facility];  Service: Orthopedics;  Laterality: Left;   JOINT REPLACEMENT     both partial knee replacements   KNEE ARTHROSCOPY  11/12   lt    KNEE SURGERY  2009/2011   Rt and Lt knee replacment   LASER SPINE SURGERY     SCAR REVISION Left 08/03/2016   Procedure: Left hip open scar debridement ;  Surgeon: Durene Romans, MD;  [facility];  Service: Orthopedics;  Laterality: Left;   shoulder cyst  06/04/11   right cyst removed   TONSILLECTOMY     TOTAL HIP ARTHROPLASTY Left 08/12/2015   Procedure: LEFT TOTAL HIP ARTHROPLASTY ANTERIOR APPROACH;  Surgeon: Durene Romans, MD;  [facility];  Service: Orthopedics;  Laterality: Left;   TOTAL KNEE ARTHROPLASTY Left 11/20/2012   Procedure: CONVERT LEFT UNI KNEE ARTHROPLASTY TO LEFT TOTAL KNEE ARTHROPLASTY;  Surgeon: Shelda Pal, MD;  [facility];  Service: Orthopedics;  Laterality: Left;  TOTAL KNEE REVISION Left 04/28/2021   Procedure: TOTAL KNEE REVISION;  Surgeon: Durene Romans, MD;  [facility];  Service: Orthopedics;  Laterality: Left;   TOTAL KNEE REVISION WITH SCAR DEBRIDEMENT/PATELLA REVISION WITH POLY EXCHANGE Left 08/03/2016   Procedure: Left knee open scar debridement, poly exchange ;  Surgeon: Durene Romans, MD;  [facility];  Service: Orthopedics;  Laterality: Left;   UMBILICAL HERNIA REPAIR  06/04/2011   Procedure: HERNIA REPAIR UMBILICAL ADULT;  Surgeon: Emelia Loron, MD;  Location: [facility];  Service: General;  Laterality: N/A;  umbilical hernia repiar and mesh and removal of lympoma on right shoulder   Social History   Occupational History   Not on file  Tobacco Use   Smoking status: Never   Smokeless tobacco: Never  Vaping Use   Vaping Use: Never used  Substance and Sexual Activity   Alcohol use: Yes    Comment: occassionally   Drug use: Never   Sexual activity: Not on file

## 2022-12-22 DIAGNOSIS — M47816 Spondylosis without myelopathy or radiculopathy, lumbar region: Secondary | ICD-10-CM | POA: Diagnosis not present

## 2022-12-22 DIAGNOSIS — M25552 Pain in left hip: Secondary | ICD-10-CM | POA: Diagnosis not present

## 2022-12-22 DIAGNOSIS — S39012A Strain of muscle, fascia and tendon of lower back, initial encounter: Secondary | ICD-10-CM | POA: Diagnosis not present

## 2022-12-28 DIAGNOSIS — M5416 Radiculopathy, lumbar region: Secondary | ICD-10-CM | POA: Diagnosis not present

## 2023-04-07 DIAGNOSIS — Z6829 Body mass index (BMI) 29.0-29.9, adult: Secondary | ICD-10-CM | POA: Diagnosis not present

## 2023-04-07 DIAGNOSIS — M5412 Radiculopathy, cervical region: Secondary | ICD-10-CM | POA: Diagnosis not present

## 2023-04-11 DIAGNOSIS — J0100 Acute maxillary sinusitis, unspecified: Secondary | ICD-10-CM | POA: Diagnosis not present

## 2023-04-11 DIAGNOSIS — J3489 Other specified disorders of nose and nasal sinuses: Secondary | ICD-10-CM | POA: Diagnosis not present

## 2023-04-11 DIAGNOSIS — J302 Other seasonal allergic rhinitis: Secondary | ICD-10-CM | POA: Diagnosis not present

## 2023-04-14 ENCOUNTER — Telehealth: Payer: Self-pay | Admitting: Physical Medicine and Rehabilitation

## 2023-04-14 ENCOUNTER — Telehealth: Payer: Self-pay

## 2023-04-14 DIAGNOSIS — R202 Paresthesia of skin: Secondary | ICD-10-CM

## 2023-04-14 NOTE — Telephone Encounter (Signed)
-----   Message from [facility] sent at 04/13/2023 11:19 AM EDT -----  ----- Message ----- From: Dorcas Mcmurray Sent: 04/13/2023  11:15 AM EDT To: Sharlet Salina, CMA

## 2023-04-14 NOTE — Telephone Encounter (Signed)
Spoke with patient and scheduled NCV for 04/20/23.

## 2023-04-14 NOTE — Telephone Encounter (Signed)
-----   Message from [place], [place] E sent at 04/13/2023  1:14 PM EDT ----- Ok to schedule bilateral upper extremity NCV with EMG. ----- Message ----- From: Sharlet Salina, CMA Sent: 04/13/2023  11:19 AM EDT To: Tyrell Antonio, MD; Juanda Chance, NP   ----- Message ----- From: Dorcas Mcmurray Sent: 04/13/2023  11:15 AM EDT To: Sharlet Salina, CMA

## 2023-04-20 ENCOUNTER — Encounter: Payer: BC Managed Care – PPO | Admitting: Physical Medicine and Rehabilitation

## 2023-04-22 ENCOUNTER — Encounter: Payer: BC Managed Care – PPO | Admitting: Physical Medicine and Rehabilitation

## 2023-04-26 ENCOUNTER — Ambulatory Visit (INDEPENDENT_AMBULATORY_CARE_PROVIDER_SITE_OTHER): Payer: BC Managed Care – PPO | Admitting: Physical Medicine and Rehabilitation

## 2023-04-26 DIAGNOSIS — R531 Weakness: Secondary | ICD-10-CM

## 2023-04-26 DIAGNOSIS — M542 Cervicalgia: Secondary | ICD-10-CM | POA: Diagnosis not present

## 2023-04-26 DIAGNOSIS — R202 Paresthesia of skin: Secondary | ICD-10-CM

## 2023-04-26 DIAGNOSIS — M79642 Pain in left hand: Secondary | ICD-10-CM

## 2023-04-26 DIAGNOSIS — M79641 Pain in right hand: Secondary | ICD-10-CM | POA: Diagnosis not present

## 2023-04-26 NOTE — Progress Notes (Signed)
Patient is here for Bil NCS. Having Numbness and some tingling. Worse on the left hand. Does not wear wrist braces. No previous injections.  Had MRI- Neck done last week at [facility]. Plays guitar and tennis.

## 2023-05-01 ENCOUNTER — Encounter: Payer: Self-pay | Admitting: Physical Medicine and Rehabilitation

## 2023-05-01 NOTE — Progress Notes (Unsigned)
Harry Cobb - 62 y.o. male MRN [id number]  Date of birth: 1960-09-30  Office Visit Note: Visit Date: 04/26/2023 PCP: Gaspar Garbe, MD Referred by: Lavada Mesi, MD  Subjective: Chief Complaint  Patient presents with   Left Hand - Numbness   Right Hand - Numbness   HPI: Harry Cobb is a 62 y.o. male who comes in todayHPI   ROS Otherwise per HPI.  Assessment & Plan: Visit Diagnoses:    ICD-10-CM   1. Paresthesia of skin  R20.2 NCV with EMG (electromyography)       Plan: Impression: The above electrodiagnostic study is ABNORMAL and reveals evidence of: a severe left median nerve entrapment at the wrist (carpal tunnel syndrome) affecting sensory and motor components.   a mild right median nerve entrapment at the wrist (carpal tunnel syndrome) affecting sensory components.  There is no significant electrodiagnostic evidence of any other focal nerve entrapment, brachial plexopathy or cervical radiculopathy.   Recommendations: 1.  Follow-up with referring physician. 2.  Continue current management of symptoms. 3.  Continue use of resting splint at night-time and as needed during the day. 4.  Suggest surgical evaluation.  Meds & Orders: No orders of the defined types were placed in this encounter.   Orders Placed This Encounter  Procedures   NCV with EMG (electromyography)    Follow-up: No follow-ups on file.   Procedures: No procedures performed  EMG & NCV Findings: Evaluation of the left median motor nerve showed prolonged distal onset latency (4.4 ms) and decreased conduction velocity (Elbow-Wrist, 47 m/s).  The right median motor nerve showed decreased conduction velocity (Elbow-Wrist, 47 m/s).  The left median (across palm) sensory and the right median (across palm) sensory nerves showed prolonged distal peak latency (Wrist, L4.2, R4.1 ms) and prolonged distal peak latency (Palm, L2.3, R2.1 ms).  The left ulnar sensory nerve showed reduced amplitude (1.0  V).  All remaining nerves (as indicated in the following tables) were within normal limits.  All left vs. right side differences were within normal limits.    All examined muscles (as indicated in the following table) showed no evidence of electrical instability.    Impression: The above electrodiagnostic study is ABNORMAL and reveals evidence of: a severe left median nerve entrapment at the wrist (carpal tunnel syndrome) affecting sensory and motor components.   a mild right median nerve entrapment at the wrist (carpal tunnel syndrome) affecting sensory components.  There is no significant electrodiagnostic evidence of any other focal nerve entrapment, brachial plexopathy or cervical radiculopathy.   Recommendations: 1.  Follow-up with referring physician. 2.  Continue current management of symptoms. 3.  Continue use of resting splint at night-time and as needed during the day. 4.  Suggest surgical evaluation.  ___________________________ Naaman Plummer FAAPMR Board Certified, American Board of Physical Medicine and Rehabilitation    Nerve Conduction Studies Anti Sensory Summary Table   Stim Site NR Peak (ms) Norm Peak (ms) P-T Amp (V) Norm P-T Amp Site1 Site2 Delta-P (ms) Dist (cm) Vel (m/s) Norm Vel (m/s)  Left Median Acr Palm Anti Sensory (2nd Digit)  33C  Wrist    *4.2 <3.6 17.6 >10 Wrist Palm 1.9 0.0    Palm    *2.3 <2.0 22.8         Right Median Acr Palm Anti Sensory (2nd Digit)  32.4C  Wrist    *4.1 <3.6 15.9 >10 Wrist Palm 2.0 0.0    Palm    *2.1 <2.0 17.9  Left Radial Anti Sensory (Base 1st Digit)  32.1C  Wrist    2.0 <3.1 22.4  Wrist Base 1st Digit 2.0 0.0    Left Ulnar Anti Sensory (5th Digit)  32.9C  Wrist    3.4 <3.7 *1.0 >15.0 Wrist 5th Digit 3.4 14.0 41 >38   Motor Summary Table   Stim Site NR Onset (ms) Norm Onset (ms) O-P Amp (mV) Norm O-P Amp Site1 Site2 Delta-0 (ms) Dist (cm) Vel (m/s) Norm Vel (m/s)  Left Median Motor (Abd Poll Brev)  32.4C   Wrist    *4.4 <4.2 5.2 >5 Elbow Wrist 4.9 23.0 *47 >50  Elbow    9.3  2.2         Right Median Motor (Abd Poll Brev)  32.5C  Wrist    4.0 <4.2 6.0 >5 Elbow Wrist 5.0 23.5 *47 >50  Elbow    9.0  5.5         Left Ulnar Motor (Abd Dig Min)  32.7C  Wrist    2.5 <4.2 7.6 >3 B Elbow Wrist 4.0 21.0 53 >53  B Elbow    6.5  4.2  A Elbow B Elbow 1.9 10.0 53 >53  A Elbow    8.4  4.0          EMG   Side Muscle Nerve Root Ins Act Fibs Psw Amp Dur Poly Recrt Int Dennie Bible Comment  Right Abd Poll Brev Median C8-T1 Nml Nml Nml Nml Nml 0 Nml Nml   Right 1stDorInt Ulnar C8-T1 Nml Nml Nml Nml Nml 0 Nml Nml   Right PronatorTeres Median C6-7 Nml Nml Nml Nml Nml 0 Nml Nml   Right Biceps Musculocut C5-6 Nml Nml Nml Nml Nml 0 Nml Nml   Right Deltoid Axillary C5-6 Nml Nml Nml Nml Nml 0 Nml Nml   Left 1stDorInt Ulnar C8-T1 Nml Nml Nml Nml Nml 0 Nml Nml   Left Abd Poll Brev Median C8-T1 Nml Nml Nml Nml Nml 0 Nml Nml   Left ExtDigCom   Nml Nml Nml Nml Nml 0 Nml Nml   Left Triceps Radial C6-7-8 Nml Nml Nml Nml Nml 0 Nml Nml   Left Deltoid Axillary C5-6 Nml Nml Nml Nml Nml 0 Nml Nml     Nerve Conduction Studies Anti Sensory Left/Right Comparison   Stim Site L Lat (ms) R Lat (ms) L-R Lat (ms) L Amp (V) R Amp (V) L-R Amp (%) Site1 Site2 L Vel (m/s) R Vel (m/s) L-R Vel (m/s)  Median Acr Palm Anti Sensory (2nd Digit)  33C  Wrist *4.2 *4.1 0.1 17.6 15.9 9.7 Wrist Palm     Palm *2.3 *2.1 0.2 22.8 17.9 21.5       Radial Anti Sensory (Base 1st Digit)  32.1C  Wrist 2.0   22.4   Wrist Base 1st Digit     Ulnar Anti Sensory (5th Digit)  32.9C  Wrist 3.4   *1.0   Wrist 5th Digit 41     Motor Left/Right Comparison   Stim Site L Lat (ms) R Lat (ms) L-R Lat (ms) L Amp (mV) R Amp (mV) L-R Amp (%) Site1 Site2 L Vel (m/s) R Vel (m/s) L-R Vel (m/s)  Median Motor (Abd Poll Brev)  32.4C  Wrist *4.4 4.0 0.4 5.2 6.0 13.3 Elbow Wrist *47 *47 0  Elbow 9.3 9.0 0.3 2.2 5.5 60.0       Ulnar Motor (Abd Dig Min)  32.7C  Wrist  2.5   7.6   B  Elbow Wrist 53    B Elbow 6.5   4.2   A Elbow B Elbow 53    A Elbow 8.4   4.0            Waveforms:                 Clinical History: No specialty comments available.   He reports that he has never smoked. He has never used smokeless tobacco. No results for input(s): "HGBA1C", "LABURIC" in the last 8760 hours.  Objective:  VS:  HT:    WT:   BMI:     BP:   HR: bpm  TEMP: ( )  RESP:  Physical Exam  Ortho Exam  Imaging: No results found.  Past Medical/Family/Surgical/Social History: Medications & Allergies reviewed per EMR, new medications updated. 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    PUD (peptic ulcer disease)    Infection of prosthetic left knee joint (HCC) 06/09/2021   S/P revision of total knee, left 04/28/2021   S/P lumbar fusion 06/23/2020   DDD (degenerative disc disease), cervical 12/13/2019   Lumbar post-laminectomy syndrome 12/13/2019   Degeneration of lumbar intervertebral disc 12/13/2019   Impingement syndrome of left shoulder region 06/23/2018   Pain in joint of left shoulder 04/26/2018   Low back pain 08/24/2017   Knee stiffness, left 08/03/2016   History of total knee replacement, left 08/12/2015   Expected blood loss anemia, postoperative 11/21/2012   Overweight (BMI 25.0-29.9) 11/21/2012   S/P left UKA to TKA 11/20/2012   Umbilical hernia 12/24/2010   Past Medical History:  Diagnosis Date   Arthritis    Back pain    radiates into the left posterior thigh to the calf.   GERD (gastroesophageal reflux disease)    History of kidney stones    Left shoulder pain    Leg pain    Worse than back pain   Lumbar foraminal stenosis    No pertinent past medical history    Radiculopathy, lumbar region    No family history on file. Past  Surgical History:  Procedure Laterality Date   ABDOMINAL EXPOSURE N/A 06/23/2020   Procedure: ABDOMINAL EXPOSURE;  Surgeon: Cephus Shelling, MD;  Location: [facility] OR;  Service: Vascular;  Laterality: N/A;   ANTERIOR LUMBAR FUSION N/A 06/23/2020   Procedure: Anterior Lumbar Interbody Fusion - Lumbar five-Sacral one;  Surgeon: Tia Alert, MD;  Location: [facility] OR;  Service: Neurosurgery;  Laterality: N/A;   BACK SURGERY  2009   BICEPS TENDON REPAIR  2011   EYE SURGERY     Lasik   I & D KNEE WITH POLY EXCHANGE Left 06/09/2021   Procedure: IRRIGATION AND DEBRIDEMENT KNEE WITH POLY REVISION;  Surgeon: Durene Romans, MD;  Location: [facility] ORS;  Service: Orthopedics;  Laterality: Left;   JOINT REPLACEMENT     both partial knee replacements   KNEE ARTHROSCOPY  11/12   lt   KNEE SURGERY  2009/2011   Rt and Lt knee replacment   LASER SPINE SURGERY     SCAR REVISION Left 08/03/2016   Procedure: Left hip open scar debridement ;  Surgeon: Durene Romans, MD;  Location: [facility] ORS;  Service: Orthopedics;  Laterality: Left;   shoulder cyst  06/04/11   right cyst removed   TONSILLECTOMY     TOTAL  HIP ARTHROPLASTY Left 08/12/2015   Procedure: LEFT TOTAL HIP ARTHROPLASTY ANTERIOR APPROACH;  Surgeon: Durene Romans, MD;  Location: [facility] ORS;  Service: Orthopedics;  Laterality: Left;   TOTAL KNEE ARTHROPLASTY Left 11/20/2012   Procedure: CONVERT LEFT UNI KNEE ARTHROPLASTY TO LEFT TOTAL KNEE ARTHROPLASTY;  Surgeon: Shelda Pal, MD;  Location: [facility] ORS;  Service: Orthopedics;  Laterality: Left;   TOTAL KNEE REVISION Left 04/28/2021   Procedure: TOTAL KNEE REVISION;  Surgeon: Durene Romans, MD;  Location: [facility] OR[facility];  Service: Orthopedics;  Laterality: Left;   TOTAL KNEE REVISION WITH SCAR DEBRIDEMENT/PATELLA REVISION WITH POLY EXCHANGE Left 08/03/2016   Procedure: Left knee open scar debridement, poly exchange ;  Surgeon: Durene Romans, MD;  Location: [facility] OR[facility];  Service: Orthopedics;  Laterality: Left;   UMBILICAL HERNIA REPAIR   06/04/2011   Procedure: HERNIA REPAIR UMBILICAL ADULT;  Surgeon: Emelia Loron, MD;  Location: [facility];  Service: General;  Laterality: N/A;  umbilical hernia repiar and mesh and removal of lympoma on right shoulder   Social History   Occupational History   Not on file  Tobacco Use   Smoking status: Never   Smokeless tobacco: Never  Vaping Use   Vaping status: Never Used  Substance and Sexual Activity   Alcohol use: Yes    Comment: occassionally   Drug use: Never   Sexual activity: Not on file

## 2023-05-01 NOTE — Procedures (Unsigned)
EMG & NCV Findings: Evaluation of the left median motor nerve showed prolonged distal onset latency (4.4 ms) and decreased conduction velocity (Elbow-Wrist, 47 m/s).  The right median motor nerve showed decreased conduction velocity (Elbow-Wrist, 47 m/s).  The left median (across palm) sensory and the right median (across palm) sensory nerves showed prolonged distal peak latency (Wrist, L4.2, R4.1 ms) and prolonged distal peak latency (Palm, L2.3, R2.1 ms).  The left ulnar sensory nerve showed reduced amplitude (1.0 V).  All remaining nerves (as indicated in the following tables) were within normal limits.  All left vs. right side differences were within normal limits.    All examined muscles (as indicated in the following table) showed no evidence of electrical instability.    Impression: The above electrodiagnostic study is ABNORMAL and reveals evidence of: a severe left median nerve entrapment at the wrist (carpal tunnel syndrome) affecting sensory and motor components.   a mild right median nerve entrapment at the wrist (carpal tunnel syndrome) affecting sensory components.  There is no significant electrodiagnostic evidence of any other focal nerve entrapment, brachial plexopathy or cervical radiculopathy.   Recommendations: 1.  Follow-up with referring physician. 2.  Continue current management of symptoms. 3.  Continue use of resting splint at night-time and as needed during the day. 4.  Suggest surgical evaluation.  ___________________________ Naaman Plummer FAAPMR Board Certified, American Board of Physical Medicine and Rehabilitation    Nerve Conduction Studies Anti Sensory Summary Table   Stim Site NR Peak (ms) Norm Peak (ms) P-T Amp (V) Norm P-T Amp Site1 Site2 Delta-P (ms) Dist (cm) Vel (m/s) Norm Vel (m/s)  Left Median Acr Palm Anti Sensory (2nd Digit)  33C  Wrist    *4.2 <3.6 17.6 >10 Wrist Palm 1.9 0.0    Palm    *2.3 <2.0 22.8         Right Median Acr Palm Anti  Sensory (2nd Digit)  32.4C  Wrist    *4.1 <3.6 15.9 >10 Wrist Palm 2.0 0.0    Palm    *2.1 <2.0 17.9         Left Radial Anti Sensory (Base 1st Digit)  32.1C  Wrist    2.0 <3.1 22.4  Wrist Base 1st Digit 2.0 0.0    Left Ulnar Anti Sensory (5th Digit)  32.9C  Wrist    3.4 <3.7 *1.0 >15.0 Wrist 5th Digit 3.4 14.0 41 >38   Motor Summary Table   Stim Site NR Onset (ms) Norm Onset (ms) O-P Amp (mV) Norm O-P Amp Site1 Site2 Delta-0 (ms) Dist (cm) Vel (m/s) Norm Vel (m/s)  Left Median Motor (Abd Poll Brev)  32.4C  Wrist    *4.4 <4.2 5.2 >5 Elbow Wrist 4.9 23.0 *47 >50  Elbow    9.3  2.2         Right Median Motor (Abd Poll Brev)  32.5C  Wrist    4.0 <4.2 6.0 >5 Elbow Wrist 5.0 23.5 *47 >50  Elbow    9.0  5.5         Left Ulnar Motor (Abd Dig Min)  32.7C  Wrist    2.5 <4.2 7.6 >3 B Elbow Wrist 4.0 21.0 53 >53  B Elbow    6.5  4.2  A Elbow B Elbow 1.9 10.0 53 >53  A Elbow    8.4  4.0          EMG   Side Muscle Nerve Root Ins Act Fibs Psw Amp Dur Poly Recrt Int  Pat Comment  Right Abd Poll Brev Median C8-T1 Nml Nml Nml Nml Nml 0 Nml Nml   Right 1stDorInt Ulnar C8-T1 Nml Nml Nml Nml Nml 0 Nml Nml   Right PronatorTeres Median C6-7 Nml Nml Nml Nml Nml 0 Nml Nml   Right Biceps Musculocut C5-6 Nml Nml Nml Nml Nml 0 Nml Nml   Right Deltoid Axillary C5-6 Nml Nml Nml Nml Nml 0 Nml Nml   Left 1stDorInt Ulnar C8-T1 Nml Nml Nml Nml Nml 0 Nml Nml   Left Abd Poll Brev Median C8-T1 Nml Nml Nml Nml Nml 0 Nml Nml   Left ExtDigCom   Nml Nml Nml Nml Nml 0 Nml Nml   Left Triceps Radial C6-7-8 Nml Nml Nml Nml Nml 0 Nml Nml   Left Deltoid Axillary C5-6 Nml Nml Nml Nml Nml 0 Nml Nml     Nerve Conduction Studies Anti Sensory Left/Right Comparison   Stim Site L Lat (ms) R Lat (ms) L-R Lat (ms) L Amp (V) R Amp (V) L-R Amp (%) Site1 Site2 L Vel (m/s) R Vel (m/s) L-R Vel (m/s)  Median Acr Palm Anti Sensory (2nd Digit)  33C  Wrist *4.2 *4.1 0.1 17.6 15.9 9.7 Wrist Palm     Palm *2.3 *2.1 0.2 22.8 17.9  21.5       Radial Anti Sensory (Base 1st Digit)  32.1C  Wrist 2.0   22.4   Wrist Base 1st Digit     Ulnar Anti Sensory (5th Digit)  32.9C  Wrist 3.4   *1.0   Wrist 5th Digit 41     Motor Left/Right Comparison   Stim Site L Lat (ms) R Lat (ms) L-R Lat (ms) L Amp (mV) R Amp (mV) L-R Amp (%) Site1 Site2 L Vel (m/s) R Vel (m/s) L-R Vel (m/s)  Median Motor (Abd Poll Brev)  32.4C  Wrist *4.4 4.0 0.4 5.2 6.0 13.3 Elbow Wrist *47 *47 0  Elbow 9.3 9.0 0.3 2.2 5.5 60.0       Ulnar Motor (Abd Dig Min)  32.7C  Wrist 2.5   7.6   B Elbow Wrist 53    B Elbow 6.5   4.2   A Elbow B Elbow 53    A Elbow 8.4   4.0            Waveforms:

## 2023-06-02 ENCOUNTER — Ambulatory Visit
Admission: RE | Admit: 2023-06-02 | Discharge: 2023-06-02 | Disposition: A | Discharge status: Home / Self Care | Payer: BC Managed Care – PPO | Source: Ambulatory Visit | Attending: Family Medicine | Admitting: Family Medicine

## 2023-06-02 ENCOUNTER — Other Ambulatory Visit: Payer: Self-pay | Admitting: Family Medicine

## 2023-06-02 DIAGNOSIS — R509 Fever, unspecified: Secondary | ICD-10-CM | POA: Diagnosis not present

## 2023-06-02 DIAGNOSIS — R059 Cough, unspecified: Secondary | ICD-10-CM | POA: Diagnosis not present

## 2023-06-08 ENCOUNTER — Emergency Department (HOSPITAL_BASED_OUTPATIENT_CLINIC_OR_DEPARTMENT_OTHER): Payer: BC Managed Care – PPO | Admitting: Radiology

## 2023-06-08 ENCOUNTER — Other Ambulatory Visit: Payer: Self-pay

## 2023-06-08 ENCOUNTER — Emergency Department (HOSPITAL_BASED_OUTPATIENT_CLINIC_OR_DEPARTMENT_OTHER)
Admission: EM | Admit: 2023-06-08 | Discharge: 2023-06-09 | Disposition: A | Discharge status: Home / Self Care | Payer: BC Managed Care – PPO | Attending: Emergency Medicine | Admitting: Emergency Medicine

## 2023-06-08 ENCOUNTER — Encounter (HOSPITAL_BASED_OUTPATIENT_CLINIC_OR_DEPARTMENT_OTHER): Payer: Self-pay | Admitting: Emergency Medicine

## 2023-06-08 DIAGNOSIS — J189 Pneumonia, unspecified organism: Secondary | ICD-10-CM | POA: Diagnosis not present

## 2023-06-08 DIAGNOSIS — R0600 Dyspnea, unspecified: Secondary | ICD-10-CM | POA: Diagnosis not present

## 2023-06-08 DIAGNOSIS — Z1152 Encounter for screening for COVID-19: Secondary | ICD-10-CM | POA: Insufficient documentation

## 2023-06-08 DIAGNOSIS — R0602 Shortness of breath: Secondary | ICD-10-CM | POA: Diagnosis not present

## 2023-06-08 DIAGNOSIS — R509 Fever, unspecified: Secondary | ICD-10-CM | POA: Diagnosis not present

## 2023-06-08 DIAGNOSIS — R059 Cough, unspecified: Secondary | ICD-10-CM | POA: Diagnosis not present

## 2023-06-08 DIAGNOSIS — R918 Other nonspecific abnormal finding of lung field: Secondary | ICD-10-CM | POA: Diagnosis not present

## 2023-06-08 DIAGNOSIS — R051 Acute cough: Secondary | ICD-10-CM | POA: Diagnosis not present

## 2023-06-08 LAB — BASIC METABOLIC PANEL
Anion gap: 10 (ref 5–15)
BUN: 30 mg/dL — ABNORMAL HIGH (ref 8–23)
CO2: 24 mmol/L (ref 22–32)
Calcium: 9 mg/dL (ref 8.9–10.3)
Chloride: 103 mmol/L (ref 98–111)
Creatinine, Ser: 0.86 mg/dL (ref 0.61–1.24)
GFR, Estimated: >60 mL/min (ref >60)
Glucose, Bld: 100 mg/dL — ABNORMAL HIGH (ref 70–99)
Potassium: 3.6 mmol/L (ref 3.5–5.1)
Sodium: 137 mmol/L (ref 135–145)

## 2023-06-08 LAB — CBC WITH DIFFERENTIAL/PLATELET
Abs Immature Granulocytes: 0.05 10*3/uL (ref 0.00–0.07)
Basophils Absolute: 0 10*3/uL (ref 0.0–0.1)
Basophils Relative: 0 %
Eosinophils Absolute: 0 10*3/uL (ref 0.0–0.5)
Eosinophils Relative: 0 %
HCT: 41.7 % (ref 39.0–52.0)
Hemoglobin: 14.8 g/dL (ref 13.0–17.0)
Immature Granulocytes: 1 %
Lymphocytes Relative: 16 %
Lymphs Abs: 1.6 10*3/uL (ref 0.7–4.0)
MCH: 31.6 pg (ref 26.0–34.0)
MCHC: 35.5 g/dL (ref 30.0–36.0)
MCV: 88.9 fL (ref 80.0–100.0)
Monocytes Absolute: 1 10*3/uL (ref 0.1–1.0)
Monocytes Relative: 10 %
Neutro Abs: 7.3 10*3/uL (ref 1.7–7.7)
Neutrophils Relative %: 73 %
Platelets: 198 10*3/uL (ref 150–400)
RBC: 4.69 MIL/uL (ref 4.22–5.81)
RDW: 12.3 % (ref 11.5–15.5)
WBC: 10.1 10*3/uL (ref 4.0–10.5)
nRBC: 0 % (ref 0.0–0.2)

## 2023-06-08 NOTE — ED Triage Notes (Signed)
Cough sob x 2 weeks. Treated with Abt, neb, tamiflu Seen at [facility] sent for eval  More SOB with activity. Fever 102 at home

## 2023-06-08 NOTE — ED Provider Notes (Signed)
[place] EMERGENCY DEPARTMENT AT [facility] Provider Note   CSN: [id number] Arrival date & time: 06/08/23  2227     History {Add pertinent medical, surgical, social history, OB history to HPI:1} Chief Complaint  Patient presents with   Shortness of Breath    Harry Cobb is a 62 y.o. male.  HPI     This is a 62 year old male who presents with cough and shortness of breath.  Patient reports he has had symptoms for at least 12 days at this point.  He has been in contact with his primary physician and has had a course of azithromycin as well as prednisone, cough syrup, and Tamiflu.  He was also given a nebulizer.  Patient states over the last 24 hours he has had fevers up to 102.8.  He does report he had a chest x-ray 4 days ago that was "normal."  During this time he has not had any further testing including COVID or influenza testing.  At this point he states he has been unable to sleep secondary to ongoing cough.  He was seen at urgent care and referred to the emergency room as his oxygen levels were borderline low in the low 90s.  Denies history of COPD or smoking.  Home Medications Prior to Admission medications   Medication Sig Start Date End Date Taking? Authorizing Provider  diclofenac (VOLTAREN) 75 MG EC tablet Take 75 mg by mouth 2 (two) times daily. 07/27/21   [provider]  fexofenadine (ALLEGRA ALLERGY) 60 MG tablet Take 60 mg by mouth 2 (two) times daily.    [provider]  methocarbamol (ROBAXIN) 500 MG tablet Take 1 tablet (500 mg total) by mouth every 8 (eight) hours as needed for muscle spasms. 05/03/22   Magnant, Joycie Peek, PA-C      Allergies    Patient has no known allergies.    Review of Systems   Review of Systems  Constitutional:  Positive for fatigue and fever.  Respiratory:  Positive for cough and shortness of breath.   Cardiovascular:  Negative for chest pain.  Gastrointestinal:  Negative for abdominal pain.   Genitourinary:  Negative for dysuria.  All other systems reviewed and are negative.   Physical Exam Updated Vital Signs BP 125/84   Pulse 63   Temp 98.6 F (37 C)   Resp 17   Ht 1.829 m (6')   Wt 91.6 kg   SpO2 91%   BMI 27.40 kg/m  Physical Exam Vitals and nursing note reviewed.  Constitutional:      Appearance: He is well-developed.  HENT:     Head: Normocephalic and atraumatic.  Eyes:     Pupils: Pupils are equal, round, and reactive to light.  Cardiovascular:     Rate and Rhythm: Normal rate and regular rhythm.     Heart sounds: Normal heart sounds. No murmur heard. Pulmonary:     Effort: Pulmonary effort is normal. No respiratory distress.     Breath sounds: Normal breath sounds. No wheezing.     Comments: Rhonchorous breath sounds were left lower lobe Abdominal:     General: Bowel sounds are normal.     Palpations: Abdomen is soft.     Tenderness: There is no abdominal tenderness. There is no rebound.  Musculoskeletal:     Cervical back: Neck supple.  Lymphadenopathy:     Cervical: No cervical adenopathy.  Skin:    General: Skin is warm and dry.  Neurological:  Mental Status: He is alert and oriented to person, place, and time.  Psychiatric:        Mood and Affect: Mood normal.     ED Results / Procedures / Treatments   Labs (all labs ordered are listed, but only abnormal results are displayed) Labs Reviewed  BASIC METABOLIC PANEL - Abnormal; Notable for the following components:      Result Value   Glucose, Bld 100 (*)    BUN 30 (*)    All other components within normal limits  RESP PANEL BY RT-PCR (RSV, FLU A&B, COVID)  [id number]  CBC WITH DIFFERENTIAL/PLATELET    EKG None  Radiology No results found.  Procedures Procedures  {Document cardiac monitor, telemetry assessment procedure when appropriate:1}  Medications Ordered in ED Medications - No data to display  ED Course/ Medical Decision Making/ A&P   {   Click here for ABCD2,  HEART and other calculatorsREFRESH Note before signing :1}                              Medical Decision Making Amount and/or Complexity of Data Reviewed Labs: ordered. Radiology: ordered.   ***  {Document critical care time when appropriate:1} {Document review of labs and clinical decision tools ie heart score, Chads2Vasc2 etc:1}  {Document your independent review of radiology images, and any outside records:1} {Document your discussion with family members, caretakers, and with consultants:1} {Document social determinants of health affecting pt's care:1} {Document your decision making why or why not admission, treatments were needed:1} Final Clinical Impression(s) / ED Diagnoses Final diagnoses:  None    Rx / DC Orders ED Discharge Orders     None

## 2023-06-08 NOTE — ED Notes (Signed)
Patient transported to X-ray 

## 2023-06-08 NOTE — ED Notes (Signed)
Pt had tylenol around 9pm

## 2023-06-09 LAB — RESP PANEL BY RT-PCR (RSV, FLU A&B, COVID)  RVPGX2
Influenza A by PCR: NEGATIVE
Influenza B by PCR: NEGATIVE
Resp Syncytial Virus by PCR: NEGATIVE
SARS Coronavirus 2 by RT PCR: NEGATIVE

## 2023-06-09 MED ORDER — AMOXICILLIN-POT CLAVULANATE 875-125 MG PO TABS: 1.0000 | ORAL_TABLET | Freq: Two times a day (BID) | ORAL | 0 refills | Status: DC

## 2023-06-09 MED ORDER — SODIUM CHLORIDE 0.9 % IV SOLN
1.0000 g | Freq: Once | INTRAVENOUS | Status: AC
  Administered 2023-06-09: 1 g via INTRAVENOUS
  Filled 2023-06-09: qty 10

## 2023-06-09 MED ORDER — HYDROXYZINE HCL 25 MG PO TABS: 50.0000 mg | ORAL_TABLET | Freq: Every evening | ORAL | 0 refills | Status: AC | PRN

## 2023-06-09 NOTE — Discharge Instructions (Addendum)
You were seen today for ongoing cough and respiratory symptoms.  Your x-ray does show findings consistent with likely pneumonia.  Given that you have finished a course of azithromycin, we will start you on Augmentin.  Trial Atarax for sleep at night.  Continue your cough medications at home.  If you have new or worsening symptoms, you should be reevaluated.

## 2023-06-09 NOTE — ED Notes (Signed)
Patient ambulated around unit with this RT. Lowest SpO2 93%. No distress noted while ambulating. EDP aware.

## 2023-06-14 DIAGNOSIS — J189 Pneumonia, unspecified organism: Secondary | ICD-10-CM | POA: Diagnosis not present

## 2023-06-22 DIAGNOSIS — L03011 Cellulitis of right finger: Secondary | ICD-10-CM | POA: Diagnosis not present

## 2023-06-26 DIAGNOSIS — L03011 Cellulitis of right finger: Secondary | ICD-10-CM | POA: Diagnosis not present

## 2023-06-27 DIAGNOSIS — S60410A Abrasion of right index finger, initial encounter: Secondary | ICD-10-CM | POA: Diagnosis not present

## 2023-06-27 DIAGNOSIS — L03011 Cellulitis of right finger: Secondary | ICD-10-CM | POA: Diagnosis not present

## 2023-06-27 DIAGNOSIS — M79642 Pain in left hand: Secondary | ICD-10-CM | POA: Diagnosis not present

## 2023-06-27 DIAGNOSIS — L089 Local infection of the skin and subcutaneous tissue, unspecified: Secondary | ICD-10-CM | POA: Diagnosis not present

## 2023-06-28 DIAGNOSIS — L03011 Cellulitis of right finger: Secondary | ICD-10-CM | POA: Diagnosis not present

## 2023-06-29 DIAGNOSIS — L03011 Cellulitis of right finger: Secondary | ICD-10-CM | POA: Diagnosis not present

## 2023-07-03 DIAGNOSIS — L03011 Cellulitis of right finger: Secondary | ICD-10-CM | POA: Diagnosis not present

## 2023-07-05 DIAGNOSIS — S60410A Abrasion of right index finger, initial encounter: Secondary | ICD-10-CM | POA: Diagnosis not present

## 2023-07-05 DIAGNOSIS — L089 Local infection of the skin and subcutaneous tissue, unspecified: Secondary | ICD-10-CM | POA: Diagnosis not present

## 2023-07-05 DIAGNOSIS — R2231 Localized swelling, mass and lump, right upper limb: Secondary | ICD-10-CM | POA: Diagnosis not present

## 2023-07-13 ENCOUNTER — Other Ambulatory Visit: Payer: Self-pay | Admitting: Orthopedic Surgery

## 2023-07-13 DIAGNOSIS — L089 Local infection of the skin and subcutaneous tissue, unspecified: Secondary | ICD-10-CM | POA: Diagnosis not present

## 2023-07-13 DIAGNOSIS — S60410A Abrasion of right index finger, initial encounter: Secondary | ICD-10-CM | POA: Diagnosis not present

## 2023-07-13 DIAGNOSIS — R2231 Localized swelling, mass and lump, right upper limb: Secondary | ICD-10-CM | POA: Diagnosis not present

## 2023-07-13 DIAGNOSIS — M65841 Other synovitis and tenosynovitis, right hand: Secondary | ICD-10-CM | POA: Diagnosis not present

## 2023-07-13 DIAGNOSIS — R9389 Abnormal findings on diagnostic imaging of other specified body structures: Secondary | ICD-10-CM | POA: Diagnosis not present

## 2023-07-14 ENCOUNTER — Other Ambulatory Visit: Payer: Self-pay

## 2023-07-14 ENCOUNTER — Encounter (HOSPITAL_BASED_OUTPATIENT_CLINIC_OR_DEPARTMENT_OTHER): Payer: Self-pay | Admitting: Orthopedic Surgery

## 2023-07-18 ENCOUNTER — Ambulatory Visit (HOSPITAL_BASED_OUTPATIENT_CLINIC_OR_DEPARTMENT_OTHER)
Admission: RE | Admit: 2023-07-18 | Discharge: 2023-07-18 | Disposition: A | Discharge status: Home / Self Care | Payer: BC Managed Care – PPO | Attending: Orthopedic Surgery | Admitting: Orthopedic Surgery

## 2023-07-18 ENCOUNTER — Encounter (HOSPITAL_BASED_OUTPATIENT_CLINIC_OR_DEPARTMENT_OTHER)
Admission: RE | Disposition: A | Discharge status: Home / Self Care | Payer: Self-pay | Source: Home / Self Care | Attending: Orthopedic Surgery

## 2023-07-18 ENCOUNTER — Ambulatory Visit (HOSPITAL_BASED_OUTPATIENT_CLINIC_OR_DEPARTMENT_OTHER): Payer: Self-pay | Admitting: Anesthesiology

## 2023-07-18 ENCOUNTER — Encounter (HOSPITAL_BASED_OUTPATIENT_CLINIC_OR_DEPARTMENT_OTHER): Payer: Self-pay | Admitting: Orthopedic Surgery

## 2023-07-18 ENCOUNTER — Other Ambulatory Visit: Payer: Self-pay

## 2023-07-18 DIAGNOSIS — M86641 Other chronic osteomyelitis, right hand: Secondary | ICD-10-CM | POA: Diagnosis not present

## 2023-07-18 DIAGNOSIS — R2231 Localized swelling, mass and lump, right upper limb: Secondary | ICD-10-CM | POA: Diagnosis not present

## 2023-07-18 DIAGNOSIS — Z01818 Encounter for other preprocedural examination: Secondary | ICD-10-CM

## 2023-07-18 DIAGNOSIS — L859 Epidermal thickening, unspecified: Secondary | ICD-10-CM | POA: Diagnosis not present

## 2023-07-18 DIAGNOSIS — M71341 Other bursal cyst, right hand: Secondary | ICD-10-CM | POA: Diagnosis not present

## 2023-07-18 HISTORY — PX: INCISION AND DRAINAGE: SHX5863

## 2023-07-18 HISTORY — PX: EXCISION METACARPAL MASS: SHX6372

## 2023-07-18 SURGERY — EXCISION METACARPAL MASS
Anesthesia: Monitor Anesthesia Care | Site: Finger | Laterality: Right

## 2023-07-18 MED ORDER — ACETAMINOPHEN 500 MG PO TABS: 1000.0000 mg | ORAL_TABLET | Freq: Once | ORAL | Status: DC

## 2023-07-18 MED ORDER — ACETAMINOPHEN 160 MG/5ML PO SOLN
325.0000 mg | ORAL | Status: DC | PRN
Start: 2023-07-18 — End: 2023-07-18

## 2023-07-18 MED ORDER — ACETAMINOPHEN 325 MG PO TABS: 325.0000 mg | ORAL_TABLET | ORAL | Status: DC | PRN

## 2023-07-18 MED ORDER — DOXYCYCLINE HYCLATE 50 MG PO CAPS
100.0000 mg | ORAL_CAPSULE | Freq: Two times a day (BID) | ORAL | 0 refills | Status: AC
Start: 2023-07-18 — End: ?

## 2023-07-18 MED ORDER — CELECOXIB 200 MG PO CAPS: 200.0000 mg | ORAL_CAPSULE | Freq: Once | ORAL | Status: DC

## 2023-07-18 MED ORDER — SODIUM CHLORIDE 0.9 % IV SOLN: INTRAVENOUS | Status: DC | PRN

## 2023-07-18 MED ORDER — OXYCODONE HCL 5 MG PO TABS
5.0000 mg | ORAL_TABLET | Freq: Once | ORAL | Status: DC | PRN
Start: 2023-07-18 — End: 2023-07-18

## 2023-07-18 MED ORDER — FENTANYL CITRATE (PF) 100 MCG/2ML IJ SOLN
INTRAMUSCULAR | Status: AC
  Filled 2023-07-18: qty 2

## 2023-07-18 MED ORDER — BUPIVACAINE HCL (PF) 0.25 % IJ SOLN
INTRAMUSCULAR | Status: DC | PRN
  Administered 2023-07-18: 9 mL

## 2023-07-18 MED ORDER — LACTATED RINGERS IV SOLN: INTRAVENOUS | Status: DC

## 2023-07-18 MED ORDER — KETOROLAC TROMETHAMINE 30 MG/ML IJ SOLN
INTRAMUSCULAR | Status: DC | PRN
  Administered 2023-07-18: 30 mg via INTRAVENOUS

## 2023-07-18 MED ORDER — OXYCODONE HCL 5 MG/5ML PO SOLN: 5.0000 mg | Freq: Once | ORAL | Status: DC | PRN

## 2023-07-18 MED ORDER — HYDROCODONE-ACETAMINOPHEN 5-325 MG PO TABS: ORAL_TABLET | ORAL | 0 refills | Status: AC

## 2023-07-18 MED ORDER — ONDANSETRON HCL 4 MG/2ML IJ SOLN
4.0000 mg | Freq: Once | INTRAMUSCULAR | Status: DC | PRN
Start: 2023-07-18 — End: 2023-07-18

## 2023-07-18 MED ORDER — ONDANSETRON HCL 4 MG/2ML IJ SOLN: 4.0000 mg | Freq: Once | INTRAMUSCULAR | Status: DC | PRN

## 2023-07-18 MED ORDER — 0.9 % SODIUM CHLORIDE (POUR BTL) OPTIME
TOPICAL | Status: DC | PRN
  Administered 2023-07-18: 50 mL

## 2023-07-18 MED ORDER — MIDAZOLAM HCL 2 MG/2ML IJ SOLN
INTRAMUSCULAR | Status: AC
Start: 2023-07-18 — End: ?
  Filled 2023-07-18: qty 2

## 2023-07-18 MED ORDER — OXYCODONE HCL 5 MG PO TABS: 5.0000 mg | ORAL_TABLET | Freq: Once | ORAL | Status: DC | PRN

## 2023-07-18 MED ORDER — FENTANYL CITRATE (PF) 100 MCG/2ML IJ SOLN
INTRAMUSCULAR | Status: DC | PRN
  Administered 2023-07-18 (×2): 50 ug via INTRAVENOUS

## 2023-07-18 MED ORDER — PROPOFOL 10 MG/ML IV BOLUS
INTRAVENOUS | Status: DC | PRN
  Administered 2023-07-18: 150 ug/kg/min via INTRAVENOUS
  Administered 2023-07-18: 100 mg via INTRAVENOUS

## 2023-07-18 MED ORDER — MEPERIDINE HCL 25 MG/ML IJ SOLN: 6.2500 mg | INTRAMUSCULAR | Status: DC | PRN

## 2023-07-18 MED ORDER — MIDAZOLAM HCL 5 MG/5ML IJ SOLN
INTRAMUSCULAR | Status: DC | PRN
  Administered 2023-07-18: 2 mg via INTRAVENOUS

## 2023-07-18 MED ORDER — FENTANYL CITRATE (PF) 100 MCG/2ML IJ SOLN: 25.0000 ug | INTRAMUSCULAR | Status: DC | PRN

## 2023-07-18 MED ORDER — ACETAMINOPHEN 160 MG/5ML PO SOLN: 325.0000 mg | ORAL | Status: DC | PRN

## 2023-07-18 MED ORDER — DEXMEDETOMIDINE HCL IN NACL 80 MCG/20ML IV SOLN
INTRAVENOUS | Status: DC | PRN
  Administered 2023-07-18: 20 ug via INTRAVENOUS

## 2023-07-18 SURGICAL SUPPLY — 56 items
BAG DECANTER FOR FLEXI CONT (MISCELLANEOUS) IMPLANT
BANDAGE GAUZE 1X75IN STRL (MISCELLANEOUS) IMPLANT
BENZOIN TINCTURE PRP APPL 2/3 (GAUZE/BANDAGES/DRESSINGS) IMPLANT
BLADE MINI RND TIP GREEN BEAV (BLADE) IMPLANT
BLADE SURG 15 STRL LF DISP TIS (BLADE) ×2 IMPLANT
BNDG COHESIVE 1X5 TAN STRL LF (GAUZE/BANDAGES/DRESSINGS) IMPLANT
BNDG COHESIVE 2X5 TAN ST LF (GAUZE/BANDAGES/DRESSINGS) IMPLANT
BNDG ELASTIC 2INX 5YD STR LF (GAUZE/BANDAGES/DRESSINGS) IMPLANT
BNDG ELASTIC 3INX 5YD STR LF (GAUZE/BANDAGES/DRESSINGS) IMPLANT
BNDG ESMARK 4X9 LF (GAUZE/BANDAGES/DRESSINGS) IMPLANT
BNDG GAUZE 1X75IN STRL (MISCELLANEOUS)
BNDG GAUZE DERMACEA FLUFF 4 (GAUZE/BANDAGES/DRESSINGS) IMPLANT
BNDG PLASTER X FAST 3X3 WHT LF (CAST SUPPLIES) IMPLANT
CHLORAPREP W/TINT 26 (MISCELLANEOUS) ×1 IMPLANT
CORD BIPOLAR FORCEPS 12FT (ELECTRODE) ×1 IMPLANT
COVER BACK TABLE 60X90IN (DRAPES) ×1 IMPLANT
COVER MAYO STAND STRL (DRAPES) ×1 IMPLANT
CUFF TOURN SGL QUICK 18X4 (TOURNIQUET CUFF) ×1 IMPLANT
DRAPE EXTREMITY T 121X128X90 (DISPOSABLE) ×1 IMPLANT
DRAPE SURG 17X23 STRL (DRAPES) ×1 IMPLANT
GAUZE PACKING IODOFORM 1/4X15 (PACKING) IMPLANT
GAUZE SPONGE 4X4 12PLY STRL (GAUZE/BANDAGES/DRESSINGS) ×1 IMPLANT
GAUZE STRETCH 2X75IN STRL (MISCELLANEOUS) IMPLANT
GAUZE XEROFORM 1X8 LF (GAUZE/BANDAGES/DRESSINGS) ×1 IMPLANT
GLOVE BIO SURGEON STRL SZ7.5 (GLOVE) ×1 IMPLANT
GLOVE BIOGEL PI IND STRL 7.0 (GLOVE) IMPLANT
GLOVE BIOGEL PI IND STRL 8 (GLOVE) ×1 IMPLANT
GOWN STRL REUS W/ TWL LRG LVL3 (GOWN DISPOSABLE) ×1 IMPLANT
GOWN STRL REUS W/TWL XL LVL3 (GOWN DISPOSABLE) ×1 IMPLANT
LOOP VASCLR MAXI BLUE 18IN ST (MISCELLANEOUS) IMPLANT
NDL BLUNT 17GA (NEEDLE) IMPLANT
NDL HYPO 25X1 1.5 SAFETY (NEEDLE) ×1 IMPLANT
NEEDLE BLUNT 17GA (NEEDLE)
NEEDLE HYPO 25X1 1.5 SAFETY (NEEDLE) ×1
NS IRRIG 1000ML POUR BTL (IV SOLUTION) ×1 IMPLANT
PACK BASIN DAY SURGERY FS (CUSTOM PROCEDURE TRAY) ×1 IMPLANT
PAD CAST 3X4 CTTN HI CHSV (CAST SUPPLIES) IMPLANT
PAD CAST 4YDX4 CTTN HI CHSV (CAST SUPPLIES) IMPLANT
PADDING CAST ABS COTTON 4X4 ST (CAST SUPPLIES) ×1 IMPLANT
SPLINT PLASTER CAST XFAST 3X15 (CAST SUPPLIES) IMPLANT
STOCKINETTE 4X48 STRL (DRAPES) ×1 IMPLANT
STRIP CLOSURE SKIN 1/2X4 (GAUZE/BANDAGES/DRESSINGS) IMPLANT
SUT ETHILON 3 0 PS 1 (SUTURE) IMPLANT
SUT ETHILON 4 0 PS 2 18 (SUTURE) ×1 IMPLANT
SWAB COLLECTION DEVICE MRSA (MISCELLANEOUS) IMPLANT
SWAB CULTURE ESWAB REG 1ML (MISCELLANEOUS) IMPLANT
SYR 10ML LL (SYRINGE) IMPLANT
SYR 20ML LL LF (SYRINGE) IMPLANT
SYR BULB EAR ULCER 3OZ GRN STR (SYRINGE) ×1 IMPLANT
SYR CONTROL 10ML LL (SYRINGE) ×1 IMPLANT
SYR TOOMEY 50ML (SYRINGE) IMPLANT
TIE VASCULAR MAXI BLUE 18IN ST (MISCELLANEOUS)
TOWEL GREEN STERILE FF (TOWEL DISPOSABLE) ×2 IMPLANT
TUBE NG 5FR 35IN ENFIT (TUBING) IMPLANT
UNDERPAD 30X36 HEAVY ABSORB (UNDERPADS AND DIAPERS) ×1 IMPLANT
VASCULAR TIE MAXI BLUE 18IN ST (MISCELLANEOUS)

## 2023-07-18 NOTE — Anesthesia Procedure Notes (Signed)
Procedure Name: MAC Date/Time: 07/18/2023 3:20 PM  Performed by: Yolanda Bonine, CRNAPre-anesthesia Checklist: Patient identified, Emergency Drugs available, Suction available, Patient being monitored and Timeout performed Patient Re-evaluated:Patient Re-evaluated prior to induction Oxygen Delivery Method: Simple face mask Induction Type: IV induction

## 2023-07-18 NOTE — Anesthesia Preprocedure Evaluation (Addendum)
Anesthesia Evaluation  Patient identified by MRN, date of birth, ID band Patient awake    Reviewed: Allergy & Precautions, NPO status , Patient's Chart, lab work & pertinent test results  History of Anesthesia Complications Negative for: history of anesthetic complications  Airway Mallampati: III  TM Distance: >3 FB Neck ROM: Full    Dental   Pulmonary neg pulmonary ROS   Pulmonary exam normal        Cardiovascular negative cardio ROS Normal cardiovascular exam     Neuro/Psych  Neuromuscular disease negative neurological ROS     GI/Hepatic Neg liver ROS, PUD,GERD  ,,  Endo/Other  negative endocrine ROS    Renal/GU negative Renal ROS  negative genitourinary   Musculoskeletal  (+) Arthritis ,    Abdominal   Peds  Hematology negative hematology ROS (+)   Anesthesia Other Findings   Reproductive/Obstetrics                             Anesthesia Physical Anesthesia Plan  ASA: 2  Anesthesia Plan: MAC and Bier Block and Bier Block-Lidocaine Only   Post-op Pain Management: Tylenol PO (pre-op) and Celebrex PO (pre-op)*   Induction: Intravenous  PONV Risk Score and Plan: 1 and Propofol infusion, Treatment may vary due to age or medical condition, Ondansetron, TIVA and Dexamethasone  Airway Management Planned: Nasal Cannula and Simple Face Mask  Additional Equipment: None  Intra-op Plan:   Post-operative Plan: Extubation in OR  Informed Consent: I have reviewed the patients History and Physical, chart, labs and discussed the procedure including the risks, benefits and alternatives for the proposed anesthesia with the patient or authorized representative who has indicated his/her understanding and acceptance.       Plan Discussed with: Anesthesiologist and CRNA  Anesthesia Plan Comments:         Anesthesia Quick Evaluation

## 2023-07-18 NOTE — Discharge Instructions (Addendum)
[facility] Instructions Hand Surgery  Wound Care: Keep your hand elevated above the level of your heart.  Do not allow it to dangle by your side.  Keep the dressing dry and do not remove it unless your doctor advises you to do so.  He will usually change it at the time of your post-op visit.  Moving your fingers is advised to stimulate circulation but will depend on the site of your surgery.  If you have a splint applied, your doctor will advise you regarding movement.  Activity: Do not drive or operate machinery today.  Rest today and then you may return to your normal activity and work as indicated by your physician.  Diet:  Drink liquids today or eat a light diet.  You may resume a regular diet tomorrow.    General expectations: Pain for two to three days. Fingers may become slightly swollen.  Call your doctor if any of the following occur: Severe pain not relieved by pain medication. Elevated temperature. Dressing soaked with blood. Inability to move fingers. Post Anesthesia Home Care Instructions  Activity: Get plenty of rest for the remainder of the day. A responsible individual must stay with you for 24 hours following the procedure.  For the next 24 hours, DO NOT: -Drive a car -Advertising copywriter -Drink alcoholic beverages -Take any medication unless instructed by your physician -Make any legal decisions or sign important papers.  Meals: Start with liquid foods such as gelatin or soup. Progress to regular foods as tolerated. Avoid greasy, spicy, heavy foods. If nausea and/or vomiting occur, drink only clear liquids until the nausea and/or vomiting subsides. Call your physician if vomiting continues.  Special Instructions/Symptoms: Your throat may feel dry or sore from the anesthesia or the breathing tube placed in your throat during surgery. If this causes discomfort, gargle with warm salt water. The discomfort should disappear within 24 hours.  If you had a scopolamine  patch placed behind your ear for the management of post- operative nausea and/or vomiting:  1. The medication in the patch is effective for 72 hours, after which it should be removed.  Wrap patch in a tissue and discard in the trash. Wash hands thoroughly with soap and water. 2. You may remove the patch earlier than 72 hours if you experience unpleasant side effects which may include dry mouth, dizziness or visual disturbances. 3. Avoid touching the patch. Wash your hands with soap and water after contact with the patch.       White or bluish color to fingers.

## 2023-07-18 NOTE — Anesthesia Postprocedure Evaluation (Signed)
Anesthesia Post Note  Patient: JAYVEON CONVEY  Procedure(s) Performed: RIGHT LONG FINGER EXCISION MASS/CYST (Right: Finger) INCISION AND DRAINAGE (Right: Finger)     Patient location during evaluation: PACU Anesthesia Type: MAC and Bier Block Level of consciousness: awake and alert and oriented Pain management: pain level controlled Vital Signs Assessment: post-procedure vital signs reviewed and stable Respiratory status: spontaneous breathing, nonlabored ventilation and respiratory function stable Cardiovascular status: stable and blood pressure returned to baseline Postop Assessment: no apparent nausea or vomiting Anesthetic complications: no   No notable events documented.  Last Vitals:  Vitals:   07/18/23 1615 07/18/23 1627  BP: 116/79 112/83  Pulse: (!) 49 (!) 49  Resp: 17 18  Temp:  (!) 36.2 C  SpO2: 95% 92%    Last Pain:  Vitals:   07/18/23 1627  TempSrc: Temporal  PainSc: 0-No pain                 Shalee Paolo A.

## 2023-07-18 NOTE — Anesthesia Procedure Notes (Signed)
Anesthesia Regional Block: Bier block (IV Regional)   Pre-Anesthetic Checklist: , timeout performed,  Correct Patient, Correct Site, Correct Laterality,  Correct Procedure, Correct Position, site marked,  Risks and benefits discussed,  Surgical consent,  Pre-op evaluation,  At surgeon's request  Laterality: Right  Prep: Maximum Sterile Barrier Precautions used, chloraprep       Needles:  Injection technique: Single-shot      Additional Needles:   Procedures:,,,,,, Esmarch exsanguination  (24g IV Placed.)   Narrative:  Start time: 07/18/2023 3:20 PM End time: 07/18/2023 3:28 PM  Additional Notes: 30 ml 0.5% injected for Bier Block (Right UE).

## 2023-07-18 NOTE — H&P (Signed)
Harry Cobb is an 63 y.o. male.   Chief Complaint: finger mass HPI: 63 yo male with fluid collection/mass volar aspect right long finer at proximal finger flexion crease.  Has had recurrent bouts of swelling and pain in the finger. He wishes to proceed with surgical excision vs I&D of the mass/fluid collection.  Allergies: No Known Allergies  Past Medical History:  Diagnosis Date   Arthritis    Back pain    radiates into the left posterior thigh to the calf.   GERD (gastroesophageal reflux disease)    History of kidney stones    Left shoulder pain    Leg pain    Worse than back pain   Lumbar foraminal stenosis    No pertinent past medical history    Radiculopathy, lumbar region     Past Surgical History:  Procedure Laterality Date   ABDOMINAL EXPOSURE N/A 06/23/2020   Procedure: ABDOMINAL EXPOSURE;  Surgeon: Cephus Shelling, MD;  Location: [facility] OR;  Service: Vascular;  Laterality: N/A;   ANTERIOR LUMBAR FUSION N/A 06/23/2020   Procedure: Anterior Lumbar Interbody Fusion - Lumbar five-Sacral one;  Surgeon: Tia Alert, MD;  Location: [facility] OR;  Service: Neurosurgery;  Laterality: N/A;   arm surgery  2023   BACK SURGERY  2009   BICEPS TENDON REPAIR  2011   EYE SURGERY     Lasik   I & D KNEE WITH POLY EXCHANGE Left 06/09/2021   Procedure: IRRIGATION AND DEBRIDEMENT KNEE WITH POLY REVISION;  Surgeon: Durene Romans, MD;  Location: [facility] ORS;  Service: Orthopedics;  Laterality: Left;   JOINT REPLACEMENT     both partial knee replacements   KNEE ARTHROSCOPY  04/2011   lt   KNEE SURGERY  2009/2011   Rt and Lt knee replacment   LASER SPINE SURGERY     SCAR REVISION Left 08/03/2016   Procedure: Left hip open scar debridement ;  Surgeon: Durene Romans, MD;  Location: [facility] ORS;  Service: Orthopedics;  Laterality: Left;   shoulder cyst  06/04/2011   right cyst removed   TONSILLECTOMY     TOTAL HIP ARTHROPLASTY Left 08/12/2015   Procedure: LEFT TOTAL HIP ARTHROPLASTY ANTERIOR  APPROACH;  Surgeon: Durene Romans, MD;  Location: [facility] OR[facility];  Service: Orthopedics;  Laterality: Left;   TOTAL KNEE ARTHROPLASTY Left 11/20/2012   Procedure: CONVERT LEFT UNI KNEE ARTHROPLASTY TO LEFT TOTAL KNEE ARTHROPLASTY;  Surgeon: Shelda Pal, MD;  Location: [facility] OR[facility];  Service: Orthopedics;  Laterality: Left;   TOTAL KNEE REVISION Left 04/28/2021   Procedure: TOTAL KNEE REVISION;  Surgeon: Durene Romans, MD;  Location: [facility];  Service: Orthopedics;  Laterality: Left;   TOTAL KNEE REVISION WITH SCAR DEBRIDEMENT/PATELLA REVISION WITH POLY EXCHANGE Left 08/03/2016   Procedure: Left knee open scar debridement, poly exchange ;  Surgeon: Durene Romans, MD;  Location: [facility] OR[facility];  Service: Orthopedics;  Laterality: Left;   UMBILICAL HERNIA REPAIR  06/04/2011   Procedure: HERNIA REPAIR UMBILICAL ADULT;  Surgeon: Emelia Loron, MD;  Location:  S[facility]ERY CENTER;  Service: General;  Laterality: N/A;  umbilical hernia repiar and mesh and removal of lympoma on right shoulder    Family History: History reviewed. No pertinent family history.  Social History:   reports that he has never smoked. He has never used smokeless tobacco. He reports current alcohol use. He reports that he does not use drugs.  Medications: Medications Prior to Admission  Medication Sig Dispense Refill   diclofenac (VOLTAREN) 75 MG EC  tablet Take 75 mg by mouth 2 (two) times daily.     fexofenadine (ALLEGRA ALLERGY) 60 MG tablet Take 60 mg by mouth 2 (two) times daily.     methocarbamol (ROBAXIN) 500 MG tablet Take 1 tablet (500 mg total) by mouth every 8 (eight) hours as needed for muscle spasms. 30 tablet 1   traMADol HCl 75 MG TABS Take by mouth.     hydrOXYzine (ATARAX) 25 MG tablet Take 2 tablets (50 mg total) by mouth at bedtime as needed. 20 tablet 0    No results found for this or any previous visit (from the past 48 hours).  No results found.    Blood pressure (!) 125/94, pulse (!) 59, temperature 98.4 F  (36.9 C), temperature source Temporal, resp. rate 18, height 6' (1.829 m), weight 93.2 kg, SpO2 99%.  General appearance: alert, cooperative, and appears stated age Head: Normocephalic, without obvious abnormality, atraumatic Neck: supple, symmetrical, trachea midline Extremities: Intact sensation and capillary refill all digits.  +epl/fpl/io.  Small dry wound at proximal finger flexion crease right long finger Skin: Skin color, texture, turgor normal. No rashes or lesions Neurologic: Grossly normal Incision/Wound: none  Assessment/Plan Right long finger mass/fluid collection.  Non operative and operative treatment options have been discussed with the patient and patient wishes to proceed with operative treatment. Risks, benefits, and alternatives of surgery have been discussed and the patient agrees with the plan of care.   Betha Loa 07/18/2023, 1:58 PM

## 2023-07-18 NOTE — Op Note (Signed)
NAME: Harry Cobb MEDICAL RECORD NO: [id number] DATE OF BIRTH: January 22, 1961 FACILITY: [facility] LOCATION: [facility] PHYSICIAN: Tami Ribas, MD   OPERATIVE REPORT   DATE OF PROCEDURE: 07/18/23    PREOPERATIVE DIAGNOSIS: Right long finger mass/fluid collection   POSTOPERATIVE DIAGNOSIS: Right long finger mass/fluid collection   PROCEDURE: Right long finger excision mass, subcutaneous, 12 mm   SURGEON:  Betha Loa, M.D.   ASSISTANT: none   ANESTHESIA:  Bier block with sedation   INTRAVENOUS FLUIDS:  Per anesthesia flow sheet.   ESTIMATED BLOOD LOSS:  Minimal.   COMPLICATIONS:  None.   SPECIMENS: Right long finger cultures to micro, mass to pathology   TOURNIQUET TIME:    Total Tourniquet Time Documented: Forearm (Right) - 29 minutes Total: Forearm (Right) - 29 minutes    DISPOSITION:  Stable to PACU.   INDICATIONS: 63 year old male with right long finger palpable mass versus fluid collection.  This becomes inflamed and bothersome to him.  He wishes to have excision of the mass versus drainage of the fluid collection.  Risks, benefits and alternatives of surgery were discussed including the risks of blood loss, infection, damage to nerves, vessels, tendons, ligaments, bone for surgery, need for additional surgery, complications with wound healing, continued pain, stiffness, , recurrence.  He voiced understanding of these risks and elected to proceed.  OPERATIVE COURSE:  After being identified preoperatively by myself,  the patient and I agreed on the procedure and site of the procedure.  The surgical site was marked.  Surgical consent had been signed. Antibiotics were held for intraoperative cultures. He was transferred to the operating room and placed on the operating table in supine position with the right upper extremity on an arm board.  Bier block anesthesia was induced by the anesthesiologist.  Right upper extremity was prepped and draped in normal  sterile orthopedic fashion.  A surgical pause was performed between the surgeons, anesthesia, and operating room staff and all were in agreement as to the patient, procedure, and site of procedure.  Tourniquet at the proximal aspect of the forearm had been inflated for the Bier block.  A Bruner type incision was made over the palpable mass at the level of the proximal finger flexion crease.  This was carried in subcutaneous tissues by spreading technique.  Some dark brownish fluid was encountered.  Cultures were taken.  There was no gross purulence.  The palpable mass was what appeared to be indurated subcutaneous tissues.  This was carefully freed up and removed.  It was sent to pathology for examination.  Measured approximate 12 mm in diameter.  The undersurface of the skin at the location of the chronic wound had a stalk like appearance.  This portion of the skin was excised and sent to pathology as well.  The area was palpated and no remaining mass was noted.  The flexor sheath was intact.  The digital neurovascular bundles were identified and were intact.  The wound was copiously irrigated with sterile saline and closed with 4-0 nylon in a horizontal mattress fashion.  The area of excised skin was able to be closed directly as well.  Digital block was performed with quarter percent plain Marcaine to aid in postoperative analgesia.  The wound was dressed with sterile Xeroform and 4 x 4 and wrapped with a Coban dressing lightly.  The tourniquet was deflated at 29 minutes.  Fingertips were pink with brisk capillary refill after deflation of tourniquet.  The operative  drapes  were broken down.  The patient was awoken from anesthesia safely.  He was transferred back to the stretcher and taken to PACU in stable condition.  I will see him back in the office in 1 week for postoperative followup.  I will give him a prescription for Norco 5/325 1-2 tabs PO q6 hours prn pain, dispense # 15 and doxycycline 100 mg p.o.  twice daily x 7 days.   Betha Loa, MD Electronically signed, 07/18/23

## 2023-07-18 NOTE — Transfer of Care (Signed)
Immediate Anesthesia Transfer of Care Note  Patient: Harry Cobb  Procedure(s) Performed: RIGHT LONG FINGER EXCISION MASS/CYST (Right: Finger) INCISION AND DRAINAGE (Right: Finger)  Patient Location: PACU  Anesthesia Type:MAC  Level of Consciousness: drowsy  Airway & Oxygen Therapy: Patient Spontanous Breathing and Patient connected to face mask oxygen  Post-op Assessment: Report given to RN and Post -op Vital signs reviewed and stable  Post vital signs: Reviewed and stable  Last Vitals:  Vitals Value Taken Time  BP 125/71 07/18/23 1609  Temp    Pulse 48 07/18/23 1611  Resp 16 07/18/23 1611  SpO2 95 % 07/18/23 1611  Vitals shown include unfiled device data.  Last Pain:  Vitals:   07/18/23 1303  TempSrc: Temporal  PainSc: 0-No pain      Patients Stated Pain Goal: 3 (07/18/23 1303)  Complications: No notable events documented.

## 2023-07-19 ENCOUNTER — Encounter (HOSPITAL_BASED_OUTPATIENT_CLINIC_OR_DEPARTMENT_OTHER): Payer: Self-pay | Admitting: Orthopedic Surgery

## 2023-07-20 DIAGNOSIS — L089 Local infection of the skin and subcutaneous tissue, unspecified: Secondary | ICD-10-CM | POA: Diagnosis not present

## 2023-07-20 DIAGNOSIS — S60410A Abrasion of right index finger, initial encounter: Secondary | ICD-10-CM | POA: Diagnosis not present

## 2023-07-20 LAB — SURGICAL PATHOLOGY

## 2023-07-23 LAB — AEROBIC/ANAEROBIC CULTURE W GRAM STAIN (SURGICAL/DEEP WOUND): Culture: NO GROWTH

## 2023-07-29 DIAGNOSIS — R2231 Localized swelling, mass and lump, right upper limb: Secondary | ICD-10-CM | POA: Diagnosis not present

## 2023-08-03 ENCOUNTER — Encounter: Payer: Self-pay | Admitting: Podiatry

## 2023-08-03 ENCOUNTER — Ambulatory Visit (INDEPENDENT_AMBULATORY_CARE_PROVIDER_SITE_OTHER): Payer: BC Managed Care – PPO

## 2023-08-03 ENCOUNTER — Ambulatory Visit (INDEPENDENT_AMBULATORY_CARE_PROVIDER_SITE_OTHER): Payer: BC Managed Care – PPO | Admitting: Podiatry

## 2023-08-03 DIAGNOSIS — M7661 Achilles tendinitis, right leg: Secondary | ICD-10-CM

## 2023-08-03 DIAGNOSIS — M722 Plantar fascial fibromatosis: Secondary | ICD-10-CM | POA: Diagnosis not present

## 2023-08-03 DIAGNOSIS — M7662 Achilles tendinitis, left leg: Secondary | ICD-10-CM

## 2023-08-03 MED ORDER — NITROGLYCERIN 0.2 MG/HR TD PT24: 0.2000 mg | MEDICATED_PATCH | Freq: Every day | TRANSDERMAL | 12 refills | Status: AC

## 2023-08-03 NOTE — Progress Notes (Signed)
Orthotics   Patient was present and evaluated for Custom molded foot orthotics. Patient will benefit from CFO's to provide total contact to BIL MLA's helping to balance and distribute body weight more evenly across BIL feet helping to reduce plantar pressure and pain. Orthotic will also encourage FF / RF alignment  Patient was scanned today and will return for fitting upon receipt

## 2023-08-03 NOTE — Progress Notes (Signed)
Subjective:   Patient ID: Harry Cobb, male   DOB: 63 y.o.   MRN: [id number]   HPI Patient presents stating that he has chronic heel pain but has developed a lot of pain in his Achilles tendon of both feet.  States that it is hurting near where the tendon becomes muscle.  Patient states left worse than right   ROS      Objective:  Physical Exam  Neurovascular status intact patient is very active plays a lot of tennis has a lot of discomfort in the posterior heel region left over right with the pain mostly at the muscle tendon junction     Assessment:  Acute and also chronic Achilles tendinitis left with inflammation fluid at the muscle tendon junction     Plan:  H&P x-rays reviewed condition discussed and at this point I dispensed night splint with all instructions on usage and the administration of heat ice therapy and I wrote prescription for nitro patches.  I then casted for functional orthotic devices by pedorthist with heel lifts that are naturally put in an order to lift the heel.  I did discuss about risk of Achilles tendon tear and to be very careful with condition  X-rays dated today indicated that there is small spur formation no indication stress fracture or other pathology

## 2023-08-15 DIAGNOSIS — R2231 Localized swelling, mass and lump, right upper limb: Secondary | ICD-10-CM | POA: Diagnosis not present

## 2023-09-05 ENCOUNTER — Ambulatory Visit: Payer: BC Managed Care – PPO

## 2023-09-05 NOTE — Progress Notes (Signed)
 Patient presents today to pick up custom molded foot orthotics, diagnosed with Achilles bursitis by Dr. Charlsie Merles.   Orthotics were dispensed and fit was satisfactory. Reviewed instructions for break-in and wear. Written instructions given to patient.  Patient will follow up as needed.   Harry Cobb Cped, CFo, CFm

## 2023-10-12 DIAGNOSIS — R2231 Localized swelling, mass and lump, right upper limb: Secondary | ICD-10-CM | POA: Diagnosis not present

## 2023-10-12 DIAGNOSIS — M65352 Trigger finger, left little finger: Secondary | ICD-10-CM | POA: Diagnosis not present

## 2023-10-26 ENCOUNTER — Ambulatory Visit (INDEPENDENT_AMBULATORY_CARE_PROVIDER_SITE_OTHER): Admitting: Sports Medicine

## 2023-10-26 ENCOUNTER — Encounter: Payer: Self-pay | Admitting: Sports Medicine

## 2023-10-26 DIAGNOSIS — G8929 Other chronic pain: Secondary | ICD-10-CM

## 2023-10-26 DIAGNOSIS — Z96652 Presence of left artificial knee joint: Secondary | ICD-10-CM

## 2023-10-26 DIAGNOSIS — L905 Scar conditions and fibrosis of skin: Secondary | ICD-10-CM | POA: Diagnosis not present

## 2023-10-26 DIAGNOSIS — M25562 Pain in left knee: Secondary | ICD-10-CM

## 2023-10-26 DIAGNOSIS — S76211A Strain of adductor muscle, fascia and tendon of right thigh, initial encounter: Secondary | ICD-10-CM

## 2023-10-26 NOTE — Progress Notes (Signed)
 Harry Cobb - 63 y.o. male MRN [id number]  Date of birth: Mar 22, 1961  Office Visit Note: Visit Date: 10/26/2023 PCP: Rey Catholic, MD Referred by: Rey Catholic, MD  Subjective: Chief Complaint  Patient presents with   Right Hip - Pain   HPI: CORDARREL REGALBUTO "Harry Cobb" is a very pleasant 63 y.o. male who presents today for right groin and left quad/knee pain (s/p TKA).   Eric underwent left total knee arthroplasty at [facility] that unfortunately had an infection and underwent total knee revision on 04/2021.  Even since his first surgery he has had some lingering pain over the superior aspect of the knee in the distal quadricep region.  About 3 weeks ago he felt a pull in the right groin when he was playing tennis.  He has done a good job that treating this conservatively at home with ice/heat, stretching, some rehab exercises and wearing compression with tennis.  He has pulled back on some of his play but now that his pain is feeling better he would like some help with augmentation of healing and further instruction.  He is a very fit and active individual, tennis and pickleball player.  Pertinent ROS were reviewed with the patient and found to be negative unless otherwise specified above in HPI.   Assessment & Plan: Visit Diagnoses:  1. Strain of groin, right, initial encounter   2. Chronic knee pain after total replacement of left knee joint   3. Scar tissue    Plan: Impression is healing right groin injury emanating from the proximal and mid adductor region.  He has been performing activity modification, gentle stretching with improvement in his pain.  We discussed further tendon healing and blood flow augmentation proceeding with extracorporeal shockwave therapy to the adductor region, patient tolerated well.  Given that he has no pain with normal activity, I would like him to focus on strengthening and stabilization exercises of the groin, handout was provided for him today.  He  has had chronic left knee pain after knee replacement and subsequent revision.  He does have some scar tissue and dimpling in the superior aspect.  We did trial shockwave therapy to this location as well.  I would like to see him back in about 1 week and repeat shockwave for both conditions and see what sort of cumulative benefit he has going forward.  Follow-up: Return in about 1 week (around 11/02/2023) for R-groin, L-knee in next 7-10 days.   Meds & Orders: No orders of the defined types were placed in this encounter.  No orders of the defined types were placed in this encounter.    Procedures: Procedure: ECSWT Indications:  Adductor strain   Procedure Details Consent: Risks of procedure as well as the alternatives and risks of each were explained to the patient.  Verbal consent for procedure obtained. Time Out: Verified patient identification, verified procedure, site was marked, verified correct patient position. The area was cleaned with alcohol  swab.     The right adductor (longus/magnus/brevis) tendon was targeted for Extracorporeal shockwave therapy.    Preset: Status post muscular injury Power Level: 100-110 mJ Frequency: 12 Impulse/cycles: 2800 Head size: Regular  The left distal quadricep and superior knee/scar tissue was targeted for Extracorporeal shockwave therapy.    Preset: Status post muscular injury/Scar tissue Power Level: 100 mJ Frequency: 12 Impulse/cycles: 3000 Head size: Regular   Patient tolerated procedure well without immediate complications.        Clinical History: No specialty comments available.  He reports that he has never smoked. He has never used smokeless tobacco. No results for input(s): "HGBA1C", "LABURIC" in the last 8760 hours.  Objective:   Physical Exam  Gen: Well-appearing, in no acute distress; non-toxic CV: Well-perfused. Warm.  Resp: Breathing unlabored on room air; no wheezing. Psych: Fluid speech in conversation; appropriate  affect; normal thought process  Ortho Exam - Right groin: There is some generalized TTP with deep palpation over the proximal to mid belly of the adductor longus and brevis location.  There is some ecchymosis noted in this location.  Full range of motion of the hip with internal and external logroll.  Good preserved strength with clamshell strength testing.  - Left knee: There is a well-healed surgical incision from prior TKA.  There is some skin dimpling at the superior aspect of the scar.  Range of motion is limited from 0-95 degrees.  Imaging:  12/03/22: AP lateral radiographs left knee reviewed.  Well aligned revision type  prostheses in the tibia and femur are present.  Cone is present on the  tibia with press-fit diaphyseal engaging tibial stem.  There is resorption  versus incomplete seating of the tibial tray.  No prior radiographs  available for comparison.  No evidence of any instability lucency or  malalignment whatsoever in the tibial component.  Femoral component also  in good position and alignment with no complicating features.  Patella is  more sclerotic than expected which could be potentially related to early  AVN.  There are several ossicles along the superior lateral aspect of the  patella.   Past Medical/Family/Surgical/Social History: Medications & Allergies reviewed per EMR, new medications updated. 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    PUD (peptic ulcer disease)    Infection of prosthetic left knee joint (HCC) 06/09/2021   S/P revision of total knee, left 04/28/2021   S/P lumbar fusion 06/23/2020   DDD (degenerative disc disease), cervical 12/13/2019   Lumbar post-laminectomy syndrome 12/13/2019   Degeneration of lumbar intervertebral disc 12/13/2019   Impingement  syndrome of left shoulder region 06/23/2018   Pain in joint of left shoulder 04/26/2018   Low back pain 08/24/2017   Knee stiffness, left 08/03/2016   History of total knee replacement, left 08/12/2015   Expected blood loss anemia, postoperative 11/21/2012   Overweight (BMI 25.0-29.9) 11/21/2012   S/P left UKA to TKA 11/20/2012   Umbilical hernia 12/24/2010   Past Medical History:  Diagnosis Date   Arthritis    Back pain    radiates into the left posterior thigh to the calf.   GERD (gastroesophageal reflux disease)    History of kidney stones    Left shoulder pain    Leg pain    Worse than back pain   Lumbar foraminal stenosis    No pertinent past medical history    Radiculopathy, lumbar region    History reviewed. No pertinent family history. Past Surgical History:  Procedure Laterality Date   ABDOMINAL EXPOSURE N/A 06/23/2020   Procedure: ABDOMINAL EXPOSURE;  Surgeon: Young Hensen, MD;  Location: [facility] OR;  Service: Vascular;  Laterality: N/A;   ANTERIOR LUMBAR FUSION N/A 06/23/2020   Procedure: Anterior Lumbar Interbody Fusion - Lumbar five-Sacral one;  Surgeon: Isadora Mar, MD;  Location: [facility] OR;  Service: Neurosurgery;  Laterality: N/A;  arm surgery  2023   BACK SURGERY  2009   BICEPS TENDON REPAIR  2011   EXCISION METACARPAL MASS Right 07/18/2023   Procedure: RIGHT LONG FINGER EXCISION MASS/CYST;  Surgeon: Brunilda Capra, MD;  Location: [facility];  Service: Orthopedics;  Laterality: Right;  Bier block   EYE SURGERY     Lasik   I & D KNEE WITH POLY EXCHANGE Left 06/09/2021   Procedure: IRRIGATION AND DEBRIDEMENT KNEE WITH POLY REVISION;  Surgeon: Claiborne Crew, MD;  Location: [facility] ORS;  Service: Orthopedics;  Laterality: Left;   INCISION AND DRAINAGE Right 07/18/2023   Procedure: INCISION AND DRAINAGE;  Surgeon: Brunilda Capra, MD;  Location: [facility];  Service: Orthopedics;  Laterality: Right;  Bier block   JOINT REPLACEMENT     both  partial knee replacements   KNEE ARTHROSCOPY  04/2011   lt   KNEE SURGERY  2009/2011   Rt and Lt knee replacment   LASER SPINE SURGERY     SCAR REVISION Left 08/03/2016   Procedure: Left hip open scar debridement ;  Surgeon: Claiborne Crew, MD;  Location: [facility] ORS;  Service: Orthopedics;  Laterality: Left;   shoulder cyst  06/04/2011   right cyst removed   TONSILLECTOMY     TOTAL HIP ARTHROPLASTY Left 08/12/2015   Procedure: LEFT TOTAL HIP ARTHROPLASTY ANTERIOR APPROACH;  Surgeon: Claiborne Crew, MD;  Location: [facility];  Service: Orthopedics;  Laterality: Left;   TOTAL KNEE ARTHROPLASTY Left 11/20/2012   Procedure: CONVERT LEFT UNI KNEE ARTHROPLASTY TO LEFT TOTAL KNEE ARTHROPLASTY;  Surgeon: Bevin Bucks, MD;  Location: [facility];  Service: Orthopedics;  Laterality: Left;   TOTAL KNEE REVISION Left 04/28/2021   Procedure: TOTAL KNEE REVISION;  Surgeon: Claiborne Crew, MD;  Location: [facility];  Service: Orthopedics;  Laterality: Left;   TOTAL KNEE REVISION WITH SCAR DEBRIDEMENT/PATELLA REVISION WITH POLY EXCHANGE Left 08/03/2016   Procedure: Left knee open scar debridement, poly exchange ;  Surgeon: Claiborne Crew, MD;  Location: [facility];  Service: Orthopedics;  Laterality: Left;   UMBILICAL HERNIA REPAIR  06/04/2011   Procedure: HERNIA REPAIR UMBILICAL ADULT;  Surgeon: Enid Harry, MD;  Location:  [facility]ERY [facility];  Service: General;  Laterality: N/A;  umbilical hernia repiar and mesh and removal of lympoma on right shoulder   Social History   Occupational History   Not on file  Tobacco Use   Smoking status: Never   Smokeless tobacco: Never  Vaping Use   Vaping status: Never Used  Substance and Sexual Activity   Alcohol  use: Yes    Comment: occassionally   Drug use: Never   Sexual activity: Not on file

## 2023-10-26 NOTE — Progress Notes (Signed)
 Patient says that he felt a pull in his groin when playing tennis a few weeks ago. He says that it has gotten better, although he still feels it. He says that shockwave therapy was recommended to him. He does have some bigger tennis matches coming up and would like to feel well enough to play.  Patient says that he has had both of his knees replaced, although is left knee has never felt as good as the right. He is having some pain in the left quad that has not gotten better with other modalities or dry needling. He is inquiring about potential relief with shockwave therapy for the left knee, as well.

## 2023-11-02 ENCOUNTER — Ambulatory Visit (INDEPENDENT_AMBULATORY_CARE_PROVIDER_SITE_OTHER): Admitting: Sports Medicine

## 2023-11-02 ENCOUNTER — Encounter: Payer: Self-pay | Admitting: Sports Medicine

## 2023-11-02 DIAGNOSIS — M7662 Achilles tendinitis, left leg: Secondary | ICD-10-CM

## 2023-11-02 DIAGNOSIS — S76211D Strain of adductor muscle, fascia and tendon of right thigh, subsequent encounter: Secondary | ICD-10-CM

## 2023-11-02 DIAGNOSIS — G8929 Other chronic pain: Secondary | ICD-10-CM

## 2023-11-02 DIAGNOSIS — M25562 Pain in left knee: Secondary | ICD-10-CM | POA: Diagnosis not present

## 2023-11-02 DIAGNOSIS — Z96652 Presence of left artificial knee joint: Secondary | ICD-10-CM

## 2023-11-02 DIAGNOSIS — L905 Scar conditions and fibrosis of skin: Secondary | ICD-10-CM | POA: Diagnosis not present

## 2023-11-02 NOTE — Progress Notes (Signed)
 Harry Cobb - 63 y.o. male MRN [id number]  Date of birth: 1960/10/22  Office Visit Note: Visit Date: 11/02/2023 PCP: Rey Catholic, MD Referred by: Rey Catholic, MD  Subjective: Chief Complaint  Patient presents with   Right Hip - Follow-up   Left Knee - Follow-up   HPI: Harry Cobb is a pleasant 63 y.o. male who presents today for follow-up of right groin, left knee, new eval for L-achilles.  Right groin -this feels great.  Right after our first shockwave treatment he felt like he could move/gliding without any difficulty or hesitancy.  Having no pain.  Left knee -he tolerated our shockwave treatment well, he has not had a updated range of motion since this.  Still has his baseline discomfort in the knee.  L-achilles - he does have a newer issue with his left Achilles.  He feels a knot and some swelling on the left-hand side.  Denies any specific injury or inciting event.  He still continues to be active playing tennis/pickleball. Has had other tendinopathies in past.  He does use oral diclofenac 75 mg twice daily which he takes daily for arthritic related and chronic inflammatory conditions.  Pertinent ROS were reviewed with the patient and found to be negative unless otherwise specified above in HPI.   Assessment & Plan: Visit Diagnoses:  1. Strain of groin, right, subsequent encounter   2. Chronic knee pain after total replacement of left knee joint   3. Scar tissue   4. Achilles tendinitis, left leg    Plan: Impression is resolution of prior groin strain, received excellent relief from prior shockwave therapy.  He will continue play and active warm up prior to activity.  We did repeat a second treatment for his left knee pain and scar tissue from his previous TKA, would like to see after about 3 treatments if he is making progress and improvement in range of motion.  He is dealing with left Achilles tendinitis with likely superimposed tendinopathy.  We did proceed with  extracorporeal shockwave therapy for this as well.  For all of these conditions, he will continue his oral diclofenac 75 mg twice daily with food.  I would like to bring him back next week to ultrasound the Achilles to see if this is more tendinopathy/tendinosis or if there is any small degree of partial tearing.  We will plan to proceed with additional shockwave and reevaluate the Achilles and left knee pain as well at that visit.  Follow-up: Return for f/u 1-1.5 weeks for US -achilles, and L-knee  Meds & Orders: No orders of the defined types were placed in this encounter.  No orders of the defined types were placed in this encounter.    Procedures: Procedure Details Consent: Risks of procedure as well as the alternatives and risks of each were explained to the patient.  Verbal consent for procedure obtained. Time Out: Verified patient identification, verified procedure, site was marked, verified correct patient position. The area was cleaned with alcohol  swab.     The left achilles tendon was targeted for Extracorporeal shockwave therapy.    Preset: Achillodynia Power Level: 100-110 mJ Frequency: 12 Hz Impulse/cycles: 2500 Head size: Regular   The left distal quadricep and superior knee/scar tissue was targeted for Extracorporeal shockwave therapy.    Preset: Status post muscular injury/Scar tissue Power Level: 110 mJ mJ Frequency: 12 Hz Impulse/cycles: 3000 Head size: Regular   Patient tolerated procedure well without immediate complications.      Clinical History:  No specialty comments available.  He reports that he has never smoked. He has never used smokeless tobacco. No results for input(s): "HGBA1C", "LABURIC" in the last 8760 hours.  Objective:    Physical Exam  Gen: Well-appearing, in no acute distress; non-toxic CV: Well-perfused. Warm.  Resp: Breathing unlabored on room air; no wheezing. Psych: Fluid speech in conversation; appropriate affect; normal thought  process  Ortho Exam - Left knee: Well-healed surgical incision from prior TKA with residual skin dimpling at the superior aspect of the scar.  - Left achilles: There is mid substance thickening and a degree of swelling over the mid aspect of the Achilles.  There is tenderness to palpation here.  No tenderness at the insertion or the superior calcaneus.  There is no step-off or deformity.  Imaging:  *2 view x-rays of the left foot including AP and lateral from from 08/03/2023 was independently reviewed and interpreted by myself today.  X-rays demonstrate a small Haglund deformity of the superior calcaneus.  There is a prominence of the posterior talus.  No acute fracture or significant arthritic change within the midfoot.  Past Medical/Family/Surgical/Social History: Medications & Allergies reviewed per EMR, new medications updated. 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    PUD (peptic ulcer disease)    Infection of prosthetic left knee joint (HCC) 06/09/2021   S/P revision of total knee, left 04/28/2021   S/P lumbar fusion 06/23/2020   DDD (degenerative disc disease), cervical 12/13/2019   Lumbar post-laminectomy syndrome 12/13/2019   Degeneration of lumbar intervertebral disc 12/13/2019   Impingement syndrome of left shoulder region 06/23/2018   Pain in joint of left shoulder 04/26/2018   Low back pain 08/24/2017   Knee stiffness, left 08/03/2016   History of total knee replacement, left 08/12/2015   Expected blood loss anemia, postoperative 11/21/2012   Overweight (BMI 25.0-29.9) 11/21/2012   S/P left UKA to TKA 11/20/2012   Umbilical hernia 12/24/2010   Past Medical History:  Diagnosis Date   Arthritis    Back pain    radiates into the left posterior thigh to the calf.   GERD  (gastroesophageal reflux disease)    History of kidney stones    Left shoulder pain    Leg pain    Worse than back pain   Lumbar foraminal stenosis    No pertinent past medical history    Radiculopathy, lumbar region    History reviewed. No pertinent family history. Past Surgical History:  Procedure Laterality Date   ABDOMINAL EXPOSURE N/A 06/23/2020   Procedure: ABDOMINAL EXPOSURE;  Surgeon: Young Hensen, MD;  Location: [facility] OR;  Service: Vascular;  Laterality: N/A;   ANTERIOR LUMBAR FUSION N/A 06/23/2020   Procedure: Anterior Lumbar Interbody Fusion - Lumbar five-Sacral one;  Surgeon: Isadora Mar, MD;  Location: [facility] OR;  Service: Neurosurgery;  Laterality: N/A;   arm surgery  2023   BACK SURGERY  2009   BICEPS TENDON REPAIR  2011   EXCISION METACARPAL MASS Right 07/18/2023   Procedure: RIGHT LONG FINGER EXCISION MASS/CYST;  Surgeon: Brunilda Capra, MD;  Location: [facility];  Service: Orthopedics;  Laterality: Right;  Bier block   EYE SURGERY     Lasik   I & D KNEE WITH POLY EXCHANGE Left 06/09/2021   Procedure: IRRIGATION AND DEBRIDEMENT KNEE WITH POLY  REVISION;  Surgeon: Claiborne Crew, MD;  Location: [facility];  Service: Orthopedics;  Laterality: Left;   INCISION AND DRAINAGE Right 07/18/2023   Procedure: INCISION AND DRAINAGE;  Surgeon: Brunilda Capra, MD;  Location: [facility];  Service: Orthopedics;  Laterality: Right;  Bier block   JOINT REPLACEMENT     both partial knee replacements   KNEE ARTHROSCOPY  04/2011   lt   KNEE SURGERY  2009/2011   Rt and Lt knee replacment   LASER SPINE SURGERY     SCAR REVISION Left 08/03/2016   Procedure: Left hip open scar debridement ;  Surgeon: Claiborne Crew, MD;  Location: [facility];  Service: Orthopedics;  Laterality: Left;   shoulder cyst  06/04/2011   right cyst removed   TONSILLECTOMY     TOTAL HIP ARTHROPLASTY Left 08/12/2015   Procedure: LEFT TOTAL HIP ARTHROPLASTY ANTERIOR APPROACH;  Surgeon: Claiborne Crew, MD;  Location: [facility];  Service: Orthopedics;  Laterality: Left;   TOTAL KNEE ARTHROPLASTY Left 11/20/2012   Procedure: CONVERT LEFT UNI KNEE ARTHROPLASTY TO LEFT TOTAL KNEE ARTHROPLASTY;  Surgeon: Bevin Bucks, MD;  Location: [facility];  Service: Orthopedics;  Laterality: Left;   TOTAL KNEE REVISION Left 04/28/2021   Procedure: TOTAL KNEE REVISION;  Surgeon: Claiborne Crew, MD;  Location: [facility];  Service: Orthopedics;  Laterality: Left;   TOTAL KNEE REVISION WITH SCAR DEBRIDEMENT/PATELLA REVISION WITH POLY EXCHANGE Left 08/03/2016   Procedure: Left knee open scar debridement, poly exchange ;  Surgeon: Claiborne Crew, MD;  Location: [facility];  Service: Orthopedics;  Laterality: Left;   UMBILICAL HERNIA REPAIR  06/04/2011   Procedure: HERNIA REPAIR UMBILICAL ADULT;  Surgeon: Enid Harry, MD;  Location: [facility];  Service: General;  Laterality: N/A;  umbilical hernia repiar and mesh and removal of lympoma on right shoulder   Social History   Occupational History   Not on file  Tobacco Use   Smoking status: Never   Smokeless tobacco: Never  Vaping Use   Vaping status: Never Used  Substance and Sexual Activity   Alcohol  use: Yes    Comment: occassionally   Drug use: Never   Sexual activity: Not on file

## 2023-11-02 NOTE — Progress Notes (Signed)
 Patient says that his groin feels great. He has not felt any pain or discomfort since the shockwave therapy on the groin.  He is still interested in continuing with the shockwave for his left knee. He says that he did not have his ROM measured in the last week so does not have that marker to determine progress. He is still having pain and discomfort.  Patient mentions concern for his left achilles. He says that his pain feels like other tendinopathy that he has had in the past. He has a "knot" in the achilles on the left side that he does not have or notice in the right side. He does describe his pain more as soreness.

## 2023-11-09 ENCOUNTER — Ambulatory Visit (INDEPENDENT_AMBULATORY_CARE_PROVIDER_SITE_OTHER): Admitting: Sports Medicine

## 2023-11-09 ENCOUNTER — Encounter: Payer: Self-pay | Admitting: Sports Medicine

## 2023-11-09 ENCOUNTER — Other Ambulatory Visit: Payer: Self-pay

## 2023-11-09 DIAGNOSIS — M25562 Pain in left knee: Secondary | ICD-10-CM

## 2023-11-09 DIAGNOSIS — Z96652 Presence of left artificial knee joint: Secondary | ICD-10-CM

## 2023-11-09 DIAGNOSIS — G8929 Other chronic pain: Secondary | ICD-10-CM

## 2023-11-09 DIAGNOSIS — M7662 Achilles tendinitis, left leg: Secondary | ICD-10-CM | POA: Diagnosis not present

## 2023-11-09 DIAGNOSIS — M6788 Other specified disorders of synovium and tendon, other site: Secondary | ICD-10-CM

## 2023-11-09 MED ORDER — METHYLPREDNISOLONE 4 MG PO TBPK: ORAL_TABLET | ORAL | 0 refills | Status: AC

## 2023-11-09 NOTE — Progress Notes (Signed)
 TAYSHAWN PURNELL - 63 y.o. male MRN [id number]  Date of birth: [birth year]  Office Visit Note: Visit Date: 11/09/2023 PCP: Rey Catholic, MD Referred by: Rey Catholic, MD  Subjective: Chief Complaint  Patient presents with   Left Knee - Follow-up   Left Ankle - Follow-up   HPI: QUIRINO KAKOS is a pleasant 63 y.o. male who presents today for follow-up of left achilles, left knee pain s/p TKA.  Left achilles -he has had issues with his in the past with thickening.  Never feels like it is going to give out on him but he more recently has some pain with activity, specific motions with his tennis stroke and push-off.  He is taking more diclofenac 75 mg once to twice daily for this and other arthritic related/inflammatory conditions.  Left knee -we have performed 2 treatments of extracorporeal shockwave therapy, he does feel like it may be slightly better, improved soreness.  Hard to tell if any change in range of motion or pain with activity.  He has seen Lynita Saris, MD for this in the past and did not feel any surgical intervention was warranted.  Pertinent ROS were reviewed with the patient and found to be negative unless otherwise specified above in HPI.   Assessment & Plan: Visit Diagnoses:  1. Achilles tendinitis, left leg   2. Achilles tendinosis of left lower extremity   3. Chronic knee pain after total replacement of left knee joint    Plan: Impression is chronic left Achilles pain with mid substance tendinosis with thickening and superimposed tendinitis.  Ultrasound shows no evidence of any partial tearing however, which is reassuring.  We did perform extracorporeal shockwave therapy for this today.  Activity modification discussed. I would like him to bring in his tennis shoes and insoles at next visit, we may discuss placing him into a small heel lift bilaterally to offload the Achilles.  For the tendinitis/inflammation part, I would like to place him on a 6-day course of a Medrol   Dosepak.  Following this, he may resume his oral diclofenac 75 mg. After completion of the dosepak, would likely be smart to get him into an eccentric and concentric Achilles and posterior chain rehab protocol, we can consider this at next visit.  In terms of his chronic knee pain, we did repeat additional treatment of extracorporeal shockwave therapy to see if this gives him any sort of relief.  From a surgical standpoint, I do not think there is any specific intervention that has been advised. Will see response to ESWT. F/u in the next 8-10 days.   Follow-up: Return in about 10 days (around 11/19/2023) for f/u about 8-10 days for L-achilles/L-knee.   Meds & Orders:  Meds ordered this encounter  Medications   methylPREDNISolone  (MEDROL  DOSEPAK) 4 MG TBPK tablet    Sig: Take per packet instruction. Taper dosing.    Dispense:  1 each    Refill:  0    Orders Placed This Encounter  Procedures   US  Extrem Low Left Ltd     Procedures: Procedure Details Consent: Risks of procedure as well as the alternatives and risks of each were explained to the patient.  Verbal consent for procedure obtained. Time Out: Verified patient identification, verified procedure, site was marked, verified correct patient position. The area was cleaned with alcohol  swab.     The left achilles tendon was targeted for Extracorporeal shockwave therapy.    Preset: Achillodynia Power Level: 120 mJ Frequency: 14 Hz Impulse/cycles:  2500 Head size: Regular   The left distal quadricep and superior knee/scar tissue was targeted for Extracorporeal shockwave therapy.    Preset: Status post muscular injury/Scar tissue Power Level: 110 mJ Frequency: 12 Hz Impulse/cycles: 2600 Head size: Regular   Patient tolerated procedure well without immediate complications.       Clinical History: No specialty comments available.  He reports that he has never smoked. He has never used smokeless tobacco. No results for input(s):  "HGBA1C", "LABURIC" in the last 8760 hours.  Objective:    Physical Exam  Gen: Well-appearing, in no acute distress; non-toxic CV: Well-perfused. Warm.  Resp: Breathing unlabored on room air; no wheezing. Psych: Fluid speech in conversation; appropriate affect; normal thought process  Ortho Exam - Left achilles: There is mid substance notable thickening as well as a small degree of swelling and warmth to the area.  There is no Achilles step-off.  He is able to perform heel raise.  No tenderness at the calcaneus.   - Left knee: Well-healed surgical incision from prior TKA with residual skin dimpling superior aspect of the scar.  There is no significant effusion. ROM 0-95/100 degrees.  Imaging: US  Extrem Low Left Ltd Result Date: 11/09/2023 Limited musculoskeletal ultrasound of the left lower extremity and left Achilles was performed today.  Ultrasound shows notable thickening in the mid substance of the Achilles evaluated both in short and long axis.  Cross-sectional circumference is 5.95 cm and the diameter in a longitudinal long axis plane is 1.32 cm compared to the contralateral right Achilles at the mid substance which is 0.78 cm.  There is notable tendinosis.  There is also a mild to moderate degree of hyperemia indicative of superimposed tendinitis.  There is no evidence of tearing of the Achilles tendon.  There is proper insertion on the calcaneus without abnormality.  No cortical regularity noted.  No retrocalcaneal bursitis.   Midsubstance Achilles tendinosis with tendon thickening and superimposed tendinitis   Past Medical/Family/Surgical/Social History: Medications & Allergies reviewed per EMR, new medications updated. 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    PUD (peptic ulcer disease)     Infection of prosthetic left knee joint (HCC) 06/09/2021   S/P revision of total knee, left 04/28/2021   S/P lumbar fusion 06/23/2020   DDD (degenerative disc disease), cervical 12/13/2019   Lumbar post-laminectomy syndrome 12/13/2019   Degeneration of lumbar intervertebral disc 12/13/2019   Impingement syndrome of left shoulder region 06/23/2018   Pain in joint of left shoulder 04/26/2018   Low back pain 08/24/2017   Knee stiffness, left 08/03/2016   History of total knee replacement, left 08/12/2015   Expected blood loss anemia, postoperative 11/21/2012   Overweight (BMI 25.0-29.9) 11/21/2012   S/P left UKA to TKA 11/20/2012   Umbilical hernia 12/24/2010   Past Medical History:  Diagnosis Date   Arthritis    Back pain    radiates into the left posterior thigh to the calf.   GERD (gastroesophageal reflux disease)    History of kidney stones    Left shoulder pain    Leg pain    Worse than back pain   Lumbar foraminal stenosis    No pertinent past medical history    Radiculopathy, lumbar region    History reviewed. No pertinent family history. Past Surgical History:  Procedure Laterality  Date   ABDOMINAL EXPOSURE N/A 06/23/2020   Procedure: ABDOMINAL EXPOSURE;  Surgeon: Young Hensen, MD;  Location: [facility] OR;  Service: Vascular;  Laterality: N/A;   ANTERIOR LUMBAR FUSION N/A 06/23/2020   Procedure: Anterior Lumbar Interbody Fusion - Lumbar five-Sacral one;  Surgeon: Isadora Mar, MD;  Location: [facility] OR;  Service: Neurosurgery;  Laterality: N/A;   arm surgery  2023   BACK SURGERY  2009   BICEPS TENDON REPAIR  2011   EXCISION METACARPAL MASS Right 07/18/2023   Procedure: RIGHT LONG FINGER EXCISION MASS/CYST;  Surgeon: Brunilda Capra, MD;  Location: [facility];  Service: Orthopedics;  Laterality: Right;  Bier block   EYE SURGERY     Lasik   I & D KNEE WITH POLY EXCHANGE Left 06/09/2021   Procedure: IRRIGATION AND DEBRIDEMENT KNEE WITH POLY REVISION;   Surgeon: Claiborne Crew, MD;  Location: [facility];  Service: Orthopedics;  Laterality: Left;   INCISION AND DRAINAGE Right 07/18/2023   Procedure: INCISION AND DRAINAGE;  Surgeon: Brunilda Capra, MD;  Location: [facility];  Service: Orthopedics;  Laterality: Right;  Bier block   JOINT REPLACEMENT     both partial knee replacements   KNEE ARTHROSCOPY  04/2011   lt   KNEE SURGERY  2009/2011   Rt and Lt knee replacment   LASER SPINE SURGERY     SCAR REVISION Left 08/03/2016   Procedure: Left hip open scar debridement ;  Surgeon: Claiborne Crew, MD;  Location: [facility];  Service: Orthopedics;  Laterality: Left;   shoulder cyst  06/04/2011   right cyst removed   TONSILLECTOMY     TOTAL HIP ARTHROPLASTY Left 08/12/2015   Procedure: LEFT TOTAL HIP ARTHROPLASTY ANTERIOR APPROACH;  Surgeon: Claiborne Crew, MD;  Location: [facility];  Service: Orthopedics;  Laterality: Left;   TOTAL KNEE ARTHROPLASTY Left 11/20/2012   Procedure: CONVERT LEFT UNI KNEE ARTHROPLASTY TO LEFT TOTAL KNEE ARTHROPLASTY;  Surgeon: Bevin Bucks, MD;  Location: [facility];  Service: Orthopedics;  Laterality: Left;   TOTAL KNEE REVISION Left 04/28/2021   Procedure: TOTAL KNEE REVISION;  Surgeon: Claiborne Crew, MD;  Location: [facility];  Service: Orthopedics;  Laterality: Left;   TOTAL KNEE REVISION WITH SCAR DEBRIDEMENT/PATELLA REVISION WITH POLY EXCHANGE Left 08/03/2016   Procedure: Left knee open scar debridement, poly exchange ;  Surgeon: Claiborne Crew, MD;  Location: [facility];  Service: Orthopedics;  Laterality: Left;   UMBILICAL HERNIA REPAIR  06/04/2011   Procedure: HERNIA REPAIR UMBILICAL ADULT;  Surgeon: Enid Harry, MD;  Location: [facility];  Service: General;  Laterality: N/A;  umbilical hernia repiar and mesh and removal of lympoma on right shoulder   Social History   Occupational History   Not on file  Tobacco Use   Smoking status: Never   Smokeless tobacco: Never  Vaping Use   Vaping status:  Never Used  Substance and Sexual Activity   Alcohol  use: Yes    Comment: occassionally   Drug use: Never   Sexual activity: Not on file

## 2023-11-09 NOTE — Progress Notes (Signed)
 Patient states he still taking the Diclofenac. His left knee still has some soreness but is feeling much better. Left achilles is still feeling the same but has days where he he doesn't feel nothing.

## 2023-11-21 ENCOUNTER — Encounter: Payer: Self-pay | Admitting: Sports Medicine

## 2023-11-21 ENCOUNTER — Ambulatory Visit (INDEPENDENT_AMBULATORY_CARE_PROVIDER_SITE_OTHER): Admitting: Sports Medicine

## 2023-11-21 DIAGNOSIS — Z96652 Presence of left artificial knee joint: Secondary | ICD-10-CM

## 2023-11-21 DIAGNOSIS — M25562 Pain in left knee: Secondary | ICD-10-CM | POA: Diagnosis not present

## 2023-11-21 DIAGNOSIS — M6788 Other specified disorders of synovium and tendon, other site: Secondary | ICD-10-CM | POA: Diagnosis not present

## 2023-11-21 DIAGNOSIS — G8929 Other chronic pain: Secondary | ICD-10-CM

## 2023-11-21 NOTE — Progress Notes (Signed)
 Harry Cobb - 63 y.o. male MRN [id number]  Date of birth: 11-10-1960  Office Visit Note: Visit Date: 11/21/2023 PCP: Harry Catholic, MD Referred by: Harry Catholic, MD  Subjective: Chief Complaint  Patient presents with   Left Knee - Follow-up   Left Ankle - Follow-up   HPI: Harry Cobb is a pleasant 63 y.o. male who presents today for follow-up of left achilles, left knee pain s/p TKA.   Left achilles -this is feeling significantly better, had good improvement after his Dosepak.  Was able to play a tennis tournament and did in fact when this tournament.  Left knee - this is what he would like to focus on today, we have performed 2 dedicated shockwave therapies for the knee, 3 in total and unfortunately he has not received much relief from this. He states consistent with his stretching and exercises for the knee but still has pain over the superior lateral aspect of the knee/patellar region.  Pertinent ROS were reviewed with the patient and found to be negative unless otherwise specified above in HPI.   Assessment & Plan: Visit Diagnoses:  1. Chronic knee pain after total replacement of left knee joint   2. Achilles tendinosis of left lower extremity    Plan: Impression is improved midsubstance left Achilles tendinosis with prior superimposed tendinitis.  This improved drastically after 6-day Medrol  Dosepak.  I did provide him 7/16 inch heel lifts to wear in bilateral shoes to help offload the Achilles only when this becomes symptomatic.  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  Will continue his stretching for the posterior cord.  We had a long discussion regarding his chronic knee pain after knee replacement, status post knee prosthesis infection and revision surgery.  He does have some calcification over the superior lateral aspect of the patella but also has quite a bit of sclerotic change within the patella itself.  Discussed this could reflect early or AVN.  Given his previous  infection, would recommend more conservative treatment for the knee but we could at some point pursue a nuclear bone scan to evaluate this further.  Discussed trialing a SAM device for the knee - I did reach out to our [employer] rep, Harry Cobb, who will contact Harry Cobb with details and set him up if desired.  He has completed his Medrol  Dosepak, he may return to his oral diclofenac 75 mg once to twice daily as needed.  Another option would be considering a second surgical opinion (for eval for ossicle excision?) such as seeing Dr. Christiane Cobb or Dr. Hermina Cobb in the future, but I believe all will be conservative and avoiding surgical intervention given his knee history.  Follow-up: Return if symptoms worsen or fail to improve.   Meds & Orders: No orders of the defined types were placed in this encounter.  No orders of the defined types were placed in this encounter.    Procedures: No procedures performed      Clinical History: No specialty comments available.  He reports that he has never smoked. He has never used smokeless tobacco. No results for input(s): "HGBA1C", "LABURIC" in the last 8760 hours.  Objective:    Physical Exam  Gen: Well-appearing, in no acute distress; non-toxic CV: Well-perfused. Warm.  Resp: Breathing unlabored on room air; no wheezing. Psych: Fluid speech in conversation; appropriate affect; normal thought process  Ortho Exam - Left achilles: There is still midsubstance thickening but without any inflammation or warmth to the tendon today.  - Left knee:  Well-healed surgical incision from prior TKA with residual skin dimpling over the superior aspect of the scar.  There is a mild effusion on the knee, range of motion from 0-95 degrees.  There is tenderness with deep palpation over the superior lateral aspect of the patella and just lateral in the soft tissue region here.  Imaging:  *We did review his x-rays in the room today.  12/03/22: AP lateral radiographs left knee reviewed.   Well aligned revision type  prostheses in the tibia and femur are present.  Cone is present on the  tibia with press-fit diaphyseal engaging tibial stem.  There is resorption  versus incomplete seating of the tibial tray.  No prior radiographs  available for comparison.  No evidence of any instability lucency or  malalignment whatsoever in the tibial component.  Femoral component also  in good position and alignment with no complicating features.  Patella is  more sclerotic than expected which could be potentially related to early  AVN.  There are several ossicles along the superior lateral aspect of the  patella.   Past Medical/Family/Surgical/Social History: Medications & Allergies reviewed per EMR, new medications updated. 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    PUD (peptic ulcer disease)    Infection of prosthetic left knee joint (HCC) 06/09/2021   S/P revision of total knee, left 04/28/2021   S/P lumbar fusion 06/23/2020   DDD (degenerative disc disease), cervical 12/13/2019   Lumbar post-laminectomy syndrome 12/13/2019   Degeneration of lumbar intervertebral disc 12/13/2019   Impingement syndrome of left shoulder region 06/23/2018   Pain in joint of left shoulder 04/26/2018   Low back pain 08/24/2017   Knee stiffness, left 08/03/2016   History of total knee replacement, left 08/12/2015   Expected blood loss anemia, postoperative 11/21/2012   Overweight (BMI 25.0-29.9) 11/21/2012   S/P left UKA to TKA 11/20/2012   Umbilical hernia 12/24/2010   Past Medical History:  Diagnosis Date   Arthritis    Back pain    radiates into the left posterior thigh to the calf.   GERD (gastroesophageal reflux disease)    History of kidney stones    Left shoulder pain    Leg pain    Worse than  back pain   Lumbar foraminal stenosis    No pertinent past medical history    Radiculopathy, lumbar region    History reviewed. No pertinent family history. Past Surgical History:  Procedure Laterality Date   ABDOMINAL EXPOSURE N/A 06/23/2020   Procedure: ABDOMINAL EXPOSURE;  Surgeon: Young Hensen, MD;  Location: [facility] OR;  Service: Vascular;  Laterality: N/A;   ANTERIOR LUMBAR FUSION N/A 06/23/2020   Procedure: Anterior Lumbar Interbody Fusion - Lumbar five-Sacral one;  Surgeon: Isadora Mar, MD;  Location: [facility] OR;  Service: Neurosurgery;  Laterality: N/A;   arm surgery  2023   BACK SURGERY  2009   BICEPS TENDON REPAIR  2011   EXCISION METACARPAL MASS Right 07/18/2023   Procedure: RIGHT LONG FINGER EXCISION MASS/CYST;  Surgeon: Brunilda Capra, MD;  Location: [facility];  Service: Orthopedics;  Laterality: Right;  Bier block   EYE SURGERY     Lasik   I & D KNEE WITH POLY EXCHANGE Left 06/09/2021   Procedure: IRRIGATION AND DEBRIDEMENT KNEE WITH POLY REVISION;  Surgeon: Claiborne Crew,  MD;  Location: [facility];  Service: Orthopedics;  Laterality: Left;   INCISION AND DRAINAGE Right 07/18/2023   Procedure: INCISION AND DRAINAGE;  Surgeon: Brunilda Capra, MD;  Location: [facility];  Service: Orthopedics;  Laterality: Right;  Bier block   JOINT REPLACEMENT     both partial knee replacements   KNEE ARTHROSCOPY  04/2011   lt   KNEE SURGERY  2009/2011   Rt and Lt knee replacment   LASER SPINE SURGERY     SCAR REVISION Left 08/03/2016   Procedure: Left hip open scar debridement ;  Surgeon: Claiborne Crew, MD;  Location: [facility];  Service: Orthopedics;  Laterality: Left;   shoulder cyst  06/04/2011   right cyst removed   TONSILLECTOMY     TOTAL HIP ARTHROPLASTY Left 08/12/2015   Procedure: LEFT TOTAL HIP ARTHROPLASTY ANTERIOR APPROACH;  Surgeon: Claiborne Crew, MD;  Location: [facility];  Service: Orthopedics;  Laterality: Left;   TOTAL KNEE ARTHROPLASTY Left 11/20/2012    Procedure: CONVERT LEFT UNI KNEE ARTHROPLASTY TO LEFT TOTAL KNEE ARTHROPLASTY;  Surgeon: Bevin Bucks, MD;  Location: [facility];  Service: Orthopedics;  Laterality: Left;   TOTAL KNEE REVISION Left 04/28/2021   Procedure: TOTAL KNEE REVISION;  Surgeon: Claiborne Crew, MD;  Location: [facility];  Service: Orthopedics;  Laterality: Left;   TOTAL KNEE REVISION WITH SCAR DEBRIDEMENT/PATELLA REVISION WITH POLY EXCHANGE Left 08/03/2016   Procedure: Left knee open scar debridement, poly exchange ;  Surgeon: Claiborne Crew, MD;  Location: [facility];  Service: Orthopedics;  Laterality: Left;   UMBILICAL HERNIA REPAIR  06/04/2011   Procedure: HERNIA REPAIR UMBILICAL ADULT;  Surgeon: Enid Harry, MD;  Location: [facility];  Service: General;  Laterality: N/A;  umbilical hernia repiar and mesh and removal of lympoma on right shoulder   Social History   Occupational History   Not on file  Tobacco Use   Smoking status: Never   Smokeless tobacco: Never  Vaping Use   Vaping status: Never Used  Substance and Sexual Activity   Alcohol  use: Yes    Comment: occassionally   Drug use: Never   Sexual activity: Not on file

## 2023-11-21 NOTE — Progress Notes (Signed)
 Patient says that his achilles is feeling much better. He believes that the Dosepak did a lot to help the achilles, and is open to another shockwave treatment there although does not know that he needs another. He is asking to focus more on the knee today. He says that it is no worse than it has been, but he is still having pain especially when playing tennis. His pain is focused on the area that has been targeted with the shockwave therapy. He also mentions heat in the knee, which he has had since the second knee replacement.

## 2023-11-23 DIAGNOSIS — H04123 Dry eye syndrome of bilateral lacrimal glands: Secondary | ICD-10-CM | POA: Diagnosis not present

## 2023-11-23 DIAGNOSIS — H2513 Age-related nuclear cataract, bilateral: Secondary | ICD-10-CM | POA: Diagnosis not present

## 2024-01-04 DIAGNOSIS — R2231 Localized swelling, mass and lump, right upper limb: Secondary | ICD-10-CM | POA: Diagnosis not present

## 2024-02-03 ENCOUNTER — Encounter: Payer: Self-pay | Admitting: Podiatry

## 2024-02-03 ENCOUNTER — Ambulatory Visit (INDEPENDENT_AMBULATORY_CARE_PROVIDER_SITE_OTHER): Admitting: Podiatry

## 2024-02-03 DIAGNOSIS — M7661 Achilles tendinitis, right leg: Secondary | ICD-10-CM

## 2024-02-03 DIAGNOSIS — M722 Plantar fascial fibromatosis: Secondary | ICD-10-CM

## 2024-02-03 DIAGNOSIS — M7662 Achilles tendinitis, left leg: Secondary | ICD-10-CM

## 2024-02-03 MED ORDER — TRIAMCINOLONE ACETONIDE 10 MG/ML IJ SUSP
10.0000 mg | Freq: Once | INTRAMUSCULAR | Status: AC
  Administered 2024-02-03: 10 mg via INTRA_ARTICULAR

## 2024-02-03 NOTE — Progress Notes (Signed)
 Subjective:   Patient ID: Harry Cobb, male   DOB: 63 y.o.   MRN: [id number]   HPI Patient states he is having a lot of pain in the plantar aspect of his right heel and he is staying active but that this is bothersome and it is hard for him to stretch   ROS      Objective:  Physical Exam  Neurovascular status intact with exquisite discomfort medial fascial band right at the insertional point tendon calcaneus with fluid buildup around the medial band     Assessment:  Acute plantar fasciitis right moderate flatfoot deformity wearing orthotics currently     Plan:  H&P reviewed today I went ahead sterile prep and injected the plantar fascia right 3 mg Kenalog  5 mg Xylocaine  dispensed night splint and also discussed Achilles tendinitis and using it for that appropriately also.  Patient to be seen back as needed signed visit

## 2024-02-06 DIAGNOSIS — G2581 Restless legs syndrome: Secondary | ICD-10-CM | POA: Diagnosis not present

## 2024-02-06 DIAGNOSIS — G478 Other sleep disorders: Secondary | ICD-10-CM | POA: Diagnosis not present

## 2024-02-06 DIAGNOSIS — G4700 Insomnia, unspecified: Secondary | ICD-10-CM | POA: Diagnosis not present

## 2024-02-06 DIAGNOSIS — R0683 Snoring: Secondary | ICD-10-CM | POA: Diagnosis not present

## 2024-02-08 ENCOUNTER — Telehealth: Payer: Self-pay

## 2024-02-08 ENCOUNTER — Telehealth: Payer: Self-pay | Admitting: Podiatry

## 2024-02-08 NOTE — Telephone Encounter (Signed)
 Patient called, interested in Wave Therapy which was mentioned by provider (Dr. Magdalen) at previous visit. Patient contact number, [phone]

## 2024-02-08 NOTE — Telephone Encounter (Signed)
 Patient called and left a message. He had discussed EPAT with Dr. Magdalen at his last office visit. He would like to scheduled for this. Please call patient to schedule -thanks

## 2024-02-13 ENCOUNTER — Ambulatory Visit: Admitting: Cardiovascular Disease

## 2024-02-13 NOTE — Telephone Encounter (Signed)
 Schedule for epat with assistant

## 2024-02-22 DIAGNOSIS — K4020 Bilateral inguinal hernia, without obstruction or gangrene, not specified as recurrent: Secondary | ICD-10-CM | POA: Diagnosis not present

## 2024-02-22 NOTE — Telephone Encounter (Signed)
 Patient called to inquire about Dr. Theron order. EPAT was ordered, and the treatment plan and associated costs were explained. When attempting to schedule, the first available appointment was October 7th. The patient declined, stating he was looking to begin treatment immediately. He stated he will seek treatment elsewhere.

## 2024-03-02 ENCOUNTER — Encounter: Payer: Self-pay | Admitting: Sports Medicine

## 2024-03-02 ENCOUNTER — Ambulatory Visit (INDEPENDENT_AMBULATORY_CARE_PROVIDER_SITE_OTHER): Admitting: Sports Medicine

## 2024-03-02 DIAGNOSIS — M722 Plantar fascial fibromatosis: Secondary | ICD-10-CM | POA: Diagnosis not present

## 2024-03-02 DIAGNOSIS — M2142 Flat foot [pes planus] (acquired), left foot: Secondary | ICD-10-CM

## 2024-03-02 DIAGNOSIS — M6788 Other specified disorders of synovium and tendon, other site: Secondary | ICD-10-CM | POA: Diagnosis not present

## 2024-03-02 DIAGNOSIS — M2141 Flat foot [pes planus] (acquired), right foot: Secondary | ICD-10-CM

## 2024-03-02 NOTE — Progress Notes (Signed)
 Patient says that he has been seen at [facility], as his feet have been bothering him when playing tennis. He says that when his feet feel good, he plays much better. He did have an injection in the right heel, which was temporarily helpful, but did gradually return as he played. He says that the pain in the right foot is more so along the bottom of the heel and arch of the foot, and feels like plantar fasciitis. He says that the left foot feels different than plantar fasciitis, and is along the sides of the foot and sometimes along the top of the foot. He has tried stretches and other home treatments, although these have not given him much relief. He would like to try shockwave therapy for his feet.

## 2024-03-02 NOTE — Progress Notes (Signed)
 Harry Cobb - 63 y.o. male MRN [id number]  Date of birth: [birth year]  Office Visit Note: Visit Date: 03/02/2024 PCP: Hughie Sharper, MD Referred by: Hughie Sharper, MD  Subjective: Chief Complaint  Patient presents with   Right Foot - Pain   Left Foot - Pain   HPI: Harry Cobb is a pleasant 63 y.o. male who presents today for chronic bilateral foot/heel pain.  Left achilles -this has been bothering him and recently playing tennis.  Earlier this past year we did perform a few treatments of extracorporeal shockwave therapy which he noted was quite helpful.  Still using heel lifts in his shoes to help offload the Achilles.  Right PF/plantar foot -he would like to trial shockwave or additional treatments for his plantar fascia. Note reviewed from Dr. Magdalen on 02/03/2024, diagnosed with bilateral plantar fasciitis, he did have a plantar fascia injection for the right heel at that time.  Does have insoles but does not wear this for tennis as he does not find them comfortable.  Uses oral diclofenac 75 mg once to twice daily for this and other arthritic related conditions.  Pertinent ROS were reviewed with the patient and found to be negative unless otherwise specified above in HPI.   Assessment & Plan: Visit Diagnoses:  1. Achilles tendinosis of left lower extremity   2. Plantar fasciitis of right foot   3. Pes planus of both feet    Plan: Impression is acute on chronic left Achilles tendinosis with a mild degree of superimposed tendinitis.  We had ultrasounded this in the past which showed no evidence of tearing but rather notable tendon thickening and tendinosis.  We did perform extracorporeal shockwave therapy for the Achilles today.  I did provide him with both 5/16 and 7/16 inch heel lift to put in his regular shoes and tennis shoes to help offload the Achilles.  He also is happy plantar fascia syndrome of the right foot, previously had an injection which helped for a few weeks and  then his pain slowly returned.  We did perform our first trial of extracorporeal shockwave therapy for this location today.  Given his concomitant pes planus, I would like him to bring in his orthotics at the next visit to further evaluate these and how it supports his foot shape, as he finds the firm ones discomforting.  Could consider referral to [facility] for custom ones if beneficial.  He may continue his oral diclofenac 75 mg once to twice daily.  I would like to see him back next week for repeat shockwave and further evaluation before deciding on additional further treatments going forward.   We discussed muscle and tendon recovery as well as creatine supplementation, I will send in a MyChart message regarding this.  Follow-up: Return in about 1 week (around 03/09/2024) for for L-achilles, R-PF (reg visit).   Meds & Orders: No orders of the defined types were placed in this encounter.  No orders of the defined types were placed in this encounter.    Procedures:  Procedure Details Consent: Risks of procedure as well as the alternatives and risks of each were explained to the patient.  Verbal consent for procedure obtained. Time Out: Verified patient identification, verified procedure, site was marked, verified correct patient position. The area was cleaned with alcohol  swab.     The left achilles tendon was targeted for Extracorporeal shockwave therapy.    Preset: Achillodynia Power Level: 110 mJ Frequency: 12 Hz Impulse/cycles: 2500 Head size: Regular  The right plantar fascia was targeted for Extracorporeal shockwave therapy.    Preset: Plantar fasciiis Power Level: 100-10 mJ Frequency: 11-12 Hz Impulse/cycles: 2500 Head size: Regular  Clinical History: No specialty comments available.  He reports that he has never smoked. He has never used smokeless tobacco. No results for input(s): HGBA1C, LABURIC in the last 8760 hours.  Objective:    Physical Exam  Gen:  Well-appearing, in no acute distress; non-toxic CV: Well-perfused. Warm.  Resp: Breathing unlabored on room air; no wheezing. Psych: Fluid speech in conversation; appropriate affect; normal thought process  Ortho Exam - Bilateral feet/ankles: There is tenderness in the mid aspect of the left Achilles with notable tendinosis and thickening of the tendon.  There is mild soft tissue swelling but no redness.  No tenderness at the base of the calcaneus, no posterior tibial tendon tenderness.  There is positive TTP over the medial aspect of the plantar fascia band on the right foot.  There is moderate pes planus deformity noted.  Imaging:  11/09/23: Limited musculoskeletal ultrasound of the left lower extremity and left  Achilles was performed today.  Ultrasound shows notable thickening in the  mid substance of the Achilles evaluated both in short and long axis.   Cross-sectional circumference is 5.95 cm and the diameter in a  longitudinal long axis plane is 1.32 cm compared to the contralateral  right Achilles at the mid substance which is 0.78 cm.  There is notable  tendinosis.  There is also a mild to moderate degree of hyperemia  indicative of superimposed tendinitis.  There is no evidence of tearing of  the Achilles tendon.  There is proper insertion on the calcaneus without  abnormality.  No cortical regularity noted.  No retrocalcaneal bursitis.   Past Medical/Family/Surgical/Social History: Medications & Allergies reviewed per EMR, new medications updated. 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    PUD (peptic ulcer disease)    Infection of prosthetic left knee joint (HCC) 06/09/2021   S/P revision of total knee, left 04/28/2021   S/P lumbar fusion 06/23/2020   DDD (degenerative disc disease),  cervical 12/13/2019   Lumbar post-laminectomy syndrome 12/13/2019   Degeneration of lumbar intervertebral disc 12/13/2019   Impingement syndrome of left shoulder region 06/23/2018   Pain in joint of left shoulder 04/26/2018   Low back pain 08/24/2017   Knee stiffness, left 08/03/2016   History of total knee replacement, left 08/12/2015   Expected blood loss anemia, postoperative 11/21/2012   Overweight (BMI 25.0-29.9) 11/21/2012   S/P left UKA to TKA 11/20/2012   Umbilical hernia 12/24/2010   Past Medical History:  Diagnosis Date   Arthritis    Back pain    radiates into the left posterior thigh to the calf.   GERD (gastroesophageal reflux disease)    History of kidney stones    Left shoulder pain    Leg pain    Worse than back pain   Lumbar foraminal stenosis    No pertinent past medical history    Radiculopathy, lumbar region    Family History  Problem Relation Age of Onset   Atrial fibrillation Mother    Parkinson's disease Father    Aneurysm Brother    Cancer Maternal Grandmother    Lung disease Maternal Grandfather    Heart disease Maternal Grandfather  Past Surgical History:  Procedure Laterality Date   ABDOMINAL EXPOSURE N/A 06/23/2020   Procedure: ABDOMINAL EXPOSURE;  Surgeon: Gretta Lonni PARAS, MD;  [facility] OR;  Service: Vascular;  Laterality: N/A;   ANTERIOR LUMBAR FUSION N/A 06/23/2020   Procedure: Anterior Lumbar Interbody Fusion - Lumbar five-Sacral one;  Surgeon: Joshua Alm RAMAN, MD;  Location: [facility] OR;  Service: Neurosurgery;  Laterality: N/A;   arm surgery  2023   BACK SURGERY  2009   BICEPS TENDON REPAIR  2011   EXCISION METACARPAL MASS Right 07/18/2023   Procedure: RIGHT LONG FINGER EXCISION MASS/CYST;  Surgeon: Murrell Drivers, MD;  Location: [facility];  Service: Orthopedics;  Laterality: Right;  Bier block   EYE SURGERY     Lasik   I & D KNEE WITH POLY EXCHANGE Left 06/09/2021   Procedure: IRRIGATION AND DEBRIDEMENT KNEE WITH  POLY REVISION;  Surgeon: Ernie Cough, MD;  Location: [facility] ORS;  Service: Orthopedics;  Laterality: Left;   INCISION AND DRAINAGE Right 07/18/2023   Procedure: INCISION AND DRAINAGE;  Surgeon: Murrell Drivers, MD;  Location: [facility];  Service: Orthopedics;  Laterality: Right;  Bier block   JOINT REPLACEMENT     both partial knee replacements   KNEE ARTHROSCOPY  04/2011   lt   KNEE SURGERY  2009/2011   Rt and Lt knee replacment   LASER SPINE SURGERY     SCAR REVISION Left 08/03/2016   Procedure: Left hip open scar debridement ;  Surgeon: Cough Ernie, MD;  Location: [facility];  Service: Orthopedics;  Laterality: Left;   shoulder cyst  06/04/2011   right cyst removed   TONSILLECTOMY     TOTAL HIP ARTHROPLASTY Left 08/12/2015   Procedure: LEFT TOTAL HIP ARTHROPLASTY ANTERIOR APPROACH;  Surgeon: Cough Ernie, MD;  Location: [facility];  Service: Orthopedics;  Laterality: Left;   TOTAL KNEE ARTHROPLASTY Left 11/20/2012   Procedure: CONVERT LEFT UNI KNEE ARTHROPLASTY TO LEFT TOTAL KNEE ARTHROPLASTY;  Surgeon: Cough JONETTA Ernie, MD;  Location: [facility];  Service: Orthopedics;  Laterality: Left;   TOTAL KNEE REVISION Left 04/28/2021   Procedure: TOTAL KNEE REVISION;  Surgeon: Ernie Cough, MD;  Location: [facility];  Service: Orthopedics;  Laterality: Left;   TOTAL KNEE REVISION WITH SCAR DEBRIDEMENT/PATELLA REVISION WITH POLY EXCHANGE Left 08/03/2016   Procedure: Left knee open scar debridement, poly exchange ;  Surgeon: Cough Ernie, MD;  Location: [facility];  Service: Orthopedics;  Laterality: Left;   UMBILICAL HERNIA REPAIR  06/04/2011   Procedure: HERNIA REPAIR UMBILICAL ADULT;  Surgeon: Cough Bury, MD;  Location: [facility];  Service: General;  Laterality: N/A;  umbilical hernia repiar and mesh and removal of lympoma on right shoulder   Social History   Occupational History   Not on file  Tobacco Use   Smoking status: Never   Smokeless tobacco: Never  Vaping Use    Vaping status: Never Used  Substance and Sexual Activity   Alcohol  use: Yes    Comment: occassionally   Drug use: Never   Sexual activity: Not on file

## 2024-03-09 ENCOUNTER — Encounter: Payer: Self-pay | Admitting: Sports Medicine

## 2024-03-09 ENCOUNTER — Ambulatory Visit: Admitting: Sports Medicine

## 2024-03-09 DIAGNOSIS — M6788 Other specified disorders of synovium and tendon, other site: Secondary | ICD-10-CM

## 2024-03-09 DIAGNOSIS — M2142 Flat foot [pes planus] (acquired), left foot: Secondary | ICD-10-CM | POA: Diagnosis not present

## 2024-03-09 DIAGNOSIS — M722 Plantar fascial fibromatosis: Secondary | ICD-10-CM

## 2024-03-09 DIAGNOSIS — M2141 Flat foot [pes planus] (acquired), right foot: Secondary | ICD-10-CM | POA: Diagnosis not present

## 2024-03-09 NOTE — Progress Notes (Signed)
 Patient says that both of his feet are feeling a bit better. He says that the smaller heel lift seems to have been helpful for him when playing tennis, and he feels that he played the best he has all year this past week. He has not tried the bigger heel lift in his other day-to-day shoes. He does mention having some soreness in both hips, and is wondering if that may be because his feet are feeling better and he is playing harder.

## 2024-03-09 NOTE — Progress Notes (Signed)
 Harry Cobb - 63 y.o. male MRN [id number]  Date of birth: 1961/02/06  Office Visit Note: Visit Date: 03/09/2024 PCP: Hughie Sharper, MD Referred by: Hughie Sharper, MD  Subjective: Chief Complaint  Patient presents with   Right Foot - Follow-up   Left Foot - Follow-up   HPI: Harry Cobb is a pleasant 63 y.o. male who presents today for follow-up of left Achilles tendinosis, right heel pain/PF.  Left achilles -this is feeling definitely better.  Feels like there is less swelling.  He has been playing well in tennis and feels he is moving better.  Right heel/plantar fascia -this also is doing better.  Still has tenderness to palpation.  He does have orthotics that he wears but did not bring them today.   Asking about bracing at the ankle for support and prevention. Uses oral diclofenac 75 mg once to twice daily for this and other arthritic related conditions.   Pertinent ROS were reviewed with the patient and found to be negative unless otherwise specified above in HPI.   Assessment & Plan: Visit Diagnoses:  1. Achilles tendinosis of left lower extremity   2. Plantar fasciitis of right foot   3. Pes planus of both feet    Plan: Impression is improving left mid substance Achilles tendinosis.  We did repeat shockwave therapy for this today, tolerated well.  Will wear his heel lifts in everyday shoes, may use the lower lift in his tennis-specific shoes if finds comfortable.  We did measure his ankle today for body he was compression sleeve.  We did proceed with an additional treatment of shockwave therapy for the right plantar fascia today, tolerated well.  Continue stretching routine and orthotics as indicated.  He will continue his oral diclofenac 75 mg once to twice daily as needed.  We will see him back over the next 1-2 weeks for repeat evaluation, he will bring in his orthotics at that visit for us  to evaluate.  Did give him some stretches to perform for the anterior hip and  hip flexors.  Meds & Orders: No orders of the defined types were placed in this encounter.  No orders of the defined types were placed in this encounter.    Procedures:   Procedure Details Consent: Risks of procedure as well as the alternatives and risks of each were explained to the patient.  Verbal consent for procedure obtained. Time Out: Verified patient identification, verified procedure, site was marked, verified correct patient position. The area was cleaned with alcohol  swab.     The left achilles tendon was targeted for Extracorporeal shockwave therapy.    Preset: Achillodynia Power Level: 110-120 mJ   Frequency: 12 Hz Impulse/cycles: 2750 Head size: Regular      The right plantar fascia was targeted for Extracorporeal shockwave therapy.    Preset: Plantar fasciiis Power Level: 110 mJ   Frequency: 12 Hz Impulse/cycles: 2500 Head size: Regular       Clinical History: No specialty comments available.  He reports that he has never smoked. He has never used smokeless tobacco. No results for input(s): HGBA1C, LABURIC in the last 8760 hours.  Objective:    Physical Exam  Gen: Well-appearing, in no acute distress; non-toxic CV: Well-perfused. Warm.  Resp: Breathing unlabored on room air; no wheezing. Psych: Fluid speech in conversation; appropriate affect; normal thought process  Ortho Exam - Bilateral feet: Positive TTP over the medial band of the plantar fascia on the right foot.  No redness or  swelling.  The left Achilles has midsubstance tendinosis with thickening although no appreciable swelling or tendinitis appreciated today.  Imaging: No results found.  Past Medical/Family/Surgical/Social History: Medications & Allergies reviewed per EMR, new medications updated. 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    PUD (peptic ulcer disease)    Infection of prosthetic left knee joint (HCC) 06/09/2021   S/P revision of total knee, left 04/28/2021   S/P lumbar fusion 06/23/2020   DDD (degenerative disc disease), cervical 12/13/2019   Lumbar post-laminectomy syndrome 12/13/2019   Degeneration of lumbar intervertebral disc 12/13/2019   Impingement syndrome of left shoulder region 06/23/2018   Pain in joint of left shoulder 04/26/2018   Low back pain 08/24/2017   Knee stiffness, left 08/03/2016   History of total knee replacement, left 08/12/2015   Expected blood loss anemia, postoperative 11/21/2012   Overweight (BMI 25.0-29.9) 11/21/2012   S/P left UKA to TKA 11/20/2012   Umbilical hernia 12/24/2010   Past Medical History:  Diagnosis Date   Arthritis    Back pain    radiates into the left posterior thigh to the calf.   GERD (gastroesophageal reflux disease)    History of kidney stones    Left shoulder pain    Leg pain    Worse than back pain   Lumbar foraminal stenosis    No pertinent past medical history    Radiculopathy, lumbar region    Family History  Problem Relation Age of Onset   Atrial fibrillation Mother    Parkinson's disease Father    Aneurysm Brother    Cancer Maternal Grandmother    Lung disease Maternal Grandfather    Heart disease Maternal Grandfather    Past Surgical History:  Procedure Laterality Date   ABDOMINAL EXPOSURE N/A 06/23/2020   Procedure: ABDOMINAL EXPOSURE;  Surgeon: Gretta Lonni PARAS, MD;  Location: [facility] OR;  Service: Vascular;  Laterality: N/A;   ANTERIOR LUMBAR FUSION N/A 06/23/2020   Procedure: Anterior Lumbar Interbody Fusion - Lumbar five-Sacral one;  Surgeon: Joshua Alm RAMAN, MD;  Location: [facility] OR;  Service: Neurosurgery;  Laterality: N/A;   arm surgery  2023   BACK SURGERY  2009   BICEPS TENDON REPAIR  2011   EXCISION METACARPAL MASS Right 07/18/2023   Procedure: RIGHT LONG FINGER EXCISION  MASS/CYST;  Surgeon: Murrell Drivers, MD;  Location: [facility];  Service: Orthopedics;  Laterality: Right;  Bier block   EYE SURGERY     Lasik   I & D KNEE WITH POLY EXCHANGE Left 06/09/2021   Procedure: IRRIGATION AND DEBRIDEMENT KNEE WITH POLY REVISION;  Surgeon: Ernie Cough, MD;  Location: [facility];  Service: Orthopedics;  Laterality: Left;   INCISION AND DRAINAGE Right 07/18/2023   Procedure: INCISION AND DRAINAGE;  Surgeon: Murrell Drivers, MD;  Location: [facility];  Service: Orthopedics;  Laterality: Right;  Bier block   JOINT REPLACEMENT     both partial knee replacements   KNEE ARTHROSCOPY  04/2011   lt   KNEE SURGERY  2009/2011   Rt and Lt knee replacment   LASER SPINE SURGERY     SCAR REVISION Left 08/03/2016   Procedure: Left hip open scar debridement ;  Surgeon: Cough Ernie, MD;  Location: [facility];  Service: Orthopedics;  Laterality: Left;   shoulder cyst  06/04/2011  right cyst removed   TONSILLECTOMY     TOTAL HIP ARTHROPLASTY Left 08/12/2015   Procedure: LEFT TOTAL HIP ARTHROPLASTY ANTERIOR APPROACH;  Surgeon: Donnice Car, MD;  Location: [facility];  Service: Orthopedics;  Laterality: Left;   TOTAL KNEE ARTHROPLASTY Left 11/20/2012   Procedure: CONVERT LEFT UNI KNEE ARTHROPLASTY TO LEFT TOTAL KNEE ARTHROPLASTY;  Surgeon: Donnice JONETTA Car, MD;  Location: [facility];  Service: Orthopedics;  Laterality: Left;   TOTAL KNEE REVISION Left 04/28/2021   Procedure: TOTAL KNEE REVISION;  Surgeon: Car Donnice, MD;  Location: [facility];  Service: Orthopedics;  Laterality: Left;   TOTAL KNEE REVISION WITH SCAR DEBRIDEMENT/PATELLA REVISION WITH POLY EXCHANGE Left 08/03/2016   Procedure: Left knee open scar debridement, poly exchange ;  Surgeon: Donnice Car, MD;  Location: [facility];  Service: Orthopedics;  Laterality: Left;   UMBILICAL HERNIA REPAIR  06/04/2011   Procedure: HERNIA REPAIR UMBILICAL ADULT;  Surgeon: Donnice Bury, MD;  Location: [facility];   Service: General;  Laterality: N/A;  umbilical hernia repiar and mesh and removal of lympoma on right shoulder   Social History   Occupational History   Not on file  Tobacco Use   Smoking status: Never   Smokeless tobacco: Never  Vaping Use   Vaping status: Never Used  Substance and Sexual Activity   Alcohol  use: Yes    Comment: occassionally   Drug use: Never   Sexual activity: Not on file

## 2024-04-23 ENCOUNTER — Encounter: Payer: Self-pay | Admitting: Radiology

## 2024-04-23 ENCOUNTER — Ambulatory Visit: Admitting: Cardiovascular Disease

## 2024-07-26 ENCOUNTER — Telehealth: Payer: Self-pay | Admitting: Physical Medicine and Rehabilitation

## 2024-07-26 ENCOUNTER — Telehealth: Payer: Self-pay | Admitting: Sports Medicine

## 2024-07-26 NOTE — Telephone Encounter (Signed)
Patient called. He would like an appointment with Dr. Newton.  

## 2024-07-26 NOTE — Telephone Encounter (Signed)
 Pt request a sooner appt than 08/08/24.  Please call if pt can be worked in

## 2024-08-01 ENCOUNTER — Ambulatory Visit: Admitting: Sports Medicine

## 2024-08-03 ENCOUNTER — Ambulatory Visit: Admitting: Physical Medicine and Rehabilitation

## 2024-08-08 ENCOUNTER — Ambulatory Visit: Admitting: Sports Medicine
